# Patient Record
Sex: Female | Born: 1990 | Race: White | Hispanic: No | Marital: Married | Smoking: Current every day smoker
Health system: Southern US, Community
[De-identification: ages and names within clinical notes are randomized; demographics above are authoritative.]

## PROBLEM LIST (undated history)

## (undated) DIAGNOSIS — Z9621 Cochlear implant status: Secondary | ICD-10-CM

## (undated) DIAGNOSIS — F319 Bipolar disorder, unspecified: Secondary | ICD-10-CM

## (undated) DIAGNOSIS — R34 Anuria and oliguria: Secondary | ICD-10-CM

## (undated) DIAGNOSIS — F603 Borderline personality disorder: Secondary | ICD-10-CM

## (undated) DIAGNOSIS — R87629 Unspecified abnormal cytological findings in specimens from vagina: Secondary | ICD-10-CM

## (undated) DIAGNOSIS — F99 Mental disorder, not otherwise specified: Secondary | ICD-10-CM

## (undated) DIAGNOSIS — H9190 Unspecified hearing loss, unspecified ear: Secondary | ICD-10-CM

## (undated) DIAGNOSIS — F1910 Other psychoactive substance abuse, uncomplicated: Secondary | ICD-10-CM

## (undated) DIAGNOSIS — N3010 Interstitial cystitis (chronic) without hematuria: Secondary | ICD-10-CM

## (undated) HISTORY — DX: Cochlear implant status: Z96.21

## (undated) HISTORY — DX: Anuria and oliguria: R34

## (undated) HISTORY — DX: Unspecified abnormal cytological findings in specimens from vagina: R87.629

## (undated) HISTORY — DX: Borderline personality disorder: F60.3

## (undated) HISTORY — PX: COCHLEAR IMPLANT: SUR684

## (undated) HISTORY — DX: Mental disorder, not otherwise specified: F99

## (undated) HISTORY — DX: Unspecified hearing loss, unspecified ear: H91.90

---
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
## 2010-03-03 ENCOUNTER — Ambulatory Visit: Payer: Self-pay | Admitting: Family Medicine

## 2010-03-22 ENCOUNTER — Encounter (INDEPENDENT_AMBULATORY_CARE_PROVIDER_SITE_OTHER): Payer: Self-pay | Admitting: *Deleted

## 2010-03-22 DIAGNOSIS — Z3480 Encounter for supervision of other normal pregnancy, unspecified trimester: Secondary | ICD-10-CM

## 2010-03-22 LAB — CONVERTED CEMR LAB
Basophils Absolute: 0 10*3/uL (ref 0.0–0.1)
Basophils Relative: 0 % (ref 0–1)
Eosinophils Absolute: 0.1 10*3/uL (ref 0.0–0.7)
Eosinophils Relative: 1 % (ref 0–5)
HCT: 38 % (ref 36.0–46.0)
Hepatitis B Surface Ag: NEGATIVE
Lymphs Abs: 2.3 10*3/uL (ref 0.7–4.0)
MCHC: 33.7 g/dL (ref 30.0–36.0)
MCV: 93.4 fL (ref 78.0–100.0)
Platelets: 242 10*3/uL (ref 150–400)
RDW: 12.9 % (ref 11.5–15.5)
Rh Type: POSITIVE

## 2010-03-31 ENCOUNTER — Encounter: Payer: Medicaid Other | Admitting: Obstetrics & Gynecology

## 2010-03-31 ENCOUNTER — Other Ambulatory Visit: Payer: Self-pay | Admitting: Obstetrics and Gynecology

## 2010-03-31 DIAGNOSIS — Z3480 Encounter for supervision of other normal pregnancy, unspecified trimester: Secondary | ICD-10-CM

## 2010-03-31 DIAGNOSIS — Z3682 Encounter for antenatal screening for nuchal translucency: Secondary | ICD-10-CM

## 2010-04-27 ENCOUNTER — Ambulatory Visit (HOSPITAL_COMMUNITY)
Admission: RE | Admit: 2010-04-27 | Discharge: 2010-04-27 | Disposition: A | Discharge status: Home / Self Care | Payer: Medicaid Other | Source: Ambulatory Visit | Attending: Obstetrics and Gynecology | Admitting: Obstetrics and Gynecology

## 2010-04-27 ENCOUNTER — Other Ambulatory Visit: Payer: Self-pay | Admitting: Obstetrics and Gynecology

## 2010-04-27 DIAGNOSIS — O351XX0 Maternal care for (suspected) chromosomal abnormality in fetus, not applicable or unspecified: Secondary | ICD-10-CM | POA: Insufficient documentation

## 2010-04-27 DIAGNOSIS — Z3682 Encounter for antenatal screening for nuchal translucency: Secondary | ICD-10-CM

## 2010-04-27 DIAGNOSIS — Z3689 Encounter for other specified antenatal screening: Secondary | ICD-10-CM | POA: Insufficient documentation

## 2010-04-27 DIAGNOSIS — O3510X0 Maternal care for (suspected) chromosomal abnormality in fetus, unspecified, not applicable or unspecified: Secondary | ICD-10-CM | POA: Insufficient documentation

## 2010-04-28 ENCOUNTER — Encounter: Payer: Medicaid Other | Admitting: Obstetrics & Gynecology

## 2010-04-28 ENCOUNTER — Other Ambulatory Visit: Payer: Self-pay | Admitting: Obstetrics & Gynecology

## 2010-04-28 DIAGNOSIS — Z3400 Encounter for supervision of normal first pregnancy, unspecified trimester: Secondary | ICD-10-CM

## 2010-04-28 DIAGNOSIS — Z0489 Encounter for examination and observation for other specified reasons: Secondary | ICD-10-CM

## 2010-05-18 ENCOUNTER — Other Ambulatory Visit (HOSPITAL_COMMUNITY): Payer: Medicaid Other

## 2010-05-25 ENCOUNTER — Ambulatory Visit (HOSPITAL_COMMUNITY)
Admission: RE | Admit: 2010-05-25 | Discharge: 2010-05-25 | Disposition: A | Discharge status: Home / Self Care | Payer: Medicaid Other | Source: Ambulatory Visit | Attending: Obstetrics & Gynecology | Admitting: Obstetrics & Gynecology

## 2010-05-25 DIAGNOSIS — Z1389 Encounter for screening for other disorder: Secondary | ICD-10-CM | POA: Insufficient documentation

## 2010-05-25 DIAGNOSIS — Z0489 Encounter for examination and observation for other specified reasons: Secondary | ICD-10-CM

## 2010-05-25 DIAGNOSIS — IMO0002 Reserved for concepts with insufficient information to code with codable children: Secondary | ICD-10-CM

## 2010-05-25 DIAGNOSIS — Z363 Encounter for antenatal screening for malformations: Secondary | ICD-10-CM | POA: Insufficient documentation

## 2010-05-25 DIAGNOSIS — O358XX0 Maternal care for other (suspected) fetal abnormality and damage, not applicable or unspecified: Secondary | ICD-10-CM | POA: Insufficient documentation

## 2010-06-01 ENCOUNTER — Encounter (INDEPENDENT_AMBULATORY_CARE_PROVIDER_SITE_OTHER): Payer: Medicaid Other | Admitting: Obstetrics & Gynecology

## 2010-06-01 DIAGNOSIS — Z3480 Encounter for supervision of other normal pregnancy, unspecified trimester: Secondary | ICD-10-CM

## 2010-06-24 ENCOUNTER — Encounter: Payer: Medicaid Other | Admitting: Obstetrics & Gynecology

## 2010-06-30 ENCOUNTER — Encounter (INDEPENDENT_AMBULATORY_CARE_PROVIDER_SITE_OTHER): Payer: Medicaid Other | Admitting: Obstetrics and Gynecology

## 2010-06-30 DIAGNOSIS — Z3480 Encounter for supervision of other normal pregnancy, unspecified trimester: Secondary | ICD-10-CM

## 2010-07-29 ENCOUNTER — Ambulatory Visit (INDEPENDENT_AMBULATORY_CARE_PROVIDER_SITE_OTHER): Payer: Medicaid Other | Admitting: Obstetrics & Gynecology

## 2010-07-29 ENCOUNTER — Encounter: Payer: Self-pay | Admitting: Obstetrics & Gynecology

## 2010-07-29 VITALS — BP 118/65 | Wt 142.0 lb

## 2010-07-29 DIAGNOSIS — O4440 Low lying placenta NOS or without hemorrhage, unspecified trimester: Secondary | ICD-10-CM

## 2010-07-29 DIAGNOSIS — Z3480 Encounter for supervision of other normal pregnancy, unspecified trimester: Secondary | ICD-10-CM

## 2010-07-29 DIAGNOSIS — O4410 Placenta previa with hemorrhage, unspecified trimester: Secondary | ICD-10-CM

## 2010-07-29 NOTE — Patient Instructions (Signed)
Pregnancy - Third Trimester The third trimester of pregnancy (the last 3 months) is a period of the most rapid growth for you and your baby. The baby approaches a length of 20 inches and a weight of 6 to 10 pounds. The baby is adding on fat and getting ready for life outside your body. While inside, babies have periods of sleeping and waking, suck their thumbs, and hiccups. You can often feel small contractions of the uterus. This is false labor. It is also called Braxton-Hicks contractions. This is like a practice for labor. The usual problems in this stage of pregnancy include more difficulty breathing, swelling of the hands and feet from water retention, and having to urinate more often because of the uterus and baby pressing on your bladder.  PRENATAL EXAMS  Blood work may continue to be done during prenatal exams. These tests are done to check on your health and the probable health of your baby. Blood work is used to follow your blood levels (hemoglobin). Anemia (low hemoglobin) is common during pregnancy. Iron and vitamins are given to help prevent this. You may also continue to be checked for diabetes. Some of the past blood tests may be done again.   The size of the uterus is measured during each visit. This makes sure your baby is growing properly according to your pregnancy dates.   Your blood pressure is checked every prenatal visit. This is to make sure you are not getting toxemia.   Your urine is checked every prenatal visit for infection, diabetes and protein.   Your weight is checked at each visit. This is done to make sure gains are happening at the suggested rate and that you and your baby are growing normally.   Sometimes, an ultrasound is performed to confirm the position and the proper growth and development of the baby. This is a test done that bounces harmless sound waves off the baby so your caregiver can more accurately determine due dates.   Discuss the type of pain  medication and anesthesia you will have during your labor and delivery.   Discuss the possibility and anesthesia if a Cesarean Section might be necessary.   Inform your caregiver if there is any mental or physical violence at home.  Sometimes, a specialized non-stress test, contraction stress test and biophysical profile are done to make sure the baby is not having a problem. Checking the amniotic fluid surrounding the baby is called an amniocentesis. The amniotic fluid is removed by sticking a needle into the belly (abdomen). This is sometimes done near the end of pregnancy if an early delivery is required. In this case, it is done to help make sure the baby's lungs are mature enough for the baby to live outside of the womb. If the lungs are not mature and it is unsafe to deliver the baby, an injection of cortisone medication is given to the mother 1 to 2 days before the delivery. This helps the baby's lungs mature and makes it safer to deliver the baby. CHANGES OCCURING IN THE THIRD TRIMESTER OF PREGNANCY Your body goes through many changes during pregnancy. They vary from person to person. Talk to your caregiver about changes you notice and are concerned about.  During the last trimester, you have probably had an increase in your appetite. It is normal to have cravings for certain foods. This varies from person to person and pregnancy to pregnancy.   You may begin to get stretch marks on your hips,   abdomen, and breasts. These are normal changes in the body during pregnancy. There are no exercises or medications to take which prevent this change.   Constipation may be treated with a stool softener or adding bulk to your diet. Drinking lots of fluids, fiber in vegetables, fruits, and whole grains are helpful.   Exercising is also helpful. If you have been very active up until your pregnancy, most of these activities can be continued during your pregnancy. If you have been less active, it is helpful  to start an exercise program such as walking. Consult your caregiver before starting exercise programs.   Avoid all smoking, alcohol, un-prescribed drugs, herbs and "street drugs" during your pregnancy. These chemicals affect the formation and growth of the baby. Avoid chemicals throughout the pregnancy to ensure the delivery of a healthy infant.   Backache, varicose veins and hemorrhoids may develop or get worse.   You will tire more easily in the third trimester, which is normal.   The baby's movements may be stronger and more often.   You may become short of breath easily.   Your belly button may stick out.   A yellow discharge may leak from your breasts called colostrum.   You may have a bloody mucus discharge. This usually occurs a few days to a week before labor begins.  HOME CARE INSTRUCTIONS  Keep your caregiver's appointments. Follow your caregiver's instructions regarding medication use, exercise, and diet.   During pregnancy, you are providing food for you and your baby. Continue to eat regular, well-balanced meals. Choose foods such as meat, fish, milk and other low fat dairy products, vegetables, fruits, and whole-grain breads and cereals. Your caregiver will tell you of the ideal weight gain.   A physical sexual relationship may be continued throughout pregnancy if there are no other problems such as early (premature) leaking of amniotic fluid from the membranes, vaginal bleeding, or belly (abdominal) pain.   Exercise regularly if there are no restrictions. Check with your caregiver if you are unsure of the safety of your exercises. Greater weight gain will occur in the last 2 trimesters of pregnancy. Exercising helps:   Control your weight.   Get you in shape for labor and delivery.   You lose weight after you deliver.   Rest a lot with legs elevated, or as needed for leg cramps or low back pain.   Wear a good support or jogging bra for breast tenderness during  pregnancy. This may help if worn during sleep. Pads or tissues may be used in the bra if you are leaking colostrum.   Do not use hot tubs, steam rooms, or saunas.   Wear your seat belt when driving. This protects you and your baby if you are in an accident.   Avoid raw meat, cat litter boxes and soil used by cats. These carry germs that can cause birth defects in the baby.   It is easier to loose urine during pregnancy. Tightening up and strengthening the pelvic muscles will help with this problem. You can practice stopping your urination while you are going to the bathroom. These are the same muscles you need to strengthen. It is also the muscles you would use if you were trying to stop from passing gas. You can practice tightening these muscles up 10 times a set and repeating this about 3 times per day. Once you know what muscles to tighten up, do not perform these exercises during urination. It is more likely to  cause an infection by backing up the urine.   Ask for help if you have financial, counseling or nutritional needs during pregnancy. Your caregiver will be able to offer counseling for these needs as well as refer you for other special needs.   Make a list of emergency phone numbers and have them available.   Plan on getting help from family or friends when you go home from the hospital.   Make a trial run to the hospital.   Take prenatal classes with the father to understand, practice and ask questions about the labor and delivery.   Prepare the baby's room/nursery.   Do not travel out of the city unless it is absolutely necessary and with the advice of your caregiver.   Wear only low or no heal shoes to have better balance and prevent falling.  MEDICATIONS AND DRUG USE IN PREGNANCY  Take prenatal vitamins as directed. The vitamin should contain 1 milligram of folic acid. Keep all vitamins out of reach of children. Only a couple vitamins or tablets containing iron may be fatal  to a baby or young child when ingested.   Avoid use of all medications, including herbs, over-the-counter medications, not prescribed or suggested by your caregiver. Only take over-the-counter or prescription medicines for pain, discomfort, or fever as directed by your caregiver. Do not use aspirin, ibuprofen (Motrin, Advil, Nuprin) or naproxen (Aleve) unless OK'd by your caregiver.   Let your caregiver also know about herbs you may be using.   Alcohol is related to a number of birth defects. This includes fetal alcohol syndrome. All alcohol, in any form, should be avoided completely. Smoking will cause low birth rate and premature babies.   Street/illegal drugs are very harmful to the baby. They are absolutely forbidden. A baby born to an addicted mother will be addicted at birth. The baby will go through the same withdrawal an adult does.  SEEK MEDICAL CARE IF  You have any concerns or worries during your pregnancy. It is better to call with your questions if you feel they cannot wait, rather than worry about them.  DECISIONS ABOUT CIRCUMCISION You may or may not know the sex of your baby. If you know your baby is a boy, it may be time to think about circumcision. Circumcision is the removal of the foreskin of the penis. This is the skin that covers the sensitive end of the penis. There is no proven medical need for this. Often this decision is made on what is popular at the time or based upon religious beliefs and social issues. You can discuss these issues with your caregiver or pediatrician. SEEK IMMEDIATE MEDICAL CARE IF:  An unexplained oral temperature above 101F develops, or as your caregiver suggests.   You have leaking of fluid from the vagina (birth canal). If leaking membranes are suspected, take your temperature and tell your caregiver of this when you call.   There is vaginal spotting, bleeding or passing clots. Tell your caregiver of the amount and how many pads are used.    You develop a bad smelling vaginal discharge with a change in the color from clear to white.   You develop vomiting that lasts more than 24 hours.   You develop chills or fever.   You develop shortness of breath.   You develop burning on urination.   You loose more than 2 pounds of weight or gain more than 2 pounds of weight or as suggested by your caregiver.  You notice sudden swelling of your face, hands, and feet or legs.   You develop belly (abdominal) pain. Round ligament discomfort is a common non-cancerous (benign) cause of abdominal pain in pregnancy. Your caregiver still must evaluate you.   You develop a severe headache that does not go away.   You develop visual problems, blurred or double vision.   If you have not felt your baby move for more than 1 hour. If you think the baby is not moving as much as usual, eat something with sugar in it and lie down on your left side for an hour. The baby should move at least 4 to 5 times per hour. Call right away if your baby moves less than that.   You fall, are in a car accident or any kind of trauma.   There is mental or physical violence at home.  Document Released: 12/28/2000 Document Re-Released: 10/31/2008 [facility] Patient Information 2011 [place], [place].

## 2010-07-29 NOTE — Progress Notes (Signed)
No complaints.  FM/PTL precautions reviewed. Will obtain u/s to follow up low lying placenta.  1 hr GTT, third trimester labs next visit.

## 2010-08-04 ENCOUNTER — Ambulatory Visit (HOSPITAL_COMMUNITY)
Admission: RE | Admit: 2010-08-04 | Discharge: 2010-08-04 | Disposition: A | Discharge status: Home / Self Care | Payer: Medicaid Other | Source: Ambulatory Visit | Attending: Obstetrics & Gynecology | Admitting: Obstetrics & Gynecology

## 2010-08-04 DIAGNOSIS — Z3689 Encounter for other specified antenatal screening: Secondary | ICD-10-CM | POA: Insufficient documentation

## 2010-08-04 DIAGNOSIS — O4440 Low lying placenta NOS or without hemorrhage, unspecified trimester: Secondary | ICD-10-CM

## 2010-08-11 ENCOUNTER — Ambulatory Visit (INDEPENDENT_AMBULATORY_CARE_PROVIDER_SITE_OTHER): Payer: Medicaid Other | Admitting: Obstetrics & Gynecology

## 2010-08-11 ENCOUNTER — Other Ambulatory Visit: Payer: Self-pay | Admitting: Obstetrics & Gynecology

## 2010-08-11 DIAGNOSIS — Z3480 Encounter for supervision of other normal pregnancy, unspecified trimester: Secondary | ICD-10-CM

## 2010-08-11 DIAGNOSIS — Z3400 Encounter for supervision of normal first pregnancy, unspecified trimester: Secondary | ICD-10-CM

## 2010-08-11 LAB — CBC
Hemoglobin: 11.4 g/dL — ABNORMAL LOW (ref 12.0–15.0)
RBC: 3.61 MIL/uL — ABNORMAL LOW (ref 3.87–5.11)

## 2010-08-12 LAB — HIV ANTIBODY (ROUTINE TESTING W REFLEX): HIV: NONREACTIVE

## 2010-08-12 LAB — RPR

## 2010-09-01 ENCOUNTER — Ambulatory Visit (INDEPENDENT_AMBULATORY_CARE_PROVIDER_SITE_OTHER): Payer: Medicaid Other | Admitting: Obstetrics & Gynecology

## 2010-09-01 DIAGNOSIS — O99330 Smoking (tobacco) complicating pregnancy, unspecified trimester: Secondary | ICD-10-CM

## 2010-09-01 DIAGNOSIS — Z3480 Encounter for supervision of other normal pregnancy, unspecified trimester: Secondary | ICD-10-CM

## 2010-09-01 DIAGNOSIS — N760 Acute vaginitis: Secondary | ICD-10-CM

## 2010-09-03 LAB — WET PREP, GENITAL: Trich, Wet Prep: NONE SEEN

## 2010-09-03 MED ORDER — FLUCONAZOLE 150 MG PO TABS: 150.0000 mg | ORAL_TABLET | Freq: Once | ORAL | Status: AC

## 2010-09-03 NOTE — Progress Notes (Signed)
Addended by: Jaynie Collins A on: 09/03/2010 08:24 PM   Modules accepted: Orders

## 2010-09-13 ENCOUNTER — Other Ambulatory Visit: Payer: Self-pay | Admitting: Gynecology

## 2010-09-13 DIAGNOSIS — B373 Candidiasis of vulva and vagina: Secondary | ICD-10-CM

## 2010-09-13 MED ORDER — FLUCONAZOLE 100 MG PO TABS: 150.0000 mg | ORAL_TABLET | Freq: Every day | ORAL | Status: AC

## 2010-09-16 ENCOUNTER — Encounter (INDEPENDENT_AMBULATORY_CARE_PROVIDER_SITE_OTHER): Payer: Medicaid Other | Admitting: Obstetrics & Gynecology

## 2010-09-16 DIAGNOSIS — E669 Obesity, unspecified: Secondary | ICD-10-CM

## 2010-09-16 DIAGNOSIS — Z3480 Encounter for supervision of other normal pregnancy, unspecified trimester: Secondary | ICD-10-CM

## 2010-09-29 ENCOUNTER — Ambulatory Visit (INDEPENDENT_AMBULATORY_CARE_PROVIDER_SITE_OTHER): Payer: Medicaid Other | Admitting: Obstetrics & Gynecology

## 2010-09-29 DIAGNOSIS — Z3480 Encounter for supervision of other normal pregnancy, unspecified trimester: Secondary | ICD-10-CM

## 2010-09-29 DIAGNOSIS — Z3400 Encounter for supervision of normal first pregnancy, unspecified trimester: Secondary | ICD-10-CM

## 2010-09-29 DIAGNOSIS — O99330 Smoking (tobacco) complicating pregnancy, unspecified trimester: Secondary | ICD-10-CM

## 2010-09-30 LAB — GC/CHLAMYDIA PROBE AMP, GENITAL
Chlamydia, DNA Probe: NEGATIVE
GC Probe Amp, Genital: NEGATIVE

## 2010-10-01 LAB — STREP B DNA PROBE: GBSP: NEGATIVE

## 2010-10-06 ENCOUNTER — Encounter (INDEPENDENT_AMBULATORY_CARE_PROVIDER_SITE_OTHER): Payer: Medicaid Other | Admitting: Obstetrics and Gynecology

## 2010-10-06 DIAGNOSIS — Z3480 Encounter for supervision of other normal pregnancy, unspecified trimester: Secondary | ICD-10-CM

## 2010-10-06 DIAGNOSIS — O99330 Smoking (tobacco) complicating pregnancy, unspecified trimester: Secondary | ICD-10-CM

## 2010-10-11 ENCOUNTER — Encounter: Payer: Medicaid Other | Admitting: Obstetrics & Gynecology

## 2010-10-18 ENCOUNTER — Encounter (INDEPENDENT_AMBULATORY_CARE_PROVIDER_SITE_OTHER): Payer: Medicaid Other | Admitting: Obstetrics & Gynecology

## 2010-10-18 DIAGNOSIS — Z3400 Encounter for supervision of normal first pregnancy, unspecified trimester: Secondary | ICD-10-CM

## 2010-10-26 ENCOUNTER — Encounter (INDEPENDENT_AMBULATORY_CARE_PROVIDER_SITE_OTHER): Payer: Medicaid Other | Admitting: Obstetrics & Gynecology

## 2010-10-26 DIAGNOSIS — O99330 Smoking (tobacco) complicating pregnancy, unspecified trimester: Secondary | ICD-10-CM

## 2010-10-26 DIAGNOSIS — Z3480 Encounter for supervision of other normal pregnancy, unspecified trimester: Secondary | ICD-10-CM

## 2010-10-28 ENCOUNTER — Inpatient Hospital Stay (HOSPITAL_COMMUNITY)
Admission: AD | Admit: 2010-10-28 | Discharge: 2010-11-01 | DRG: 765 | Disposition: A | Discharge status: Home / Self Care | Payer: Medicaid Other | Source: Ambulatory Visit | Attending: Obstetrics & Gynecology | Admitting: Obstetrics & Gynecology

## 2010-10-28 ENCOUNTER — Other Ambulatory Visit (INDEPENDENT_AMBULATORY_CARE_PROVIDER_SITE_OTHER): Payer: Medicaid Other | Admitting: Family Medicine

## 2010-10-28 ENCOUNTER — Encounter (HOSPITAL_COMMUNITY): Payer: Self-pay | Admitting: Anesthesiology

## 2010-10-28 ENCOUNTER — Encounter (HOSPITAL_COMMUNITY): Payer: Self-pay | Admitting: *Deleted

## 2010-10-28 DIAGNOSIS — O411090 Infection of amniotic sac and membranes, unspecified, unspecified trimester, not applicable or unspecified: Secondary | ICD-10-CM | POA: Diagnosis present

## 2010-10-28 DIAGNOSIS — O480 Post-term pregnancy: Secondary | ICD-10-CM

## 2010-10-28 DIAGNOSIS — T7840XA Allergy, unspecified, initial encounter: Secondary | ICD-10-CM | POA: Diagnosis not present

## 2010-10-28 DIAGNOSIS — Z91040 Latex allergy status: Secondary | ICD-10-CM

## 2010-10-28 DIAGNOSIS — O99893 Other specified diseases and conditions complicating puerperium: Secondary | ICD-10-CM | POA: Diagnosis not present

## 2010-10-28 DIAGNOSIS — O324XX0 Maternal care for high head at term, not applicable or unspecified: Secondary | ICD-10-CM | POA: Diagnosis present

## 2010-10-28 DIAGNOSIS — IMO0002 Reserved for concepts with insufficient information to code with codable children: Secondary | ICD-10-CM | POA: Diagnosis not present

## 2010-10-28 DIAGNOSIS — Z3400 Encounter for supervision of normal first pregnancy, unspecified trimester: Secondary | ICD-10-CM

## 2010-10-28 HISTORY — DX: Bipolar disorder, unspecified: F31.9

## 2010-10-28 LAB — CBC
HCT: 37.4 % (ref 36.0–46.0)
Hemoglobin: 12.6 g/dL (ref 12.0–15.0)
MCH: 32.2 pg (ref 26.0–34.0)
MCHC: 33.7 g/dL (ref 30.0–36.0)
RBC: 3.91 MIL/uL (ref 3.87–5.11)

## 2010-10-28 MED ORDER — LACTATED RINGERS IV SOLN
500.0000 mL | INTRAVENOUS | Status: DC | PRN
Start: 2010-10-28 — End: 2010-10-30

## 2010-10-28 MED ORDER — HYDROXYZINE HCL 50 MG PO TABS
50.0000 mg | ORAL_TABLET | Freq: Four times a day (QID) | ORAL | Status: DC | PRN
  Filled 2010-10-28: qty 1

## 2010-10-28 MED ORDER — LACTATED RINGERS IV SOLN
INTRAVENOUS | Status: DC | PRN
  Administered 2010-10-28 – 2010-10-29 (×9): via INTRAVENOUS

## 2010-10-28 MED ORDER — EPHEDRINE 5 MG/ML INJ
10.0000 mg | INTRAVENOUS | Status: DC | PRN
  Filled 2010-10-28 (×2): qty 4

## 2010-10-28 MED ORDER — NALBUPHINE SYRINGE 5 MG/0.5 ML
5.0000 mg | INJECTION | INTRAMUSCULAR | Status: DC | PRN
  Administered 2010-10-28: 10 mg via INTRAVENOUS
  Filled 2010-10-28: qty 1

## 2010-10-28 MED ORDER — DIPHENHYDRAMINE HCL 50 MG/ML IJ SOLN: 12.5000 mg | INTRAMUSCULAR | Status: DC | PRN

## 2010-10-28 MED ORDER — LIDOCAINE HCL (PF) 1 % IJ SOLN
30.0000 mL | INTRAMUSCULAR | Status: DC | PRN
  Filled 2010-10-28 (×2): qty 30

## 2010-10-28 MED ORDER — HYDROXYZINE HCL 50 MG/ML IM SOLN
50.0000 mg | Freq: Four times a day (QID) | INTRAMUSCULAR | Status: DC | PRN
  Filled 2010-10-28: qty 1

## 2010-10-28 MED ORDER — ONDANSETRON HCL 4 MG/2ML IJ SOLN: 4.0000 mg | Freq: Four times a day (QID) | INTRAMUSCULAR | Status: DC | PRN

## 2010-10-28 MED ORDER — CITRIC ACID-SODIUM CITRATE 334-500 MG/5ML PO SOLN
30.0000 mL | ORAL | Status: DC | PRN
  Administered 2010-10-29: 30 mL via ORAL
  Filled 2010-10-28: qty 15

## 2010-10-28 MED ORDER — FENTANYL 2.5 MCG/ML BUPIVACAINE 1/10 % EPIDURAL INFUSION (WH - ANES)
14.0000 mL/h | INTRAMUSCULAR | Status: DC
  Administered 2010-10-29 (×6): 14 mL/h via EPIDURAL
  Filled 2010-10-28 (×7): qty 60

## 2010-10-28 MED ORDER — PHENYLEPHRINE 40 MCG/ML (10ML) SYRINGE FOR IV PUSH (FOR BLOOD PRESSURE SUPPORT)
80.0000 ug | PREFILLED_SYRINGE | INTRAVENOUS | Status: DC | PRN
  Filled 2010-10-28 (×2): qty 5

## 2010-10-28 MED ORDER — OXYTOCIN BOLUS FROM INFUSION
500.0000 mL | Freq: Once | INTRAVENOUS | Status: DC
  Filled 2010-10-28: qty 1000
  Filled 2010-10-28: qty 500

## 2010-10-28 MED ORDER — LACTATED RINGERS IV SOLN: 500.0000 mL | Freq: Once | INTRAVENOUS | Status: DC

## 2010-10-28 MED ORDER — OXYTOCIN 20 UNITS IN LACTATED RINGERS INFUSION - SIMPLE: 125.0000 mL/h | Freq: Once | INTRAVENOUS | Status: DC

## 2010-10-28 MED ORDER — EPHEDRINE 5 MG/ML INJ
10.0000 mg | INTRAVENOUS | Status: DC | PRN
  Filled 2010-10-28: qty 4

## 2010-10-28 MED ORDER — FLEET ENEMA 7-19 GM/118ML RE ENEM: 1.0000 | ENEMA | RECTAL | Status: DC | PRN

## 2010-10-28 MED ORDER — PHENYLEPHRINE 40 MCG/ML (10ML) SYRINGE FOR IV PUSH (FOR BLOOD PRESSURE SUPPORT)
80.0000 ug | PREFILLED_SYRINGE | INTRAVENOUS | Status: DC | PRN
  Filled 2010-10-28: qty 5

## 2010-10-28 NOTE — Progress Notes (Signed)
Pt states she was seen at [facility] this am and was 3.5 cm and had membranes stripped. Has been having contractions every 3-4 minutes with a little bloody show. Reports fetal movement but not as much as usual.

## 2010-10-28 NOTE — Progress Notes (Signed)
Pt returned to monitor after being off the monitor x 1hour.. Pt request recheck at  This time

## 2010-10-28 NOTE — Anesthesia Preprocedure Evaluation (Addendum)
Anesthesia Evaluation  Name, MR# and DOB Patient awake  General Assessment Comment  Reviewed: Allergy & Precautions, H&P , NPO status , Patient's Chart, lab work & pertinent test results  Airway Mallampati: II TM Distance: >3 FB Neck ROM: full    Dental No notable dental hx.    Pulmonary    Pulmonary exam normal       Cardiovascular     Neuro/Psych Negative Neurological ROS  Negative Psych ROS   GI/Hepatic negative GI ROS Neg liver ROS    Endo/Other  Negative Endocrine ROS  Renal/GU negative Renal ROS  Genitourinary negative   Musculoskeletal negative musculoskeletal ROS (+)   Abdominal Normal abdominal exam  (+)   Peds negative pediatric ROS (+)  Hematology negative hematology ROS (+)   Anesthesia Other Findings   Reproductive/Obstetrics (+) Pregnancy                           Anesthesia Physical Anesthesia Plan  ASA: II  Anesthesia Plan: Epidural   Post-op Pain Management:    Induction:   Airway Management Planned:   Additional Equipment:   Intra-op Plan:   Post-operative Plan:   Informed Consent: I have reviewed the patients History and Physical, chart, labs and discussed the procedure including the risks, benefits and alternatives for the proposed anesthesia with the patient or authorized representative who has indicated his/her understanding and acceptance.     Plan Discussed with:   Anesthesia Plan Comments:         Anesthesia Quick Evaluation

## 2010-10-29 ENCOUNTER — Inpatient Hospital Stay (HOSPITAL_COMMUNITY): Payer: Medicaid Other | Admitting: Anesthesiology

## 2010-10-29 ENCOUNTER — Encounter (HOSPITAL_COMMUNITY): Payer: Self-pay | Admitting: *Deleted

## 2010-10-29 ENCOUNTER — Encounter (HOSPITAL_COMMUNITY)
Admission: AD | Disposition: A | Discharge status: Home / Self Care | Payer: Self-pay | Source: Ambulatory Visit | Attending: Obstetrics & Gynecology

## 2010-10-29 ENCOUNTER — Encounter (HOSPITAL_COMMUNITY): Payer: Self-pay | Admitting: Anesthesiology

## 2010-10-29 ENCOUNTER — Other Ambulatory Visit: Payer: Self-pay | Admitting: Obstetrics & Gynecology

## 2010-10-29 DIAGNOSIS — O411090 Infection of amniotic sac and membranes, unspecified, unspecified trimester, not applicable or unspecified: Secondary | ICD-10-CM

## 2010-10-29 LAB — RPR: RPR Ser Ql: NONREACTIVE

## 2010-10-29 SURGERY — Surgical Case
Anesthesia: Epidural | Site: Abdomen | Wound class: Clean Contaminated

## 2010-10-29 MED ORDER — OXYTOCIN 20 UNITS IN LACTATED RINGERS INFUSION - SIMPLE
1.0000 m[IU]/min | INTRAVENOUS | Status: DC
  Administered 2010-10-29: 3 m[IU]/min via INTRAVENOUS
  Administered 2010-10-29: 1 m[IU]/min via INTRAVENOUS
  Administered 2010-10-29: 2 m[IU]/min via INTRAVENOUS

## 2010-10-29 MED ORDER — MORPHINE SULFATE 0.5 MG/ML IJ SOLN
INTRAMUSCULAR | Status: AC
  Filled 2010-10-29: qty 10

## 2010-10-29 MED ORDER — EPHEDRINE 5 MG/ML INJ
INTRAVENOUS | Status: AC
  Filled 2010-10-29: qty 10

## 2010-10-29 MED ORDER — MORPHINE SULFATE (PF) 0.5 MG/ML IJ SOLN
INTRAMUSCULAR | Status: DC | PRN
  Administered 2010-10-29: 2 mg via INTRAVENOUS

## 2010-10-29 MED ORDER — TERBUTALINE SULFATE 1 MG/ML IJ SOLN: 0.2500 mg | Freq: Once | INTRAMUSCULAR | Status: AC | PRN

## 2010-10-29 MED ORDER — ONDANSETRON HCL 4 MG/2ML IJ SOLN
INTRAMUSCULAR | Status: AC
  Filled 2010-10-29: qty 2

## 2010-10-29 MED ORDER — OXYTOCIN 20 UNITS IN LACTATED RINGERS INFUSION - SIMPLE
INTRAVENOUS | Status: DC | PRN
  Administered 2010-10-29 (×2): 20 [IU] via INTRAVENOUS

## 2010-10-29 MED ORDER — PHENYLEPHRINE 40 MCG/ML (10ML) SYRINGE FOR IV PUSH (FOR BLOOD PRESSURE SUPPORT)
PREFILLED_SYRINGE | INTRAVENOUS | Status: AC
  Filled 2010-10-29: qty 5

## 2010-10-29 MED ORDER — GENTAMICIN SULFATE 40 MG/ML IJ SOLN
150.0000 mg | Freq: Three times a day (TID) | INTRAVENOUS | Status: DC
  Administered 2010-10-29: 150 mg via INTRAVENOUS
  Filled 2010-10-29 (×4): qty 3.75

## 2010-10-29 MED ORDER — MEPERIDINE HCL 25 MG/ML IJ SOLN
INTRAMUSCULAR | Status: DC | PRN
  Administered 2010-10-29: 25 mg via INTRAVENOUS

## 2010-10-29 MED ORDER — OXYTOCIN 10 UNIT/ML IJ SOLN
INTRAMUSCULAR | Status: AC
  Filled 2010-10-29: qty 2

## 2010-10-29 MED ORDER — FENTANYL CITRATE 0.05 MG/ML IJ SOLN
INTRAMUSCULAR | Status: AC
Start: 2010-10-29 — End: 2010-10-29
  Filled 2010-10-29: qty 2

## 2010-10-29 MED ORDER — SODIUM BICARBONATE 8.4 % IV SOLN
INTRAVENOUS | Status: DC | PRN
  Administered 2010-10-29: 10 mL via EPIDURAL

## 2010-10-29 MED ORDER — LIDOCAINE-EPINEPHRINE (PF) 2 %-1:200000 IJ SOLN
INTRAMUSCULAR | Status: AC
  Filled 2010-10-29: qty 20

## 2010-10-29 MED ORDER — ONDANSETRON HCL 4 MG/2ML IJ SOLN
INTRAMUSCULAR | Status: DC | PRN
  Administered 2010-10-29: 4 mg via INTRAVENOUS

## 2010-10-29 MED ORDER — MORPHINE SULFATE (PF) 0.5 MG/ML IJ SOLN
INTRAMUSCULAR | Status: DC | PRN
  Administered 2010-10-29: 3 mg via EPIDURAL

## 2010-10-29 MED ORDER — SODIUM BICARBONATE 8.4 % IV SOLN
INTRAVENOUS | Status: AC
  Filled 2010-10-29: qty 50

## 2010-10-29 MED ORDER — LIDOCAINE HCL 1.5 % IJ SOLN
INTRAMUSCULAR | Status: DC | PRN
  Administered 2010-10-29 (×2): 5 mL via EPIDURAL

## 2010-10-29 MED ORDER — SODIUM CHLORIDE 0.9 % IV SOLN
2.0000 g | Freq: Four times a day (QID) | INTRAVENOUS | Status: DC
  Administered 2010-10-29 (×2): 2 g via INTRAVENOUS
  Filled 2010-10-29 (×5): qty 2000

## 2010-10-29 MED ORDER — FENTANYL 2.5 MCG/ML BUPIVACAINE 1/10 % EPIDURAL INFUSION (WH - ANES)
INTRAMUSCULAR | Status: DC | PRN
  Administered 2010-10-29: 14 mL/h via EPIDURAL

## 2010-10-29 MED ORDER — MEPERIDINE HCL 25 MG/ML IJ SOLN
INTRAMUSCULAR | Status: AC
  Filled 2010-10-29: qty 1

## 2010-10-29 MED ORDER — METHYLERGONOVINE MALEATE 0.2 MG/ML IJ SOLN
INTRAMUSCULAR | Status: DC | PRN
  Administered 2010-10-29: 0.2 mg via INTRAMUSCULAR

## 2010-10-29 MED ORDER — MEPERIDINE HCL 25 MG/ML IJ SOLN: 6.2500 mg | INTRAMUSCULAR | Status: DC | PRN

## 2010-10-29 MED ORDER — SCOPOLAMINE 1 MG/3DAYS TD PT72
1.0000 | MEDICATED_PATCH | Freq: Once | TRANSDERMAL | Status: DC
  Administered 2010-10-30: 1.5 mg via TRANSDERMAL

## 2010-10-29 MED ORDER — HYDROMORPHONE HCL 1 MG/ML IJ SOLN
0.2500 mg | INTRAMUSCULAR | Status: DC | PRN
  Administered 2010-10-30: 0.5 mg via INTRAVENOUS

## 2010-10-29 MED ORDER — FENTANYL CITRATE 0.05 MG/ML IJ SOLN
INTRAMUSCULAR | Status: DC | PRN
  Administered 2010-10-29: 100 ug via INTRAVENOUS

## 2010-10-29 MED ORDER — SODIUM CHLORIDE 0.9 % IR SOLN
Status: DC | PRN
  Administered 2010-10-29: 1000 mL

## 2010-10-29 MED ORDER — ONDANSETRON HCL 4 MG/2ML IJ SOLN: 4.0000 mg | Freq: Once | INTRAMUSCULAR | Status: AC | PRN

## 2010-10-29 SURGICAL SUPPLY — 39 items
CLOTH BEACON ORANGE TIMEOUT ST (SAFETY) ×2 IMPLANT
DERMABOND ADVANCED (GAUZE/BANDAGES/DRESSINGS) ×1
DERMABOND ADVANCED .7 DNX12 (GAUZE/BANDAGES/DRESSINGS) ×1 IMPLANT
DRSG COVADERM 4X8 (GAUZE/BANDAGES/DRESSINGS) ×2 IMPLANT
DURAPREP 26ML APPLICATOR (WOUND CARE) ×4 IMPLANT
ELECT REM PT RETURN 9FT ADLT (ELECTROSURGICAL) ×2
ELECTRODE REM PT RTRN 9FT ADLT (ELECTROSURGICAL) ×1 IMPLANT
EXTRACTOR VACUUM BELL STYLE (SUCTIONS) IMPLANT
GLOVE BIOGEL PI IND STRL 7.0 (GLOVE) ×1 IMPLANT
GLOVE BIOGEL PI IND STRL 8 (GLOVE) ×2 IMPLANT
GLOVE BIOGEL PI INDICATOR 7.0 (GLOVE) ×1
GLOVE BIOGEL PI INDICATOR 8 (GLOVE) ×2
GLOVE ECLIPSE 8.0 STRL XLNG CF (GLOVE) IMPLANT
GLOVE SKINSENSE NS SZ6.5 (GLOVE) ×2
GLOVE SKINSENSE NS SZ7.5 (GLOVE) ×1
GLOVE SKINSENSE NS SZ8.0 LF (GLOVE) ×2
GLOVE SKINSENSE STRL SZ6.5 (GLOVE) ×2 IMPLANT
GLOVE SKINSENSE STRL SZ7.5 (GLOVE) ×1 IMPLANT
GLOVE SKINSENSE STRL SZ8.0 LF (GLOVE) ×2 IMPLANT
GOWN STRL REIN XL XLG (GOWN DISPOSABLE) ×6 IMPLANT
KIT ABG SYR 3ML LUER SLIP (SYRINGE) ×2 IMPLANT
NEEDLE HYPO 25X5/8 SAFETYGLIDE (NEEDLE) ×2 IMPLANT
NS IRRIG 1000ML POUR BTL (IV SOLUTION) ×2 IMPLANT
PACK C SECTION WH (CUSTOM PROCEDURE TRAY) ×2 IMPLANT
RTRCTR C-SECT PINK 25CM LRG (MISCELLANEOUS) IMPLANT
SLEEVE SCD COMPRESS KNEE MED (MISCELLANEOUS) ×2 IMPLANT
STAPLER VISISTAT 35W (STAPLE) ×2 IMPLANT
SUT CHROMIC 0 CT 1 (SUTURE) ×2 IMPLANT
SUT MNCRL 0 VIOLET CTX 36 (SUTURE) ×2 IMPLANT
SUT MONOCRYL 0 CTX 36 (SUTURE) ×2
SUT PLAIN 2 0 (SUTURE)
SUT PLAIN 2 0 XLH (SUTURE) IMPLANT
SUT PLAIN ABS 2-0 CT1 27XMFL (SUTURE) IMPLANT
SUT VIC AB 0 CTX 36 (SUTURE) ×1
SUT VIC AB 0 CTX36XBRD ANBCTRL (SUTURE) ×1 IMPLANT
SUT VIC AB 4-0 KS 27 (SUTURE) ×2 IMPLANT
TOWEL OR 17X24 6PK STRL BLUE (TOWEL DISPOSABLE) ×4 IMPLANT
TRAY FOLEY CATH 14FR (SET/KITS/TRAYS/PACK) IMPLANT
WATER STERILE IRR 1000ML POUR (IV SOLUTION) IMPLANT

## 2010-10-29 NOTE — Consult Note (Signed)
ANTIBIOTIC CONSULT NOTE - INITIAL  Pharmacy Consult for Gentamicin Indication: Suspected Chorioamnionitis  Allergies  Allergen Reactions  . Peanuts (Nuts) Anaphylaxis  . Latex Rash  . Nsaids Rash  . Tylenol (Acetaminophen) Itching and Rash    Patient Measurements: Height: 5' 1.5" (156.2 cm) Weight: 170 lb 3.2 oz (77.202 kg) IBW/kg (Calculated) : 48.95  Adjusted Body Weight: 57.4kg  Vital Signs: Temp: 100.7 F (38.2 C) (10/12 1615) Temp src: Axillary (10/12 1615) BP: 134/78 mmHg (10/12 1615) Pulse Rate: 121  (10/12 1615)   Basename 10/28/10 1935  WBC 17.1*  HGB 12.6  PLT 170  LABCREA --  CREATININE --  Used estimated Screatinine for calculations: 0.6  CrCl > 16ml/min  Medical History: Past Medical History  Diagnosis Date  . No pertinent past medical history   . Bipolar 1 disorder     Medications:  Ampicillin 2 IV q6h Assessment: 20 yo with maternal temp during labor-- suspected chorio.  Goal of Therapy:  Gentamicin peaks 6-53mcg/ml  Trough < 70mcg/ml   Plan:  1. Gentamicin 150mg  IV q8h. 2. Will check Screatinine if Gentamicin continued postpartum. 3. Will continue to follow. Thanks!    Claybon Jabs 10/29/2010,4:23 PM

## 2010-10-29 NOTE — Progress Notes (Signed)
Tammy Snyder is a 20 y.o. G2P0100 at [redacted]w[redacted]d admitted for active labor Patient has been pushing for about 1.5 hours.  Objective: BP 116/74  Pulse 111  Temp(Src) 100.5 F (38.1 C) (Axillary)  Resp 18  Ht 5' 1.5" (1.562 m)  Wt 77.202 kg (170 lb 3.2 oz)  BMI 31.64 kg/m2  SpO2 99%  LMP 01/11/2010 I/O last 3 completed shifts: In: -  Out: 850 [Urine:850]    FHT:  FHR: 150s bpm, variability: moderate,  accelerations:  Present,  decelerations:  Present occasional variables seen and   UC:   regular, every 1-3 minutes SVE:   Dilation: 10 Effacement (%): 100 Station: +2 Exam by:: Dr. Natale Milch  Labs: Lab Results  Component Value Date   WBC 17.1* 10/28/2010   HGB 12.6 10/28/2010   HCT 37.4 10/28/2010   MCV 95.7 10/28/2010   PLT 170 10/28/2010    Assessment / Plan: Augmentation of labor, progressing well Fetal Wellbeing:  Category II Pain Control:  Epidural I/D:  on amp and gent for presumed chorio Anticipated MOD:  NSVD  Elize Pinon N 10/29/2010, 9:03 PM

## 2010-10-29 NOTE — Progress Notes (Signed)
Tammy Snyder is a 20 y.o. G1P0 at [redacted]w[redacted]d admitted for active labor. Over the last hour, patient has been noted to have a change in fetal heart rate baseline as well as temperature to 100.4. MVUs have been adequate for just over one hour.  Objective: BP 136/77  Pulse 129  Temp(Src) 100.4 F (38 C) (Axillary)  Resp 18  Ht 5' 1.5" (1.562 m)  Wt 77.202 kg (170 lb 3.2 oz)  BMI 31.64 kg/m2  SpO2 99%  LMP 01/11/2010 I/O last 3 completed shifts: In: -  Out: 400 [Urine:400] Total I/O In: -  Out: 450 [Urine:450]  FHT:  FHR: 160-170 bpm, variability: moderate,  accelerations:  Present,  decelerations:  Absent UC:   regular, every 1-3 minutes SVE:   Dilation: 9 Effacement (%):  (thick lip on R side) Station: -1 Exam by:: Dr. Natale Milch  Labs: Lab Results  Component Value Date   WBC 17.1* 10/28/2010   HGB 12.6 10/28/2010   HCT 37.4 10/28/2010   MCV 95.7 10/28/2010   PLT 170 10/28/2010    Assessment / Plan: Augmentation of labor, progressing well, pitocin at 4 mU/min; MVUs currently at 180-210. Fetal Wellbeing:  Category II due to fetal tachycardia Pain Control:  Epidural I/D:  Will start ampicillin and gentamicin for presumed chorio; patient is allergic to tylenol and NSAIDs Anticipated MOD:  NSVD  Ivee Poellnitz N 10/29/2010, 3:48 PM

## 2010-10-29 NOTE — Anesthesia Procedure Notes (Signed)
Epidural Patient location during procedure: OB Start time: 10/29/2010 12:08 AM End time: 10/29/2010 12:14 AM Reason for block: procedure for pain  Staffing Anesthesiologist: Sandrea Hughs Performed by: anesthesiologist   Preanesthetic Checklist Completed: patient identified, site marked, surgical consent, pre-op evaluation, timeout performed, IV checked, risks and benefits discussed and monitors and equipment checked  Epidural Patient position: sitting Prep: site prepped and draped and DuraPrep Patient monitoring: continuous pulse ox and blood pressure Approach: midline Injection technique: LOR air  Needle:  Needle type: Tuohy  Needle gauge: 17 G Needle length: 9 cm Needle insertion depth: 5 cm cm Catheter type: closed end flexible Catheter size: 19 Gauge Catheter at skin depth: 10 cm Test dose: negative and 1.5% lidocaine  Assessment Sensory level: T8 Events: blood not aspirated, injection not painful, no injection resistance, negative IV test and no paresthesia

## 2010-10-29 NOTE — Progress Notes (Signed)
Subjective: Admitted for SOL.  Feeling some contractions.  Fairly comfortable with epidural.  Objective: BP 117/69  Pulse 118  Temp(Src) 99.6 F (37.6 C) (Axillary)  Resp 20  Ht 5' 1.5" (1.562 m)  Wt 77.202 kg (170 lb 3.2 oz)  BMI 31.64 kg/m2  SpO2 99%  LMP 01/11/2010 I/O last 3 completed shifts: In: -  Out: 400 [Urine:400] Total I/O In: -  Out: 450 [Urine:450]  Reviewed past 2 hours of FHT FHT:  FHR: 120s bpm, variability: moderate,  accelerations:  Present,  decelerations:  Absent UC:   regular, every 2-3 minutes SVE:   Dilation: 8 Effacement (%): 100 Station: 0 Exam by:: E. Cone RNC  Labs: Lab Results  Component Value Date   WBC 17.1* 10/28/2010   HGB 12.6 10/28/2010   HCT 37.4 10/28/2010   MCV 95.7 10/28/2010   PLT 170 10/28/2010    Assessment / Plan: Prolonged active phase  Labor: Titrating pitocin for adequate contractions Preeclampsia:  NA Fetal Wellbeing:  Category I Pain Control:  Epidural I/D:  n/a Anticipated MOD:  NSVD  Landy Mace JEHIEL 10/29/2010, 2:19 PM

## 2010-10-29 NOTE — Progress Notes (Signed)
Tammy Snyder is a 20 y.o. G2P0100 at [redacted]w[redacted]d admitted for active labor Patient has been pushing for approximately 3 hours, in many positions, and also trying the "tug of war" pulling on the sheet without any progress in descent of the baby in the birth canal. Baby is palpated as OP position; patient reports feeling very exhausted and not feeling like she has any more energy to give to pushing.  Objective: BP 135/87  Pulse 129  Temp(Src) 100.5 F (38.1 C) (Axillary)  Resp 18  Ht 5' 1.5" (1.562 m)  Wt 77.202 kg (170 lb 3.2 oz)  BMI 31.64 kg/m2  SpO2 99%  LMP 01/11/2010 I/O last 3 completed shifts: In: -  Out: 850 [Urine:850]    FHT:  FHR: 150s bpm, variability: moderate,  accelerations:  Present,  decelerations:  Present occasional variables seen UC:   regular, every 1-2 minutes SVE:   Dilation: 10 Effacement (%): 100 Station: +2 Exam by:: Dr. Natale Milch  Labs: Lab Results  Component Value Date   WBC 17.1* 10/28/2010   HGB 12.6 10/28/2010   HCT 37.4 10/28/2010   MCV 95.7 10/28/2010   PLT 170 10/28/2010    Assessment / Plan: Arrest of decent Fetal Wellbeing:  Category II Pain Control:  Epidural I/D:  continues on amp and gent Anticipated MOD:  Discussed with the patient that she has been pushing for 3 hours without change in station. Discussed c-section delivery, and the risks with this delivery to include but not be limited to pain, bleeding, longer postpartum hospital stay, injury to bowel, bladder or baby; bleeding and possible need for hysterectomy. All questions were answered, and consent was signed. Dr Despina Hidden agrees with plans.  [name] 10/29/2010, 10:22 PM

## 2010-10-29 NOTE — Progress Notes (Signed)
Tammy Snyder is a 20 y.o. G2P0100 at [redacted]w[redacted]d admitted for active labor Patient was noted to be complete about 1 hour ago and has been pushing for about 12 minutes.  Objective: BP 154/98  Pulse 137  Temp(Src) 100.6 F (38.1 C) (Axillary)  Resp 20  Ht 5' 1.5" (1.562 m)  Wt 77.202 kg (170 lb 3.2 oz)  BMI 31.64 kg/m2  SpO2 99%  LMP 01/11/2010 I/O last 3 completed shifts: In: -  Out: 850 [Urine:850]    FHT:  FHR: 150s bpm, variability: moderate,  accelerations:  Present,  decelerations:  Present variables noted UC:   regular, every 3 minutes SVE:   Dilation: 10 Effacement (%): 100 Station: +2 Exam by:: Dr. Natale Milch  Labs: Lab Results  Component Value Date   WBC 17.1* 10/28/2010   HGB 12.6 10/28/2010   HCT 37.4 10/28/2010   MCV 95.7 10/28/2010   PLT 170 10/28/2010    Assessment / Plan: Augmentation of labor, progressing well, pushing Fetal Wellbeing:  Category II Pain Control:  Epidural I/D:  on amp and gent for presumed chorio Anticipated MOD:  NSVD  Deneane Stifter N 10/29/2010, 7:47 PM

## 2010-10-29 NOTE — Progress Notes (Signed)
Tammy Snyder is a 20 y.o. G1P0 at [redacted]w[redacted]d  admitted for active labor  Subjective:   Objective: BP 120/74  Pulse 128  Temp(Src) 100 F (37.8 C) (Axillary)  Resp 20  Ht 5' 1.5" (1.562 m)  Wt 77.202 kg (170 lb 3.2 oz)  BMI 31.64 kg/m2  SpO2 99%  LMP 01/11/2010 I/O last 3 completed shifts: In: -  Out: 400 [Urine:400] Total I/O In: -  Out: 450 [Urine:450]  FHT:  FHR: 150-180 bpm, variability: moderate,  accelerations:  Present,  decelerations:  Absent; some loss of variability noted but still moderate UC:   regular, every 1-3 minutes SVE:   Dilation: Lip/rim Effacement (%):  (thick lip on R side) Station: +1 Exam by:: Dr. Natale Milch  Labs: Lab Results  Component Value Date   WBC 17.1* 10/28/2010   HGB 12.6 10/28/2010   HCT 37.4 10/28/2010   MCV 95.7 10/28/2010   PLT 170 10/28/2010    Assessment / Plan: Augmentation of labor, progressing well Fetal Wellbeing:  Category II Pain Control:  Epidural I/D:  on amp and gent for presumed chorio Anticipated MOD:  NSVD  Xavier Munger N 10/29/2010, 5:34 PM

## 2010-10-29 NOTE — Progress Notes (Signed)
Stephan Draughn is a 20 y.o. G1P0 at [redacted]w[redacted]d admitted for active labor. Patient has epidural, and had her membranes ruptured earlier this AM.  Subjective:   Objective: BP 140/75  Pulse 110  Temp(Src) 99.3 F (37.4 C) (Axillary)  Resp 20  Ht 5' 1.5" (1.562 m)  Wt 170 lb 3.2 oz (77.202 kg)  BMI 31.64 kg/m2  SpO2 99%  LMP 01/11/2010 I/O last 3 completed shifts: In: -  Out: 400 [Urine:400] Total I/O In: -  Out: 450 [Urine:450]  FHT:  FHR: 120 bpm, variability: moderate,  accelerations:  Present,  decelerations:  Absent UC:   regular, every 3 minutes SVE:   Dilation: 7 Effacement (%): 90 Station: 0 Exam by:: Dr. Natale Milch  Labs: Lab Results  Component Value Date   WBC 17.1* 10/28/2010   HGB 12.6 10/28/2010   HCT 37.4 10/28/2010   MCV 95.7 10/28/2010   PLT 170 10/28/2010    Assessment / Plan: Spontaneous labor, progressing normally. IUPC placed; if MVUs <180, will start pitocin.  Labor: Progressing normally Fetal Wellbeing:  Category I Pain Control:  Epidural I/D:  n/a Anticipated MOD:  NSVD  Onika Gudiel N 10/29/2010, 10:44 AM

## 2010-10-29 NOTE — Transfer of Care (Signed)
Immediate Anesthesia Transfer of Care Note  Patient: Tammy Snyder  Procedure(s) Performed:  CESAREAN SECTION  Patient Location: PACU  Anesthesia Type: Epidural  Level of Consciousness: awake, alert  and oriented  Airway & Oxygen Therapy: Patient Spontanous Breathing  Post-op Assessment: Report given to PACU RN and Post -op Vital signs reviewed and stable  Post vital signs: Reviewed and stable  Complications: No apparent anesthesia complications

## 2010-10-29 NOTE — Op Note (Signed)
Preoperative diagnosis:  1.  Intrauterine pregnancy at 40 2/[redacted] weeks gestation                                         2.  Secondary arrest of descent in the second stage of labor                                         3.  Chorioamnionitis   Postoperative diagnosis:  Same as above   Procedure:  Primary cesarean section  Surgeon:  Lazaro Arms MD  Assistant:  Holbrooke DO  Anesthesia: Epidural  Findings:  Patient had a very slow 1st stage of labor, developed chorioamnionitis and was placed on ampicillin and gentamicin.  She pushed for greater than 2 hours made no progress and we decided to proceed with primary caesarean section.  The baby was asynclitic and  ROP.  Over a low transverse incision was delivered a viable female with Apgars of 8 and 9 weighing pending. Uterus, tubes and ovaries were all normal.  There were no other significant findings  Description of operation:  Patient was taken to the operating room and placed in the suine position where shedosing of her epidural anesthetic. She was then placed in the supine position with tilt to the left side. When adequate anesthetic level was obtained she was prepped and draped in usual sterile fashion.  A Pfannenstiel skin incision was made and carried down sharply to the rectus fascia which was scored in the midline extended laterally. The fascia was taken off the muscles both superiorly and without difficulty. The muscles were divided.  The peritoneal cavity was entered.  Bladder blade was placed, no bladder flap was created.  A low transverse hysterotomy incision was made and delivered a viable female  infant at 2254 with Apgars of 8 and 9 weighing pending in the nursery.  Cord pH was obtained and was unable to be run. The uterus was exteriorized. It was closed in 2 layers, the first being a running interlocking layer and the second being an imbricating layer using 0 monocryl on a CTX needle. There was good resulting hemostasis. The uterus tubes  and ovaries were all normal. Peritoneal cavity was irrigated vigorously. The muscles and peritoneum were reapproximated loosely. The fascia was closed using 0 Vicryl in running fashion. Subcutaneous tissue was made hemostatic and irrigated. The skin was closed using skin staples. Blood loss for the procedure was 800 cc. The patient received a gram of Ancef prophylactically. The patient was taken to the recovery room in good stable condition with all counts being correct x3.  EBL 800 cc  Breeley Bischof H 10/29/2010 11:27 PM

## 2010-10-29 NOTE — H&P (Signed)
Tammy Snyder is a 20 y.o. female at [redacted]w[redacted]d by second trimester [place] presenting for term labor eval. She reports strung UC's, pos FM, pos bloody show and denies LOF. Maternal Medical History:  Reason for admission: Reason for admission: contractions.  Contractions: Frequency: regular.   Duration is approximately 60 seconds.   Perceived severity is strong.    Fetal activity: Perceived fetal activity is normal.   Last perceived fetal movement was within the past hour.      OB History    Grav Para Term Preterm Abortions TAB SAB Ect Mult Living   1              Past Medical History  Diagnosis Date  . No pertinent past medical history   . Bipolar 1 disorder    Past Surgical History  Procedure Date  . Cochlear implant    Family History: family history includes Hypertension in her father and Stroke in her father. Social History:  reports that she quit smoking about 9 months ago. She has never used smokeless tobacco. She reports that she does not drink alcohol or use illicit drugs.  Review of Systems  All other systems reviewed and are negative.   Dilation: 4 Effacement (%): 80 Station: -2 Exam by:: Lcarpenter,RN Blood pressure 129/67, pulse 84, temperature 97.8 F (36.6 C), temperature source Oral, resp. rate 18, height 5' 1.5" (1.562 m), weight 77.202 kg (170 lb 3.2 oz), last menstrual period 01/11/2010, SpO2 99.00%. Maternal Exam:  Uterine Assessment: Contraction strength is moderate.  Contraction duration is 60 seconds. Contraction frequency is regular.   Abdomen: Patient reports no abdominal tenderness. Fundal height is S=D.   Fetal presentation: vertex  Introitus: Normal vulva. Normal vagina.  Pelvis: adequate for delivery.   Cervix: Cervix evaluated by digital exam.     Fetal Exam Fetal Monitor Review: Mode: ultrasound.   Baseline rate: 140.  Variability: moderate (6-25 bpm).   Pattern: accelerations present and no decelerations.    Fetal State Assessment: Category I  - tracings are normal.     Physical Exam  Constitutional: She is oriented to person, place, and time. She appears well-developed and well-nourished. She appears distressed.  Eyes: Pupils are equal, round, and reactive to light.  Neck: Normal range of motion.  Cardiovascular: Normal rate.   Respiratory: Effort normal.  GI: Soft.  Genitourinary: Vagina normal and uterus normal. No bleeding around the vagina.  Musculoskeletal: Normal range of motion.  Neurological: She is alert and oriented to person, place, and time. She has normal reflexes.  Skin: Skin is warm and dry.  Psychiatric: She has a normal mood and affect.    Prenatal labs: ABO, Rh: O/POS/-- (03/05 2139) Antibody: NEG (03/05 2139) Rubella: 60.5 (03/05 2139) RPR: NON REACTIVE (10/11 1935)  HBsAg: NEGATIVE (03/05 2139)  HIV: NON REACTIVE (07/25 1651)  GBS: NEGATIVE (09/12 1202)  1 hour GTT 121  Assessment/Plan: Assessment: 1. 40.1 week IUP 2. Early labor.  3. FHR category I   Plan: 1. Admit to BS  2. Analgesia PRN 3. May ambulate 4. Routine labor orders 5. Intermittent monitoring  Tekesha Almgren 10/29/2010, 1:17 AM

## 2010-10-29 NOTE — Progress Notes (Signed)
Tammy Snyder is a 20 y.o. G1P0 at [redacted]w[redacted]d admitted for active labor  Subjective: Tolerating UC's. Does not plan epidural   Objective: BP 129/67  Pulse 84  Temp(Src) 97.8 F (36.6 C) (Oral)  Resp 18  Ht 5' 1.5" (1.562 m)  Wt 77.202 kg (170 lb 3.2 oz)  BMI 31.64 kg/m2  SpO2 99%  LMP 01/11/2010      FHT:  FHR: 140 bpm, variability: moderate,  accelerations:  Present,  decelerations:  Absent UC:   regular, every 2-3 minutes SVE:   Dilation: 4 Effacement (%): 80 Station: -2 Exam by::Tammy Bethea,RN  Labs: Lab Results  Component Value Date   WBC 17.1* 10/28/2010   HGB 12.6 10/28/2010   HCT 37.4 10/28/2010   MCV 95.7 10/28/2010   PLT 170 10/28/2010    Assessment / Plan: Spontaneous labor, progressing normally  Labor: no change x 2 hours Preeclampsia:  NA Fetal Wellbeing:  Category I Pain Control:  Labor support without medications I/D:  n/a Anticipated MOD:  NSVD  Tammy Snyder 10/29/2010, 1:02 AM

## 2010-10-29 NOTE — Progress Notes (Signed)
Tammy Snyder is a 20 y.o. G1P0 at [redacted]w[redacted]d. Subjective: Comfortable w/ epidural   Objective: BP 122/70  Pulse 94  Temp(Src) 98.3 F (36.8 C) (Oral)  Resp 18  Ht 5' 1.5" (1.562 m)  Wt 77.202 kg (170 lb 3.2 oz)  BMI 31.64 kg/m2  SpO2 99%  LMP 01/11/2010      FHT:  FHR: 130 bpm, variability: moderate,  accelerations:  Present,  decelerations:  Present few mild variables UC:   regular, every 3 minutes SVE:   Dilation: 6 Effacement (%): 90 Station: -1 Exam by:: K.Booker,CNM student  Labs: Lab Results  Component Value Date   WBC 17.1* 10/28/2010   HGB 12.6 10/28/2010   HCT 37.4 10/28/2010   MCV 95.7 10/28/2010   PLT 170 10/28/2010    Assessment / Plan: Spontaneous labor, progressing slowly  Labor: Active Preeclampsia:  NA Fetal Wellbeing:  Category I Pain Control:  Epidural I/D:  n/a Anticipated MOD:  NSVD Consider IUPC if minimal at next exam. Dr. Penne Lash given update  Tammy Snyder 10/29/2010, 6:02 AM

## 2010-10-29 NOTE — Anesthesia Postprocedure Evaluation (Signed)
Anesthesia Post Note  Patient: Tammy Snyder  Procedure(s) Performed:  CESAREAN SECTION  Anesthesia type: Epidural  Patient location: PACU  Post pain: Pain level controlled  Post assessment: Post-op Vital signs reviewed  Last Vitals:  Filed Vitals:   10/29/10 2340  BP: 135/81  Pulse: 113  Temp: 98.2 F (36.8 C)  Resp: 18    Post vital signs: Reviewed  Level of consciousness: awake  Complications: No apparent anesthesia complications

## 2010-10-29 NOTE — Consult Note (Signed)
Called to attend C/S for failure of descent at  term gestation. Mother is a 20 years old [id number].  At birth infant in vertex and head manually disengaged and delivered from vertex with loose nuchal cord x1.  Spontaneous cry once placed under radiant warmer and given bulb suction and tactile stim with drying.   Shown to mother and then carried to Transitional Nursery. Care to assigned pediatrician.    Dagoberto Ligas MD [facility]

## 2010-10-29 NOTE — Progress Notes (Signed)
Tammy Snyder is a 20 y.o. G1P0 at [redacted]w[redacted]d admitted for active labor  Subjective: Comfortable s/p epidural, no complaints  Objective: BP 122/80   Pulse 94   Temp(Src) 97.9 F (36.6 C) (Oral)   Resp 18   Ht 5' 1.5" (1.562 m)   Wt 77.202 kg (170 lb 3.2 oz)   BMI 31.64 kg/m2   SpO2 99%   LMP 01/11/2010      FHT:  FHR: 120 bpm, variability: moderate,  accelerations:  Present,  decelerations:  Absent UC:   regular, every 2-4 minutes, not tracing well- pt on side SVE:   Dilation: 5 Effacement (%): 90 Station: -1 Exam by:: K.Booker,CNM/ student AROM- small amount clear fluid  Labs: Lab Results  Component Value Date   WBC 17.1* 10/28/2010   HGB 12.6 10/28/2010   HCT 37.4 10/28/2010   MCV 95.7 10/28/2010   PLT 170 10/28/2010    Assessment / Plan: Protracted active phase- now ruptured Reassuring maternal/fetal status   Labor: Protracted active phase Preeclampsia:  no signs or symptoms of toxicity Fetal Wellbeing:  Category I Pain Control:  Epidural I/D:  n/a Anticipated MOD:  NSVD  Joellyn Haff, SNM 10/29/2010, 2:36 AM  Dorathy Kinsman, CNM present in room

## 2010-10-29 NOTE — Progress Notes (Signed)
  Patient has been pushing for 2 hours and has not made any progress.  FHR tracing still reassuring.  On Gent and Ampicillin for chorioamnionitis.  Will proceed with primary caesarean section.  Tammy Snyder H 10:26 PM 10/29/2010

## 2010-10-30 LAB — CBC
Hemoglobin: 11.1 g/dL — ABNORMAL LOW (ref 12.0–15.0)
MCH: 33.1 pg (ref 26.0–34.0)
MCHC: 34.9 g/dL (ref 30.0–36.0)
Platelets: 130 10*3/uL — ABNORMAL LOW (ref 150–400)
RDW: 13.3 % (ref 11.5–15.5)

## 2010-10-30 MED ORDER — OXYCODONE HCL 5 MG PO TABS
5.0000 mg | ORAL_TABLET | ORAL | Status: DC | PRN
  Administered 2010-10-30 – 2010-10-31 (×5): 10 mg via ORAL
  Filled 2010-10-30 (×5): qty 2

## 2010-10-30 MED ORDER — DIBUCAINE 1 % RE OINT: 1.0000 "application " | TOPICAL_OINTMENT | RECTAL | Status: DC | PRN

## 2010-10-30 MED ORDER — ONDANSETRON HCL 4 MG/2ML IJ SOLN: 4.0000 mg | Freq: Three times a day (TID) | INTRAMUSCULAR | Status: DC | PRN

## 2010-10-30 MED ORDER — WITCH HAZEL-GLYCERIN EX PADS: 1.0000 "application " | MEDICATED_PAD | CUTANEOUS | Status: DC | PRN

## 2010-10-30 MED ORDER — SCOPOLAMINE 1 MG/3DAYS TD PT72
MEDICATED_PATCH | TRANSDERMAL | Status: AC
  Filled 2010-10-30: qty 1

## 2010-10-30 MED ORDER — ZOLPIDEM TARTRATE 5 MG PO TABS: 5.0000 mg | ORAL_TABLET | Freq: Every evening | ORAL | Status: DC | PRN

## 2010-10-30 MED ORDER — PRENATAL PLUS 27-1 MG PO TABS
1.0000 | ORAL_TABLET | Freq: Every day | ORAL | Status: DC
  Administered 2010-11-01: 1 via ORAL
  Filled 2010-10-30: qty 1

## 2010-10-30 MED ORDER — NALBUPHINE HCL 10 MG/ML IJ SOLN
5.0000 mg | INTRAMUSCULAR | Status: DC | PRN
  Filled 2010-10-30: qty 1

## 2010-10-30 MED ORDER — DIPHENHYDRAMINE HCL 25 MG PO CAPS
25.0000 mg | ORAL_CAPSULE | Freq: Four times a day (QID) | ORAL | Status: DC | PRN
  Filled 2010-10-30: qty 1

## 2010-10-30 MED ORDER — DIPHENHYDRAMINE HCL 25 MG PO CAPS: 25.0000 mg | ORAL_CAPSULE | ORAL | Status: DC | PRN

## 2010-10-30 MED ORDER — METOCLOPRAMIDE HCL 5 MG/ML IJ SOLN: 10.0000 mg | Freq: Three times a day (TID) | INTRAMUSCULAR | Status: DC | PRN

## 2010-10-30 MED ORDER — METHYLERGONOVINE MALEATE 0.2 MG/ML IJ SOLN: 0.2000 mg | INTRAMUSCULAR | Status: DC | PRN

## 2010-10-30 MED ORDER — TETANUS-DIPHTH-ACELL PERTUSSIS 5-2.5-18.5 LF-MCG/0.5 IM SUSP: 0.5000 mL | Freq: Once | INTRAMUSCULAR | Status: DC

## 2010-10-30 MED ORDER — LACTATED RINGERS IV SOLN
INTRAVENOUS | Status: DC
  Administered 2010-10-30 – 2010-10-31 (×2): via INTRAVENOUS

## 2010-10-30 MED ORDER — DIPHENHYDRAMINE HCL 50 MG/ML IJ SOLN
12.5000 mg | INTRAMUSCULAR | Status: DC | PRN
Start: 2010-10-30 — End: 2010-11-01

## 2010-10-30 MED ORDER — LANOLIN HYDROUS EX OINT: 1.0000 "application " | TOPICAL_OINTMENT | CUTANEOUS | Status: DC | PRN

## 2010-10-30 MED ORDER — MENTHOL 3 MG MT LOZG: 1.0000 | LOZENGE | OROMUCOSAL | Status: DC | PRN

## 2010-10-30 MED ORDER — HYDROMORPHONE HCL 1 MG/ML IJ SOLN
INTRAMUSCULAR | Status: AC
  Filled 2010-10-30: qty 1

## 2010-10-30 MED ORDER — ONDANSETRON HCL 4 MG/2ML IJ SOLN: 4.0000 mg | INTRAMUSCULAR | Status: DC | PRN

## 2010-10-30 MED ORDER — HYDROMORPHONE HCL 1 MG/ML IJ SOLN
1.0000 mg | INTRAMUSCULAR | Status: DC | PRN
  Administered 2010-10-30 – 2010-10-31 (×6): 1 mg via INTRAVENOUS
  Filled 2010-10-30 (×6): qty 1

## 2010-10-30 MED ORDER — OXYTOCIN 20 UNITS IN LACTATED RINGERS INFUSION - SIMPLE: 125.0000 mL/h | INTRAVENOUS | Status: AC

## 2010-10-30 MED ORDER — MEPERIDINE HCL 25 MG/ML IJ SOLN: 6.2500 mg | INTRAMUSCULAR | Status: DC | PRN

## 2010-10-30 MED ORDER — SENNOSIDES-DOCUSATE SODIUM 8.6-50 MG PO TABS
2.0000 | ORAL_TABLET | Freq: Every day | ORAL | Status: DC
  Administered 2010-10-30 – 2010-10-31 (×2): 2 via ORAL

## 2010-10-30 MED ORDER — ONDANSETRON HCL 4 MG PO TABS: 4.0000 mg | ORAL_TABLET | ORAL | Status: DC | PRN

## 2010-10-30 MED ORDER — SODIUM CHLORIDE 0.9 % IJ SOLN: 3.0000 mL | INTRAMUSCULAR | Status: DC | PRN

## 2010-10-30 MED ORDER — SODIUM CHLORIDE 0.9 % IV SOLN
3.0000 g | Freq: Four times a day (QID) | INTRAVENOUS | Status: AC
  Administered 2010-10-30 – 2010-10-31 (×4): 3 g via INTRAVENOUS
  Filled 2010-10-30 (×4): qty 3

## 2010-10-30 MED ORDER — SODIUM CHLORIDE 0.9 % IV SOLN
1.0000 ug/kg/h | INTRAVENOUS | Status: DC | PRN
  Filled 2010-10-30: qty 2.5

## 2010-10-30 MED ORDER — NALOXONE HCL 0.4 MG/ML IJ SOLN: 0.4000 mg | INTRAMUSCULAR | Status: DC | PRN

## 2010-10-30 MED ORDER — DIPHENHYDRAMINE HCL 50 MG/ML IJ SOLN: 25.0000 mg | INTRAMUSCULAR | Status: DC | PRN

## 2010-10-30 MED ORDER — SIMETHICONE 80 MG PO CHEW: 80.0000 mg | CHEWABLE_TABLET | ORAL | Status: DC | PRN

## 2010-10-30 MED ORDER — METHYLERGONOVINE MALEATE 0.2 MG PO TABS: 0.2000 mg | ORAL_TABLET | ORAL | Status: DC | PRN

## 2010-10-30 MED ORDER — SIMETHICONE 80 MG PO CHEW
80.0000 mg | CHEWABLE_TABLET | Freq: Three times a day (TID) | ORAL | Status: DC
  Administered 2010-10-30 – 2010-11-01 (×6): 80 mg via ORAL

## 2010-10-30 NOTE — Progress Notes (Signed)
Subjective: Postpartum Day 1: Cesarean Delivery Patient reports incisional pain and tolerating PO.  Reports being unable to stand secondary to the pain. She also complains of her arms hurting from the IV lines and blood draws. She reports that the baby does not want to latch well for breastfeeding. Current pain meds are not controlling her pain.  Objective: Vital signs in last 24 hours: Temp:  [98 F (36.7 C)-100.7 F (38.2 C)] 98.4 F (36.9 C) (10/13 0630) Pulse Rate:  [96-137] 128  (10/13 0630) Resp:  [16-28] 20  (10/13 0630) BP: (102-154)/(49-98) 116/69 mmHg (10/13 0630) SpO2:  [96 %-100 %] 96 % (10/13 0630)  Physical Exam:  General: alert, cooperative, fatigued and mild distress Heart: RRR, no murmur Lungs: CTA b/l Lochia: appropriate Uterine Fundus: firm Incision: healing well, tender DVT Evaluation: No evidence of DVT seen on physical exam. Flat nipples noted   Basename 10/30/10 0522 10/28/10 1935  HGB 11.1* 12.6  HCT 31.8* 37.4    Assessment/Plan: Status post Cesarean section. Doing well postoperatively.  Continue current care. Will have lactation see patient. Will try dialudid for pain control.  Malky Rudzinski N 10/30/2010, 7:42 AM

## 2010-10-30 NOTE — Progress Notes (Signed)
Encounter addended by: Len Blalock on: 10/30/2010  5:30 PM<BR>     Documentation filed: Notes Section

## 2010-10-30 NOTE — H&P (Signed)
Agree with above note.  Tammy Snyder H. 10/30/2010 6:52 AM

## 2010-10-30 NOTE — Anesthesia Postprocedure Evaluation (Signed)
  Anesthesia Post-op Note  Patient: Tammy Snyder  Procedure(s) Performed:  CESAREAN SECTION  Patient Location: PACU and Mother/Baby  Anesthesia Type: Epidural  Level of Consciousness: awake, alert  and oriented  Airway and Oxygen Therapy: Patient Spontanous Breathing  Post-op Assessment: Patient's Cardiovascular Status Stable and Respiratory Function Stable  Post-op Vital Signs: stable  Complications: pt states she felt pain at the end of the procedure and has had poor pain control post-op

## 2010-10-31 ENCOUNTER — Encounter (HOSPITAL_COMMUNITY): Payer: Self-pay | Admitting: Obstetrics & Gynecology

## 2010-10-31 LAB — CBC
HCT: 27.5 % — ABNORMAL LOW (ref 36.0–46.0)
Hemoglobin: 9.6 g/dL — ABNORMAL LOW (ref 12.0–15.0)
MCV: 94.8 fL (ref 78.0–100.0)
RBC: 2.9 MIL/uL — ABNORMAL LOW (ref 3.87–5.11)
WBC: 18.5 10*3/uL — ABNORMAL HIGH (ref 4.0–10.5)

## 2010-10-31 LAB — DIFFERENTIAL
Eosinophils Relative: 1 % (ref 0–5)
Lymphocytes Relative: 9 % — ABNORMAL LOW (ref 12–46)
Lymphs Abs: 1.7 10*3/uL (ref 0.7–4.0)
Monocytes Absolute: 1.5 10*3/uL — ABNORMAL HIGH (ref 0.1–1.0)
Neutro Abs: 15.1 10*3/uL — ABNORMAL HIGH (ref 1.7–7.7)

## 2010-10-31 MED ORDER — METHYLERGONOVINE MALEATE 0.2 MG/ML IJ SOLN
INTRAMUSCULAR | Status: AC
  Filled 2010-10-31: qty 1

## 2010-10-31 MED ORDER — SODIUM CHLORIDE 0.9 % IV SOLN
3.0000 g | Freq: Four times a day (QID) | INTRAVENOUS | Status: AC
  Administered 2010-10-31 – 2010-11-01 (×4): 3 g via INTRAVENOUS
  Filled 2010-10-31 (×4): qty 3

## 2010-10-31 MED ORDER — HYDROMORPHONE HCL 2 MG PO TABS
4.0000 mg | ORAL_TABLET | ORAL | Status: DC | PRN
  Administered 2010-10-31 – 2010-11-01 (×6): 4 mg via ORAL
  Filled 2010-10-31 (×6): qty 2

## 2010-10-31 NOTE — Progress Notes (Signed)
Subjective: Postpartum Day 2: Cesarean Delivery Patient reports incisional pain and tolerating PO.  Having difficulty voiding overnight - had two attempts, swollen perineum overnight.  Abdomen exquisitely tender.  Able to ambulate without difficulty.  Denies nausea.  Objective: Vital signs in last 24 hours: Temp:  [98.2 F (36.8 C)-100.8 F (38.2 C)] 99.2 F (37.3 C) (10/14 0538) Pulse Rate:  [108-134] 121  (10/14 0538) Resp:  [18-22] 18  (10/14 0538) BP: (99-134)/(58-87) 120/74 mmHg (10/14 0538) SpO2:  [95 %-96 %] 95 % (10/14 0100)  Physical Exam:  General: alert, cooperative and mild distress Lochia: appropriate Uterine Fundus: tenderness Incision: no significant drainage DVT Evaluation: No cords or calf tenderness. No significant calf/ankle edema.   Basename 10/30/10 0522 10/28/10 1935  HGB 11.1* 12.6  HCT 31.8* 37.4    Assessment/Plan: Status post Cesarean section. increased pain.  Will check WBC.  Did spike temperture at midnight - had last dose of ampicillin at that time.    Continue current care.  Khameron Gruenwald JEHIEL 10/31/2010, 6:20 AM

## 2010-10-31 NOTE — Progress Notes (Signed)
Patient chief C/O sever pain to abdomen and attempted to urinate x2 With out measurable output. Patient expressed heighten anxiety which cause her heart rate to be increase to 134

## 2010-10-31 NOTE — Progress Notes (Signed)
PSYCHOSOCIAL ASSESSMENT ~ MATERNAL/CHILD Name: Tammy Snyder        Age:  20 years    Referral Date:  10/29/2010     Reason/Source:  Hx of bipolar/borderline personality disorder/Care Provider referral FAMILY/HOME ENVIRONMENT A. Child's Legal Guardian X Parent(s)   Name:  Tammy Snyder DOB:  09-Nov-1990   Age: 43 Address:  [address], [place], [place], [address]  [phone]  Name:  Tammy Snyder  Address :  Same as MOB B. Other Household Members/Support Persons Paternal Grandparents         C.   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               X  Food Stamps     X  WIC                      X  Other:  [facility] (MOB)  STRENGTHS X Supportive family/friends  X Adequate Resources X Compliance with medical plan   X Home prepared for Child (including basic supplies)                           X Other:  Dr. Hazel Sams Primary Care Provider  RISK FACTORS AND CURRENT PROBLEMS          X Maternal Mental Illness-Hx of Bipolar/Borderline Personality Disorder  X Family/Relationship Issues- MOB has strained relationship with her father                X Maternal Trauma History              SOCIAL WORK ASSESSMENT Met with baby, MOB, and FOB at bedside to assess strengths and needs related to referral of maternal behavioral health concerns.  MOB acknowledged past medication management for behavioral health needs with Depakote, Celexa, and Melatonin for sleep.  MOB reports that about one month before she got pregnant, she weaned herself off her meds because she feared the meds might harm her baby.  MOB feels she has done well without her medications.  FOB reports MOB coping is adequate, but feels she did better while on medications.  MOB very much wants to  breastfeed and prefers not to take medications.  Validated and normalized her concerns.  Offered alternatives to thinking about wellness and healthcare management as comprehensive, holistic, and supportive of family goals and functioning.  MOB reports having a trauma history, though she preferred not to discuss given our brief interaction.  Validated her concerns.  She reports having had increased stressors when living with her father.  She reports they have a strained relationship and she does not approve of some of his life choices.  She reports things have been better since she moved out of his house and began living with FOB.  MOB reports having received behavioral health services through [facility], but they lost funding and services ceased.  She did not pursue alternative service provider.  She feels she is managing her behavioral health needs well.  She feels she has utilized counseling services when needed, and she is open to trying that again  if needed.  Discussed behavioral health providers in her city who might be able to provide array of services if needed.  MOB reports that she has a good relationship with her Primary Care Doctor-Dr. Vevelyn Royals, and is open to receiving services from her if needed.  MOB has friends who have children and a cousin who is about 2 months older than her newborn.  She is looking forward to engaging with her friends, having their children play together, and learning about the different ways she can parent her child.  MOB reports that paternal grandparents live right across the street.  Paternal grandmother was with MOB during labor and delivery.  They are a great source of support.  MOB reports having all the things they need for baby-clothes, bassinet, car seat, etc.  Parents plan to have baby sleep in his bassinet in their room until he is a little older.  MOB plan to be a homemaker as she is on disability for her hearing loss.  Paternal grandparents own a home  across the street that MOB and FOB rent from them.  There expenses are minimal and they feel they can manage.    MOB does not anticipate problems with mood dysregulation and postpartum.  Discussed stressors and sleep challenges newborns place on parents and impact on coping and functioning.  FOB feels he and MOB have good support, and he is aware of behavioral health services available should MOB need them.  FOB was able to verbalize the importance of emotional wellness to MOB and newborn's health and well-being.  He is aware of counseling and/or behavioral health agencies that may be able to help.  He acknowledges that MOB may not want to utilize medications as part of her comprehensive wellness plan, though he is open to continuing the conversation and being aware of changes that would indicate a higher level of care.  MOB breast fed baby well.  She was somewhat nervous and very much wanting to provide nourishment for her baby who was hungry and crying.  With some calming words and encouragment, MOB was able to nurse her baby comfortably and which improved her mood.  Parents are well bonded to baby.  They have good supports.   SOCIAL WORK PLAN X No Further Intervention Required/No Barriers to Discharge  X Patient/Family Education:  Postpartum mood issues-Brochure of Feelings After Birth  X  Information/Referral to [employer]:  [facility] referral -[place]    Clinical Social Worker Signature:  Staci Acosta, LCSW      Date/Time:  10/31/2010, 4:39 pm

## 2010-11-01 MED ORDER — DIPHENHYDRAMINE HCL 25 MG PO CAPS
50.0000 mg | ORAL_CAPSULE | Freq: Four times a day (QID) | ORAL | Status: DC | PRN
  Administered 2010-11-01: 50 mg via ORAL
  Filled 2010-11-01: qty 1

## 2010-11-01 MED ORDER — HYDROMORPHONE HCL 4 MG PO TABS: 4.0000 mg | ORAL_TABLET | ORAL | Status: AC | PRN

## 2010-11-01 MED ORDER — DIPHENHYDRAMINE HCL 50 MG PO CAPS: 50.0000 mg | ORAL_CAPSULE | Freq: Four times a day (QID) | ORAL | Status: AC

## 2010-11-01 NOTE — Progress Notes (Signed)
Evaluated patient after she has been unable to void s/p straight cath at 1450 today; 550 mL of urine passed. She is willing to go home with a catheter in place. She received a latex catheter at some time following the c-section on Friday evening (10/12), and continues to have urethral swelling. She has had a foley and straight cath to void since removal. She has only been able to pass 150 mL of urine on her own.  Exam: Circumferential swelling of urethral meatus. Urethral opening noted.  Plan: Continue with plans for discharge. Will place a foley catheter (latex free) and discharge patient home with teaching and instructions on how to manage. Plan for f/u at [facility] on Wednesday (10/17) for evaluation and removal as warranted. Patient voices understanding and agrees with plans.

## 2010-11-01 NOTE — Discharge Summary (Signed)
Obstetric Discharge Summary Reason for Admission: onset of labor Prenatal Procedures: NST and ultrasound Intrapartum Procedures: cesarean: low cervical, transverse, IV antibiotics for presumed chorioamnionitis Postpartum Procedures: Foley catheter placement x 2, in and out catheter, bladder scan Complications-Operative and Postpartum: Urethral swelling due to allergic reaction to latex from foley catheter placement. This led to [facility] retention. She was discharged home with a LATEX FREE foley cathter for bladder rest. Hemoglobin  Date Value Range Status  10/31/2010 9.6* 12.0-15.0 (g/dL) Final     HCT  Date Value Range Status  10/31/2010 27.5* 36.0-46.0 (%) Final    Discharge Diagnoses: Term Pregnancy-delivered, Amnionitis and Urethral swelling from allergic reaction to latex  Discharge Information: Date: 11/01/2010 Activity: pelvic rest Diet: routine Medications: PNV, Colace, Iron and Dilaudid Condition: stable Instructions: refer to practice specific booklet Discharge to: home Follow-up Information    Please follow up. (Call [facility] in the Morning (11/02/2010) to make an appointment to be seen on Wednesday 11/03/2010)          Newborn Data: Live born female  Birth Weight: 9 lb 0.8 oz (4105 g) APGAR: 8, 9  Home with mother.  [name] 11/01/2010, 6:21 PM

## 2010-11-01 NOTE — Progress Notes (Signed)
Foley removed at 0930.  Patient only able to void 150 ml at 1330.  Patient bladder scanned for 321 ml.  MD notified.  Orders received to give Benadryl and In and Out catheterize patient.

## 2010-11-01 NOTE — Progress Notes (Signed)
UR chart review completed.  

## 2010-11-01 NOTE — Progress Notes (Signed)
Patient up several times during the evening and voided 50-100 ml with each void. Fundus at earlier check 1 above umbilicus with scant lochia. Patient tearful with each void stating " I am trying to urinate but I can't seem to". Instructed patient several times to call so bladder scan could be done. Patient remained in chair throughout evening feeding infant and reluctant to call when finished stating "I am afraid it will hurt". Patient medicated at 0125 with 4 mg of Dilaudid and was instructed to get into bed when comfortable for scan to be done. Scan done at 0205 for amount of 889 ml. Zerita Boers called and orders received to insert foley catheter. Foley catheter inserted at 0230 with immediate return of 1200 of dark amber urine. Jackalyn Lombard

## 2010-11-01 NOTE — Progress Notes (Signed)
Subjective: Postpartum Day3: Cesarean Delivery Patient reports incisional pain and tolerating PO.    Objective: Vital signs in last 24 hours: Temp:  [97.8 F (36.6 C)-98.6 F (37 C)] 98.5 F (36.9 C) (10/15 0610) Pulse Rate:  [94-112] 94  (10/15 0610) Resp:  [18-20] 18  (10/15 0610) BP: (107-118)/(71-73) 110/71 mmHg (10/15 0610) SpO2:  [92 %] 92 % (10/14 0830)  Physical Exam:  General: alert, cooperative, appears stated age and no distress Lochia: appropriate Uterine Fundus: firm Incision: healing well, no significant drainage, no dehiscence DVT Evaluation: No evidence of DVT seen on physical exam. Negative Homan's sign. No cords or calf tenderness. No significant calf/ankle edema.   Basename 10/31/10 0629 10/30/10 0522  HGB 9.6* 11.1*  HCT 27.5* 31.8*    Assessment/Plan: Status post Cesarean section. Postoperative course complicated by urinary retention. foly placed 1200cc obtained.  Continue current care.  Zerita Boers 11/01/2010, 6:50 AM

## 2010-11-02 NOTE — Progress Notes (Signed)
I was present for the exam and agree with above. Addition of Leukocytosis w/out fever or other evidence of infection.  Dorathy Kinsman 11/02/2010 4:43 AM

## 2010-11-03 ENCOUNTER — Other Ambulatory Visit (INDEPENDENT_AMBULATORY_CARE_PROVIDER_SITE_OTHER): Payer: Medicaid Other | Admitting: *Deleted

## 2010-11-03 ENCOUNTER — Inpatient Hospital Stay (HOSPITAL_COMMUNITY): Admission: RE | Admit: 2010-11-03 | Payer: Medicaid Other | Source: Ambulatory Visit

## 2010-11-03 DIAGNOSIS — Z09 Encounter for follow-up examination after completed treatment for conditions other than malignant neoplasm: Secondary | ICD-10-CM

## 2010-11-03 NOTE — Progress Notes (Signed)
Patient arrives to have her catheter removed and staples removed.  I removed catheter and patient was swollen at the mons.  She stated that a Latex catheter was inserted twice during her delivery, even though she is allergic to Latex.  She was unable to void so they sent her home with a Latex free catheter.  Today she was able to void before leaving the office.  She knows to report to [facility] hospital if she feels her bladder is getting too full and she is unable to void.  Staples were removed and steri strips were placed.  The incision is clean and dry with very minimal drainage.  She will RTC in 6 weeks for post partom care, sooner if needed.  She is breast feeding and that is going very well for her.  The baby is taking to the feedings well.  She does have support at home with her boyfriend Billey Gosling who was here with her today.  They are taking turns sleeping so they can both get some rest.

## 2010-11-03 NOTE — Progress Notes (Signed)
Addended by: Barbara Cower on: 11/03/2010 05:49 PM   Modules accepted: Level of Service

## 2010-11-11 ENCOUNTER — Telehealth: Payer: Self-pay | Admitting: *Deleted

## 2010-11-11 NOTE — Telephone Encounter (Signed)
Patient was left a message to come to [facility] to be evaluated as she has taken 60 tablets in the past 10 days, and if she has needed to take this much medication she should be evaluated at the hospital as we can not see her until Monday.

## 2010-11-15 ENCOUNTER — Encounter: Payer: Self-pay | Admitting: Obstetrics & Gynecology

## 2010-11-15 ENCOUNTER — Ambulatory Visit (INDEPENDENT_AMBULATORY_CARE_PROVIDER_SITE_OTHER): Payer: Medicaid Other | Admitting: Obstetrics & Gynecology

## 2010-11-15 VITALS — BP 100/60 | HR 75 | Wt 138.0 lb

## 2010-11-15 DIAGNOSIS — G8918 Other acute postprocedural pain: Secondary | ICD-10-CM

## 2010-11-15 MED ORDER — OXYCODONE HCL 5 MG PO TABS: 5.0000 mg | ORAL_TABLET | ORAL | Status: AC | PRN

## 2010-11-15 NOTE — Progress Notes (Signed)
Patient reports incisional pain, about two weeks postpartum.  No other symptoms. On exam, AFVSS,  incision is without erythema, induration or any other concerning findings.  No fundal tenderness. Patient was reassured but still wants some analgesia.  She is allergic to Tylenol and NSAIDs.  She was given a Rx for Oxycodone 5 mg, #40 tablets.  Will continue to monitor. Return for 6 week postpartum check.

## 2010-11-15 NOTE — Progress Notes (Signed)
Addended by: Jaynie Collins A on: 11/15/2010 01:48 PM   Modules accepted: Level of Service

## 2010-12-13 ENCOUNTER — Encounter: Payer: Self-pay | Admitting: Obstetrics & Gynecology

## 2010-12-13 ENCOUNTER — Ambulatory Visit (INDEPENDENT_AMBULATORY_CARE_PROVIDER_SITE_OTHER): Payer: Medicaid Other | Admitting: Obstetrics & Gynecology

## 2010-12-13 VITALS — BP 95/65 | HR 80 | Ht 61.0 in | Wt 142.0 lb

## 2010-12-13 DIAGNOSIS — Z9889 Other specified postprocedural states: Secondary | ICD-10-CM

## 2010-12-13 NOTE — Progress Notes (Signed)
  Subjective:    Patient ID: Viann Nielson, female    DOB: December 08, 1990, 20 y.o.   MRN: [id number]  HPI  Cherissa is here for a post op/partum check. She is still having urinary issues- says she doesn't feel the urge to void. She makes herself void about twice per day. She hasn't had sex yet and wants Mirena. Wants another baby in about 3-4 years. Says she is breast and bottle feeding. She isn't currently seeing a psychiatrist for her bipolar and declines one today.  Review of Systems     Objective:   Physical Exam   Incision healed well EG appears normal     Assessment & Plan:  Pp-stable. For Mirena this week Urinary issues-urologist ([place] per husband's request)

## 2010-12-13 NOTE — Progress Notes (Signed)
Patient is here for post partum.  She is breastfeeding and suplamenting with formula.  Up until yesterday she was still having tissue like clots coming out.  The bleeding had stopped for a couple days then the clots returned.  She is having mid to low back pain that radiates into legs sometimes, it has been very bad some days and just uncomfortable other days. She still has to force herself to urinate.  She is not getting the urges to urinate, she has to sit down and push to make herself pee.

## 2010-12-21 ENCOUNTER — Encounter: Payer: Self-pay | Admitting: Physician Assistant

## 2010-12-21 ENCOUNTER — Ambulatory Visit (INDEPENDENT_AMBULATORY_CARE_PROVIDER_SITE_OTHER): Payer: Medicaid Other | Admitting: Physician Assistant

## 2010-12-21 VITALS — BP 110/71 | HR 73 | Ht 61.0 in | Wt 140.2 lb

## 2010-12-21 DIAGNOSIS — Z30430 Encounter for insertion of intrauterine contraceptive device: Secondary | ICD-10-CM

## 2010-12-21 DIAGNOSIS — Z01812 Encounter for preprocedural laboratory examination: Secondary | ICD-10-CM

## 2010-12-21 MED ORDER — LEVONORGESTREL 20 MCG/24HR IU IUD: 1.0000 | INTRAUTERINE_SYSTEM | Freq: Once | INTRAUTERINE | Status: DC

## 2010-12-21 NOTE — Patient Instructions (Signed)

## 2010-12-21 NOTE — Progress Notes (Signed)
Presents for Mirena IUD insertion. Patient identified, informed consent performed, signed copy in chart, time out was performed.  Urine pregnancy test negative.  Speculum placed in the vagina.  Cervix visualized.  Cleaned with Betadine x 2.  Grasped anteriourly with a single tooth tenaculum.  Uterus sounded to 8.5cm.  Mirena IUD placed per manufacturer's recommendations.  Strings trimmed to 1-2 cm.   Patient given post procedure instructions and Mirena care card with expiration date.  Patient is asked to check IUD strings periodically and follow up in 4-6 weeks for IUD check.

## 2011-01-13 ENCOUNTER — Ambulatory Visit: Payer: Medicaid Other | Admitting: Obstetrics & Gynecology

## 2011-01-18 DIAGNOSIS — R34 Anuria and oliguria: Secondary | ICD-10-CM

## 2011-01-18 HISTORY — DX: Anuria and oliguria: R34

## 2011-03-16 ENCOUNTER — Encounter: Payer: Self-pay | Admitting: Obstetrics and Gynecology

## 2011-03-16 ENCOUNTER — Ambulatory Visit (INDEPENDENT_AMBULATORY_CARE_PROVIDER_SITE_OTHER): Payer: Medicaid Other | Admitting: Obstetrics and Gynecology

## 2011-03-16 VITALS — BP 109/71 | HR 73 | Ht 61.0 in | Wt 138.0 lb

## 2011-03-16 DIAGNOSIS — Z87898 Personal history of other specified conditions: Secondary | ICD-10-CM

## 2011-03-16 DIAGNOSIS — Z87448 Personal history of other diseases of urinary system: Secondary | ICD-10-CM

## 2011-03-16 DIAGNOSIS — N939 Abnormal uterine and vaginal bleeding, unspecified: Secondary | ICD-10-CM

## 2011-03-16 MED ORDER — MEDROXYPROGESTERONE ACETATE 150 MG/ML IM SUSP
150.0000 mg | INTRAMUSCULAR | Status: DC
  Administered 2011-03-16: 150 mg via INTRAMUSCULAR

## 2011-03-16 NOTE — Progress Notes (Signed)
21 yo G2P1101 presenting today for evaluation of abnormal uterine bleeding. Patient reports daily spotting since the placement of her IUD on 12/4. Patient was reminded that irregular vaginal bleeding may occur for up to 6 months following the initial placement of the IUD.  Discussed expectant management and medical treatment with OCP or depo-provera. Patient opted for medical management with depo-provera. Will order pelvic ultrasound to confirm placement of IUD. IUD strings were visualized at the external os on speculum exam.  Patient to return in 4-5 months if abnormal bleeding persists Referral to urologist also provided as patient never followed up with them following urinary retention due to the use of latex catheter in the setting of a patient with a latex allergy.

## 2011-03-22 ENCOUNTER — Ambulatory Visit (HOSPITAL_COMMUNITY): Admission: RE | Admit: 2011-03-22 | Payer: Medicaid Other | Source: Ambulatory Visit

## 2011-03-30 ENCOUNTER — Encounter: Payer: Self-pay | Admitting: Obstetrics and Gynecology

## 2011-03-30 LAB — US OB TRANSVAGINAL

## 2011-03-31 ENCOUNTER — Ambulatory Visit: Payer: Self-pay | Admitting: Urology

## 2011-04-04 ENCOUNTER — Ambulatory Visit: Payer: Self-pay | Admitting: Urology

## 2011-04-04 ENCOUNTER — Emergency Department: Payer: Self-pay | Admitting: Emergency Medicine

## 2011-04-04 HISTORY — PX: OTHER SURGICAL HISTORY: SHX169

## 2011-04-04 LAB — PREGNANCY, URINE: Pregnancy Test, Urine: NEGATIVE m[IU]/mL

## 2011-10-26 ENCOUNTER — Inpatient Hospital Stay: Payer: Self-pay | Admitting: Psychiatry

## 2011-10-26 LAB — CBC
HCT: 39 % (ref 35.0–47.0)
MCH: 32.1 pg (ref 26.0–34.0)
MCV: 94 fL (ref 80–100)
RBC: 4.15 10*6/uL (ref 3.80–5.20)
WBC: 8.4 10*3/uL (ref 3.6–11.0)

## 2011-10-26 LAB — COMPREHENSIVE METABOLIC PANEL
Albumin: 3.7 g/dL (ref 3.4–5.0)
BUN: 12 mg/dL (ref 7–18)
Chloride: 108 mmol/L — ABNORMAL HIGH (ref 98–107)
EGFR (African American): >60
Glucose: 133 mg/dL — ABNORMAL HIGH (ref 65–99)
SGOT(AST): 13 U/L — ABNORMAL LOW (ref 15–37)
SGPT (ALT): 13 U/L (ref 12–78)
Total Protein: 7.2 g/dL (ref 6.4–8.2)

## 2011-10-26 LAB — TSH: Thyroid Stimulating Horm: 0.91 u[IU]/mL

## 2011-10-26 LAB — DRUG SCREEN, URINE
Amphetamines, Ur Screen: NEGATIVE (ref ?–1000)
Benzodiazepine, Ur Scrn: NEGATIVE (ref ?–200)
Cannabinoid 50 Ng, Ur ~~LOC~~: POSITIVE (ref ?–50)
MDMA (Ecstasy)Ur Screen: NEGATIVE (ref ?–500)
Methadone, Ur Screen: NEGATIVE (ref ?–300)
Tricyclic, Ur Screen: NEGATIVE (ref ?–1000)

## 2011-10-26 LAB — SALICYLATE LEVEL: Salicylates, Serum: <1.7 mg/dL

## 2011-10-26 LAB — ETHANOL: Ethanol %: <0.003 % (ref 0.000–0.080)

## 2011-10-27 LAB — LIPID PANEL: Ldl Cholesterol, Calc: 30 mg/dL (ref 0–100)

## 2011-10-27 LAB — VALPROIC ACID LEVEL: Valproic Acid: 23 ug/mL — ABNORMAL LOW

## 2011-10-28 LAB — URINALYSIS, COMPLETE
Glucose,UR: NEGATIVE mg/dL (ref 0–75)
Nitrite: NEGATIVE
Protein: 30
RBC,UR: 22 /HPF (ref 0–5)
Transitional Epi: 6

## 2011-10-31 LAB — COMPREHENSIVE METABOLIC PANEL
Albumin: 3.3 g/dL — ABNORMAL LOW (ref 3.4–5.0)
Alkaline Phosphatase: 118 U/L (ref 50–136)
Anion Gap: 8 (ref 7–16)
BUN: 6 mg/dL — ABNORMAL LOW (ref 7–18)
Bilirubin,Total: 0.3 mg/dL (ref 0.2–1.0)
Calcium, Total: 8.9 mg/dL (ref 8.5–10.1)
Creatinine: 0.63 mg/dL (ref 0.60–1.30)
Glucose: 79 mg/dL (ref 65–99)
Osmolality: 280 (ref 275–301)
Potassium: 3.9 mmol/L (ref 3.5–5.1)
Sodium: 142 mmol/L (ref 136–145)
Total Protein: 7.1 g/dL (ref 6.4–8.2)

## 2011-10-31 LAB — VALPROIC ACID LEVEL: Valproic Acid: 87 ug/mL

## 2012-05-26 ENCOUNTER — Inpatient Hospital Stay: Payer: Self-pay | Admitting: Psychiatry

## 2012-05-26 LAB — SALICYLATE LEVEL: Salicylates, Serum: 3 mg/dL — ABNORMAL HIGH

## 2012-05-26 LAB — CBC
HCT: 43.8 % (ref 35.0–47.0)
HGB: 15.3 g/dL (ref 12.0–16.0)
Platelet: 228 10*3/uL (ref 150–440)
RBC: 4.75 10*6/uL (ref 3.80–5.20)
WBC: 9.1 10*3/uL (ref 3.6–11.0)

## 2012-05-26 LAB — URINALYSIS, COMPLETE
Bilirubin,UR: NEGATIVE
Blood: NEGATIVE
Ketone: NEGATIVE
Ph: 6 (ref 4.5–8.0)
Protein: NEGATIVE
WBC UR: 59 /HPF (ref 0–5)

## 2012-05-26 LAB — COMPREHENSIVE METABOLIC PANEL
Albumin: 4.6 g/dL (ref 3.4–5.0)
Bilirubin,Total: 0.3 mg/dL (ref 0.2–1.0)
Calcium, Total: 9.5 mg/dL (ref 8.5–10.1)
EGFR (African American): >60
Glucose: 71 mg/dL (ref 65–99)
Osmolality: 280 (ref 275–301)
Potassium: 3.4 mmol/L — ABNORMAL LOW (ref 3.5–5.1)
SGOT(AST): 29 U/L (ref 15–37)
Sodium: 142 mmol/L (ref 136–145)
Total Protein: 8.7 g/dL — ABNORMAL HIGH (ref 6.4–8.2)

## 2012-05-26 LAB — DRUG SCREEN, URINE
Benzodiazepine, Ur Scrn: NEGATIVE (ref ?–200)
Cannabinoid 50 Ng, Ur ~~LOC~~: POSITIVE (ref ?–50)
Cocaine Metabolite,Ur ~~LOC~~: NEGATIVE (ref ?–300)
MDMA (Ecstasy)Ur Screen: NEGATIVE (ref ?–500)
Methadone, Ur Screen: NEGATIVE (ref ?–300)

## 2012-05-26 LAB — ETHANOL: Ethanol %: 0.162 % — ABNORMAL HIGH (ref 0.000–0.080)

## 2012-05-26 LAB — TSH: Thyroid Stimulating Horm: 0.885 u[IU]/mL

## 2012-05-26 LAB — ACETAMINOPHEN LEVEL: Acetaminophen: <2 ug/mL

## 2012-08-20 ENCOUNTER — Encounter: Payer: Self-pay | Admitting: Obstetrics & Gynecology

## 2012-12-18 ENCOUNTER — Observation Stay: Payer: Self-pay | Admitting: Obstetrics and Gynecology

## 2012-12-18 LAB — URINALYSIS, COMPLETE
Bacteria: NONE SEEN
Bilirubin,UR: NEGATIVE
Blood: NEGATIVE
Glucose,UR: NEGATIVE mg/dL (ref 0–75)
Ketone: NEGATIVE
Ph: 8 (ref 4.5–8.0)
RBC,UR: NONE SEEN /HPF (ref 0–5)
Specific Gravity: 1.012 (ref 1.003–1.030)

## 2012-12-20 LAB — URINE CULTURE

## 2013-11-18 ENCOUNTER — Encounter: Payer: Self-pay | Admitting: Obstetrics and Gynecology
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
## 2014-05-09 NOTE — Discharge Summary (Signed)
PATIENT NAME:  Tammy Snyder, Tammy Snyder MR#:  [id number] DATE OF BIRTH:  12/12/1990  DATE OF ADMISSION:  05/26/2012 DATE OF DISCHARGE:  05/29/2012  HOSPITAL COURSE: See dictated history and physical for details of admission. This 24 year old woman came into the hospital after using alcohol along with Klonopin, was reporting depressed mood. In the hospital, she has been initially tired and withdrawn, but then has gotten up, participated and gone to some groups. In the interview, she totally denies any suicidal ideation. She denies any psychotic symptoms. She understands that she needs to avoid alcohol because of her history of mood instability and particularly should not mix it with her medication. The patient appears to be motivated to continue with her outpatient treatment. She has set up with [facility] and finds the therapy and medication management to be helpful. She has no thought of wanting to harm herself and has positive things in her life to live for, especially her child. Affect has been euthymic to positive. She has not displayed any dangerous behaviors in the hospital and does not show any signs of withdrawal. The patient is to be discharged today on her current medications which are essentially the same as what she came in on, but without the clonazepam. She is to follow up with [facility] in the community.   DISCHARGE MEDICATIONS: Ciprofloxacin 500 mg twice a day for 6 more days, Celexa 40 mg per day, Depakote 750 mg at night, Elmiron 100 mg 3 times a day, quetiapine 150 mg at night, triamcinolone cream 0.5% to the hands b.i.d.   MENTAL STATUS EXAMINATION AT DISCHARGE: Casually dressed, reasonably well-groomed woman, looks her stated age. Cooperative with the interview. Reasonably good eye contact, normal psychomotor activity. Speech is normal in rate, tone and volume. Affect is euthymic, reactive, appropriate. Mood stated as good. Thoughts are lucid without loosening of associations, no sign of delusions.  Denies auditory or visual hallucinations. Denies any suicidal or homicidal ideation. Shows improved judgment and insight. Normal intelligence.   LABORATORY DATA ON ADMISSION: CBC normal. Chemistry showed a low potassium 3.4, chloride slightly elevated 110, CO2 slightly low at 20, alkaline phosphatase slightly elevated at 157, total protein slightly elevated 8.7. Alcohol level was 162 on the 10th at 3:30 in the afternoon. TSH normal at 0.8. Urine analysis highly positive for signs of a urinary tract infection, hence the ciprofloxacin. Drug screen positive for cannabis. Salicylates slightly high at 3, but not toxic. Lipase 102 which is normal.   DISPOSITION: Discharged back home to stay with family. Follow up with [facility].   DIAGNOSIS PRINCIPAL AND PRIMARY: AXIS I: Mood disorder, not otherwise specified.   SECONDARY DIAGNOSES:  AXIS I:  a. Alcohol abuse.  b. Cannabis abuse.  AXIS II: Borderline personality disorder.  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  AXIS IV: Moderate chronic stress from being a single mother.  AXIS V: Functioning at time of discharge 60.     ____________________________ Audery AmelJohn T. Clapacs, MD jtc:es D: 05/29/2012 11:47:30 ET T: 05/29/2012 12:12:09 ET JOB#: [id number]  cc: Audery AmelJohn T. Clapacs, MD, <Dictator> Audery AmelJOHN T CLAPACS MD ELECTRONICALLY SIGNED 05/29/2012 12:39
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
## 2014-05-11 NOTE — Op Note (Signed)
PATIENT NAME:  Tammy Snyder, Tammy Snyder MR#:  [id number] DATE OF BIRTH:  1990/08/16  DATE OF PROCEDURE:  04/04/2011  PREOPERATIVE DIAGNOSES:  1. Oliguria. 2. Urethral stenosis.  POSTOPERATIVE DIAGNOSES:  1. Interstitial cystitis. 2. Urethral stent stenosis.   OPERATION:  1. Cystoscopy, hydrodilation under anesthesia.  2. Calibration of urethra with dilation of the urethra under anesthesia.   SURGEON: Rica KoyanagiJohn S. Naveah Brave, MD   ANESTHESIA: General.   INDICATIONS: This 24 year old woman presented with a history of very little urinary output. She also has abdominal pain of a vague nature. These symptoms began with the birth of her baby. At that time she experienced an allergic reaction to latex. Since that time her voiding pattern has been erratic. She feels that she produces very little urine though she feels that her intake is normal. Examination in the office was not possible because of sensitivity. She is scheduled for cystoscopic examination under anesthesia with hydrodilation and any other corrective procedure which was found to be needed.   PROCEDURE: In the dorsal lithotomy position under general anesthesia, the lower abdomen and genital area was prepped and draped for endoscopic work. The urethra was calibrated and found to have hang in the distal portion with a #22 [place]. It was dilated under anesthesia to 28 [place]. Cystoscopic examination was performed revealing marked hyperemia on initial exam. Hydrodilation was done, the first dilatation accepting 700 mL, the second 800, and the third 875. Massive numbers of glomeruli were identified following hydrodilation. At the conclusion of hydrodilation, a thorough inspection was performed. No other significant abnormalities of the bladder were identified. An indwelling silicone catheter was left in place.   The patient tolerated the procedure well and returned to the recovery area in satisfactory condition.  ____________________________ Rica KoyanagiJohn S. Leasha Goldberger,  MD jsh:drc D: 04/04/2011 15:36:24 ET T: 04/05/2011 09:34:27 ET JOB#: [id number]  cc: Rica KoyanagiJohn S. Whyatt Klinger, MD, <Dictator> Rica KoyanagiJOHN S Kiana Hollar MD ELECTRONICALLY SIGNED 04/05/2011 13:06

## 2014-05-27 NOTE — H&P (Signed)
L&D Evaluation:  History:  HPI 24 yo SWF G2P1001, EGA [redacted] weeks, prior C/S, Prenatal risk factors include Interstitial Cystitis and Bipolar Disorder, presents with URI symptoms/ Pelvic Pressure/? of PML. No UTI symptoms. No fever. No Vaginal Bleeding. 2 episodes of vomiting. Prenatal care is from [facility].   Patient's Medical History Bipolar; Interstitial Cystitis   Patient's Surgical History Previous C-Section   Medications Pre Natal Vitamins  Iron  Haldol, Folic Acid   Allergies NSAIDS, Tylenol   Social History tobacco   Family History Non-Contributory   ROS:  ROS per HPI   Exam:  Vital Signs stable   Urine Protein negative dipstick   General no apparent distress, Normal pharynx/ nasal turbinates; no lymphadenopathy; Minimal Fron tal and maxillary sinus tenderness   Mental Status clear   Heart normal sinus rhythm   Abdomen gravid, non-tender, Uterus nontender   Estimated Fetal Weight Average for gestational age   Fetal Position Vtx   Back no CVAT   Edema no edema   Pelvic no external lesions, Loing/soft/closed/high   Mebranes Intact   FHT Reactive NST   Ucx absent   Skin dry, no lesions   Lymph no lymphadenopathy   Other UA negative   Impression:  Impression reactive NST, 31 wweek Intrauterine pregnancy; URI; ; no evidence of Labor, UTI/Pyelo; H/O Interstitial Cystitis   Plan:  Plan Reassurance; Decongestant/Antihistamine prn;l Increase PO fluids   Comments F/U with [facility] this week.   Electronic Signatures: Faithann Natal, Prentice DockerMartin A (MD)  (Signed 02-Dec-14 18:13)  Authored: L&D Evaluation   Last Updated: 02-Dec-14 18:13 by Comfort Iversen, Prentice DockerMartin A (MD)

## 2014-06-27 ENCOUNTER — Ambulatory Visit (INDEPENDENT_AMBULATORY_CARE_PROVIDER_SITE_OTHER): Payer: Self-pay | Admitting: Urology

## 2014-06-27 ENCOUNTER — Encounter: Payer: Self-pay | Admitting: Urology

## 2014-06-27 VITALS — BP 116/78 | HR 71 | Ht 60.5 in | Wt 137.8 lb

## 2014-06-27 DIAGNOSIS — R350 Frequency of micturition: Secondary | ICD-10-CM

## 2014-06-27 DIAGNOSIS — R102 Pelvic and perineal pain: Secondary | ICD-10-CM

## 2014-06-27 DIAGNOSIS — N3010 Interstitial cystitis (chronic) without hematuria: Secondary | ICD-10-CM

## 2014-06-27 DIAGNOSIS — R300 Dysuria: Secondary | ICD-10-CM

## 2014-06-27 LAB — URINALYSIS, COMPLETE
Bilirubin, UA: NEGATIVE
GLUCOSE, UA: NEGATIVE
Nitrite, UA: NEGATIVE
PH UA: 6 (ref 5.0–7.5)
RBC UA: NEGATIVE
Specific Gravity, UA: 1.025 (ref 1.005–1.030)
UUROB: 1 mg/dL (ref 0.2–1.0)

## 2014-06-27 LAB — MICROSCOPIC EXAMINATION

## 2014-06-27 LAB — BLADDER SCAN AMB NON-IMAGING: Scan Result: 19

## 2014-06-27 MED ORDER — URIBEL 118 MG PO CAPS: 1.0000 | ORAL_CAPSULE | Freq: Four times a day (QID) | ORAL | Status: DC | PRN

## 2014-06-27 MED ORDER — SOLIFENACIN SUCCINATE 5 MG PO TABS: 5.0000 mg | ORAL_TABLET | Freq: Every day | ORAL | Status: DC

## 2014-06-27 NOTE — Patient Instructions (Signed)

## 2014-06-27 NOTE — Progress Notes (Signed)
06/27/2014 12:01 PM   Tammy Snyder 05/30/1990 [id number]  Referring provider: No referring provider defined for this encounter.  Chief Complaint  Patient presents with  . Cystitis    interstial ,     HPI: 23 yo F previously dx with IC in 2012/2013 by Dr.Harmon.  She underwent hydrodistention/ urethral dilation and was treated with Elmiron x 1 year and had to stop taking it when she became pregnant with her son.   She reports that she has a severe latex allergy and her symptoms of IC only started after she was catheterized after birth with a latex catheter.  She gets up 6-10 times per night to urinate.  She has blood in her urine and dysuria chronically since April.  She does report urinary frequency, urgency which has been debilitating.  She also has chronic low back pain.  She reports that she had doing fairly well until 1-2 months ago when she began experiencing this most recent "flair".    She does stick to the IC diet and is very aware of these triggers.    She does report of loose stool up to 4x daily and lower abdominal cramping.  She has had no further work up for this.  No history of kideny stones.    She also gets treat for UTIs q2 months by her PCP at [facility].  She reports that they do perform cutlures prior to treating her for the infection.  Urine culture data is not available for review today.  She denies fevers, chills, or flank pain with her infections.    She does have some SUI, 1 ppd (one panty liner).   P2G2.  Csection x 1 and VBAC.    PMH: Past Medical History  Diagnosis Date  . No pertinent past medical history   . Bipolar 1 disorder   . Mental disorder     Borderline personalities disorder  . Vaginal Pap smear, abnormal   . Borderline personality disorder   . Oliguria 2013    cystoscopy, hydrodilation under anesthesia    Surgical History: Past Surgical History  Procedure Laterality Date  . Cochlear implant    . Cesarean  section  10/29/2010    Procedure: CESAREAN SECTION;  Surgeon: Lazaro Arms, MD;  Location: [facility];  Service: Gynecology;;  . Cochlear implant    . Cystoscopy, hydrodilation under anesthesia  04/04/2011  . Calibration of urethra with dilation of the urethra under anesthesia  04/04/2011    Dr. Rosezetta Schlatter    Home Medications:    Medication List       This list is accurate as of: 06/27/14 11:59 PM.  Always use your most recent med list.               HYDROmorphone 4 MG tablet  Commonly known as:  DILAUDID  Take 4 mg by mouth every 4 (four) hours as needed.     prenatal multivitamin 90-600-400 MG-MCG-MCG tablet  Take 1 tablet by mouth daily.     solifenacin 5 MG tablet  Commonly known as:  VESICARE  Take 1 tablet (5 mg total) by mouth daily.     URIBEL 118 MG Caps  Take 1 capsule (118 mg total) by mouth 4 (four) times daily as needed (dysuria/ pelvic pain).        Allergies:  Allergies  Allergen Reactions  . Peanuts [Nuts] Anaphylaxis  . Latex Rash  . Nsaids Rash  . Tylenol [Acetaminophen] Itching and Rash  Family History: Family History  Problem Relation Age of Onset  . Hypertension Father   . Stroke Father     Social History:  reports that she has been smoking Cigarettes.  She has a 8 pack-year smoking history. She has never used smokeless tobacco. She reports that she does not drink alcohol or use illicit drugs.  She has two young children and is also the primary care taker for her father.    ROS: Urological Symptom Review  Patient is experiencing the following symptoms: Frequent urination Burning/pain with urination Get up at night to urinate Leakage of urine Stream starts and stops Trouble starting stream Have to strain to urinate Blood in urine Urinary tract infection Injury to kidneys/bladder Weak stream   Review of Systems  Gastrointestinal (upper)  : Negative for upper GI symptoms  Gastrointestinal (lower)  : Diarrhea  Constitutional : Negative for symptoms  Skin: Negative for skin symptoms  Eyes: Negative for eye symptoms  Ear/Nose/Throat : Negative for Ear/Nose/Throat symptoms  Hematologic/Lymphatic: Negative for Hematologic/Lymphatic symptoms  Cardiovascular : Negative for cardiovascular symptoms  Respiratory : Negative for respiratory symptoms  Endocrine: Negative for endocrine symptoms  Musculoskeletal: Negative for musculoskeletal symptoms  Neurological: Negative for neurological symptoms  Psychologic: Anxiety   Physical Exam: BP 116/78 mmHg  Pulse 71  Ht 5' 0.5" (1.537 m)  Wt 137 lb 12.8 oz (62.506 kg)  BMI 26.46 kg/m2  LMP  (LMP Unknown)  Constitutional:  Alert and oriented, No acute distress. HEENT: [place] AT, moist mucus membranes.  Trachea midline, no masses. Cardiovascular: No clubbing, cyanosis, or edema. Respiratory: Normal respiratory effort, no increased work of breathing. GI: Abdomen is soft, nontender, nondistended, no abdominal masses. GU: No CVA tenderness. + suprapubic tenderness with deep palpation.   Skin: No rashes, bruises or suspicious lesions. Lymph: No cervical or inguinal adenopathy. Neurologic: Grossly intact, no focal deficits, moving all 4 extremities. Psychiatric: Normal mood and affect.  Laboratory Data: Lab Results  Component Value Date   WBC 9.1 05/26/2012   HGB 15.3 05/26/2012   HCT 43.8 05/26/2012   MCV 92 05/26/2012   PLT 228 05/26/2012    Lab Results  Component Value Date   CREATININE 0.74 05/26/2012    Urinalysis Results for orders placed or performed in visit on 06/27/14  Microscopic Examination  Result Value Ref Range   WBC, UA 6-10 (A) 0 -  5 /hpf   RBC, UA 0-2 0 -  2 /hpf   Epithelial Cells (non renal) 0-10 0 - 10 /hpf   Mucus, UA Present (A) Not Estab.   Bacteria, UA Few None seen/Few  Urinalysis, Complete  Result Value Ref Range   Specific Gravity, UA 1.025 1.005 - 1.030   pH, UA 6.0 5.0 - 7.5    Color, UA Yellow Yellow   Appearance Ur Hazy (A) Clear   Leukocytes, UA Trace (A) Negative   Protein, UA Trace (A) Negative/Trace   Glucose, UA Negative Negative   Ketones, UA Trace (A) Negative   RBC, UA Negative Negative   Bilirubin, UA Negative Negative   Urobilinogen, Ur 1.0 0.2 - 1.0 mg/dL   Nitrite, UA Negative Negative   Microscopic Examination See below:   Bladder Scan (Post Void Residual) in office  Result Value Ref Range   Scan Result 19     Assessment & Plan:  24 yo F with history of IC who present today with symptoms which she relates to a flair.  She is s/p hydodistention and previously on Elmiron  x 1 year.  She currently closely follows the IC diet.    1. Interstitial cystitis We discussed today that the treatment for IC starts with behavior modification followed by medications for symptomatic relief. Her primary symptoms today include urinary frequency and dysuria. As such, we will start with medications including Uribel and Vesicare 5 mg.  Common side effects of these medications were discussed in detail.  She will call us in several weeks if her pain does not improve. Plan to send urine culture today to rule out infection is contributed infection which her current symptoms. - Urinalysis, Complete - Bladder Scan (Post Void Residual) in office - Microscopic Examination  2. Dysuria - Meth-Hyo-M Bl-Na Phos-Ph Sal (URIBEL) 118 MG CAPS; Take 1 capsule (118 mg total) by mouth 4 (four) times daily as needed (dysuria/ pelvic pain).  Dispense: 120 capsule; Refill: 3 - CULTURE, URINE COMPREHENSIVE  3. Urinary frequency - solifenacin (VESICARE) 5 MG tablet; Take 1 tablet (5 mg total) by mouth daily.  Dispense: 30 tablet; Refill: 6  4. Pelvic pain in female   Return in about 3 months (around 09/27/2014).  Vanna Scotland, MD  [facility] [address] [place], [place] [address] [phone]

## 2014-06-29 LAB — CULTURE, URINE COMPREHENSIVE

## 2014-06-30 ENCOUNTER — Telehealth: Payer: Self-pay | Admitting: Urology

## 2014-06-30 MED ORDER — CIPROFLOXACIN HCL 500 MG PO TABS: 500.0000 mg | ORAL_TABLET | Freq: Two times a day (BID) | ORAL | Status: DC

## 2014-06-30 NOTE — Telephone Encounter (Signed)
LVM requesting a call-back for lab results and Rx information.

## 2014-06-30 NOTE — Telephone Encounter (Signed)
Please call patient to let her know that she had a UTI, E. Coli bacteria grew.  This is likely contributing to her symptoms.  I sent cipro 500 mg po x 3 days to her pharmacy.    Vanna Scotland, MD

## 2014-07-01 NOTE — Telephone Encounter (Signed)
Spoke with pt grandmother, Carolin Guernsey, if reference to infection. Darel Hong stated she will go pick up abt for pt. Cw,lpn

## 2015-01-19 ENCOUNTER — Emergency Department
Admission: EM | Admit: 2015-01-19 | Discharge: 2015-01-19 | Disposition: A | Discharge status: Home / Self Care | Payer: Medicaid Other | Attending: Emergency Medicine | Admitting: Emergency Medicine

## 2015-01-19 ENCOUNTER — Encounter: Payer: Self-pay | Admitting: Emergency Medicine

## 2015-01-19 DIAGNOSIS — Z91040 Latex allergy status: Secondary | ICD-10-CM | POA: Diagnosis not present

## 2015-01-19 DIAGNOSIS — F419 Anxiety disorder, unspecified: Secondary | ICD-10-CM | POA: Diagnosis not present

## 2015-01-19 DIAGNOSIS — Z79899 Other long term (current) drug therapy: Secondary | ICD-10-CM | POA: Insufficient documentation

## 2015-01-19 DIAGNOSIS — Z3202 Encounter for pregnancy test, result negative: Secondary | ICD-10-CM | POA: Diagnosis not present

## 2015-01-19 DIAGNOSIS — N12 Tubulo-interstitial nephritis, not specified as acute or chronic: Secondary | ICD-10-CM | POA: Insufficient documentation

## 2015-01-19 DIAGNOSIS — F17210 Nicotine dependence, cigarettes, uncomplicated: Secondary | ICD-10-CM | POA: Diagnosis not present

## 2015-01-19 DIAGNOSIS — R109 Unspecified abdominal pain: Secondary | ICD-10-CM | POA: Diagnosis present

## 2015-01-19 LAB — URINALYSIS COMPLETE WITH MICROSCOPIC (ARMC ONLY)
Bilirubin Urine: NEGATIVE
GLUCOSE, UA: NEGATIVE mg/dL
Ketones, ur: NEGATIVE mg/dL
Nitrite: NEGATIVE
PH: 9 — AB (ref 5.0–8.0)
Protein, ur: 100 mg/dL — AB
Specific Gravity, Urine: 1.015 (ref 1.005–1.030)

## 2015-01-19 LAB — COMPREHENSIVE METABOLIC PANEL
ALT: 30 U/L (ref 14–54)
AST: 26 U/L (ref 15–41)
Albumin: 4.2 g/dL (ref 3.5–5.0)
Alkaline Phosphatase: 129 U/L — ABNORMAL HIGH (ref 38–126)
Anion gap: 6 (ref 5–15)
BUN: 7 mg/dL (ref 6–20)
CHLORIDE: 108 mmol/L (ref 101–111)
CO2: 25 mmol/L (ref 22–32)
Calcium: 9 mg/dL (ref 8.9–10.3)
Creatinine, Ser: 0.66 mg/dL (ref 0.44–1.00)
Glucose, Bld: 112 mg/dL — ABNORMAL HIGH (ref 65–99)
POTASSIUM: 3.5 mmol/L (ref 3.5–5.1)
SODIUM: 139 mmol/L (ref 135–145)
Total Bilirubin: 0.6 mg/dL (ref 0.3–1.2)
Total Protein: 7.1 g/dL (ref 6.5–8.1)

## 2015-01-19 LAB — CBC
HCT: 39.1 % (ref 35.0–47.0)
Hemoglobin: 13.7 g/dL (ref 12.0–16.0)
MCH: 32.1 pg (ref 26.0–34.0)
MCHC: 34.9 g/dL (ref 32.0–36.0)
MCV: 91.8 fL (ref 80.0–100.0)
PLATELETS: 140 10*3/uL — AB (ref 150–440)
RBC: 4.26 MIL/uL (ref 3.80–5.20)
RDW: 13.6 % (ref 11.5–14.5)
WBC: 10.9 10*3/uL (ref 3.6–11.0)

## 2015-01-19 LAB — POCT PREGNANCY, URINE: PREG TEST UR: NEGATIVE

## 2015-01-19 LAB — LIPASE, BLOOD: LIPASE: 26 U/L (ref 11–51)

## 2015-01-19 MED ORDER — MORPHINE SULFATE (PF) 4 MG/ML IV SOLN
4.0000 mg | Freq: Once | INTRAVENOUS | Status: AC
  Administered 2015-01-19: 4 mg via INTRAVENOUS
  Filled 2015-01-19: qty 1

## 2015-01-19 MED ORDER — MORPHINE SULFATE (PF) 4 MG/ML IV SOLN
4.0000 mg | Freq: Once | INTRAVENOUS | Status: AC
  Administered 2015-01-19: 4 mg via INTRAVENOUS

## 2015-01-19 MED ORDER — ONDANSETRON HCL 4 MG PO TABS: 4.0000 mg | ORAL_TABLET | Freq: Every day | ORAL | Status: DC | PRN

## 2015-01-19 MED ORDER — ONDANSETRON HCL 4 MG/2ML IJ SOLN
4.0000 mg | Freq: Once | INTRAMUSCULAR | Status: AC
  Administered 2015-01-19: 4 mg via INTRAVENOUS
  Filled 2015-01-19: qty 2

## 2015-01-19 MED ORDER — CIPROFLOXACIN HCL 500 MG PO TABS: 500.0000 mg | ORAL_TABLET | Freq: Two times a day (BID) | ORAL | Status: AC

## 2015-01-19 MED ORDER — MORPHINE SULFATE (PF) 4 MG/ML IV SOLN
INTRAVENOUS | Status: AC
  Administered 2015-01-19: 4 mg via INTRAVENOUS
  Filled 2015-01-19: qty 1

## 2015-01-19 MED ORDER — TRAMADOL HCL 50 MG PO TABS: 50.0000 mg | ORAL_TABLET | Freq: Four times a day (QID) | ORAL | Status: DC | PRN

## 2015-01-19 MED ORDER — CEFTRIAXONE SODIUM 1 G IJ SOLR
1.0000 g | Freq: Once | INTRAMUSCULAR | Status: AC
  Administered 2015-01-19: 1 g via INTRAVENOUS
  Filled 2015-01-19: qty 10

## 2015-01-19 NOTE — ED Provider Notes (Signed)
[facility] Emergency Department Provider Note  ____________________________________________  Time seen: On arrival  I have reviewed the triage vital signs and the nursing notes.   HISTORY  Chief Complaint Flank Pain    HPI Tammy Snyder is a 25 y.o. female with a history of interstitial cystitis, UTIs who presents with bilateral flank pain. She sees Dr. Alveria [facility]pleyBrandon [place] [facility] for IC. She denies fevers or chills. She reports her back started hurting yesterday night and made it very difficult for her to sleep. She has not had  nausea or vomiting. She does report dysuria which is common for her. No abdominal pain     Past Medical History  Diagnosis Date  . No pertinent past medical history   . Bipolar 1 disorder (HCC)   . Mental disorder     Borderline personalities disorder  . Vaginal Pap smear, abnormal   . Borderline personality disorder   . Oliguria 2013    cystoscopy, hydrodilation under anesthesia    Patient Active Problem List   Diagnosis Date Noted  . Abnormal uterine bleeding 03/16/2011  . Low-lying placenta 07/29/2010    Past Surgical History  Procedure Laterality Date  . Cochlear implant    . Cesarean section  10/29/2010    Procedure: CESAREAN SECTION;  Surgeon: Lazaro ArmsLuther H Eure, MD;  Location: [facility];  Service: Gynecology;;  . Cochlear implant    . Cystoscopy, hydrodilation under anesthesia  04/04/2011  . Calibration of urethra with dilation of the urethra under anesthesia  04/04/2011    Dr. Rosezetta SchlatterJohn Harman    Current Outpatient Rx  Name  Route  Sig  Dispense  Refill  . ciprofloxacin (CIPRO) 500 MG tablet   Oral   Take 1 tablet (500 mg total) by mouth 2 (two) times daily.   6 tablet   0   . HYDROmorphone (DILAUDID) 4 MG tablet   Oral   Take 4 mg by mouth every 4 (four) hours as needed.           . Meth-Hyo-M Bl-Na Phos-Ph Sal (URIBEL) 118 MG CAPS   Oral   Take 1 capsule (118 mg total) by mouth 4 (four) times daily as  needed (dysuria/ pelvic pain).   120 capsule   3   . Prenat w/o A-FE-DSS-Methfol-FA (PRENATAL MULTIVITAMIN) 90-600-400 MG-MCG-MCG tablet   Oral   Take 1 tablet by mouth daily.           . solifenacin (VESICARE) 5 MG tablet   Oral   Take 1 tablet (5 mg total) by mouth daily.   30 tablet   6     Allergies Latex; Nsaids; and Tylenol  Family History  Problem Relation Age of Onset  . Hypertension Father   . Stroke Father     Social History Social History  Substance Use Topics  . Smoking status: Current Some Day Smoker -- 1.00 packs/day for 8 years    Types: Cigarettes  . Smokeless tobacco: Never Used  . Alcohol Use: 0.0 oz/week    0 Standard drinks or equivalent per week    Review of Systems  Constitutional: Negative for fever or chills Eyes: Negative for discharge ENT: Negative for sore throat Cardiovascular: Negative for cough Respiratory: Negative for shortness of breath. Gastrointestinal: Negative for abdominal pain, vomiting Genitourinary: Positive for dysuria. Musculoskeletal: As above Skin: Negative for rash. Neurological: Negative for headaches  Psychiatric: Positive for anxiety    ____________________________________________   PHYSICAL EXAM:  VITAL SIGNS: ED Triage Vitals  Enc  Vitals Group     BP 01/19/15 1017 122/91 mmHg     Pulse Rate 01/19/15 1017 84     Resp 01/19/15 1017 18     Temp 01/19/15 1017 98.7 F (37.1 C)     Temp Source 01/19/15 1017 Oral     SpO2 01/19/15 1017 100 %     Weight 01/19/15 1017 123 lb (55.792 kg)     Height 01/19/15 1017 5' (1.524 m)     Head Cir --      Peak Flow --      Pain Score 01/19/15 1017 8     Pain Loc --      Pain Edu? --      Excl. in GC? --      Constitutional: Alert and oriented. Well appearing and in no distress. Eyes: Conjunctivae are normal.  ENT   Head: Normocephalic and atraumatic.   Mouth/Throat: Mucous membranes are moist. Cardiovascular: Normal rate, regular rhythm. Normal  and symmetric distal pulses are present in all extremities. No murmurs, rubs, or gallops. Respiratory: Normal respiratory effort without tachypnea nor retractions. Breath sounds are clear and equal bilaterally.  Gastrointestinal: Soft and non-tender in all quadrants. No distention. Mild CVA tenderness bilaterally Genitourinary: deferred Musculoskeletal: Nontender with normal range of motion in all extremities. No lower extremity tenderness nor edema. Neurologic:  Normal speech and language. No gross focal neurologic deficits are appreciated. Skin:  Skin is warm, dry and intact. No rash noted. Psychiatric: Mood and affect are normal. Patient exhibits appropriate insight and judgment.  ____________________________________________    LABS (pertinent positives/negatives)  Labs Reviewed  URINALYSIS COMPLETEWITH MICROSCOPIC ([facility] ONLY) - Abnormal; Notable for the following:    Color, Urine YELLOW (*)    APPearance CLOUDY (*)    Hgb urine dipstick 1+ (*)    pH 9.0 (*)    Protein, ur 100 (*)    Leukocytes, UA 2+ (*)    Bacteria, UA MANY (*)    Squamous Epithelial / LPF 0-5 (*)    All other components within normal limits  CBC - Abnormal; Notable for the following:    Platelets 140 (*)    All other components within normal limits  COMPREHENSIVE METABOLIC PANEL - Abnormal; Notable for the following:    Glucose, Bld 112 (*)    Alkaline Phosphatase 129 (*)    All other components within normal limits  URINE CULTURE  LIPASE, BLOOD  POCT PREGNANCY, URINE    ____________________________________________   EKG  None  ____________________________________________    RADIOLOGY I have personally reviewed any xrays that were ordered on this patient: None  ____________________________________________   PROCEDURES  Procedure(s) performed: none  Critical Care performed: none  ____________________________________________   INITIAL IMPRESSION / ASSESSMENT AND PLAN / ED  COURSE  Pertinent labs & imaging results that were available during my care of the patient were reviewed by me and considered in my medical decision making (see chart for details).  Patient presents with long history of ICD and urinary tract infections. Urinalysis is consistent with UTI. Back pain suggest possible pyelonephritis although vitals normal and labs unremarkable. We'll treat with IV pain medication, nausea medication and antibiotics  ----------------------------------------- 2:02 PM on 01/19/2015 -----------------------------------------  After treatment patient reports she is clearly pain free. All vitals are stable. I'll discharge her with 10 days of by mouth Cipro, anti-emetics and pain medication  ____________________________________________   FINAL CLINICAL IMPRESSION(S) / ED DIAGNOSES  Final diagnoses:  Pyelonephritis     Jene Every,  MD 01/19/15 1402

## 2015-01-19 NOTE — ED Notes (Signed)
Pt to triage with bilateral flank. Pt with chronic cystitis and seen by [place] [facility].

## 2015-01-19 NOTE — Discharge Instructions (Signed)

## 2015-01-21 LAB — URINE CULTURE: Culture: >=100000

## 2015-07-24 ENCOUNTER — Emergency Department (HOSPITAL_COMMUNITY)
Admission: EM | Admit: 2015-07-24 | Discharge: 2015-07-25 | Disposition: A | Discharge status: Home / Self Care | Payer: Medicaid Other | Attending: Emergency Medicine | Admitting: Emergency Medicine

## 2015-07-24 ENCOUNTER — Encounter (HOSPITAL_COMMUNITY): Payer: Self-pay | Admitting: *Deleted

## 2015-07-24 DIAGNOSIS — R1110 Vomiting, unspecified: Secondary | ICD-10-CM | POA: Insufficient documentation

## 2015-07-24 DIAGNOSIS — F17210 Nicotine dependence, cigarettes, uncomplicated: Secondary | ICD-10-CM | POA: Insufficient documentation

## 2015-07-24 DIAGNOSIS — N939 Abnormal uterine and vaginal bleeding, unspecified: Secondary | ICD-10-CM | POA: Diagnosis not present

## 2015-07-24 DIAGNOSIS — F319 Bipolar disorder, unspecified: Secondary | ICD-10-CM | POA: Insufficient documentation

## 2015-07-24 DIAGNOSIS — Z79899 Other long term (current) drug therapy: Secondary | ICD-10-CM | POA: Diagnosis not present

## 2015-07-24 DIAGNOSIS — R1030 Lower abdominal pain, unspecified: Secondary | ICD-10-CM | POA: Diagnosis present

## 2015-07-24 DIAGNOSIS — T7421XA Adult sexual abuse, confirmed, initial encounter: Secondary | ICD-10-CM | POA: Insufficient documentation

## 2015-07-24 DIAGNOSIS — Z206 Contact with and (suspected) exposure to human immunodeficiency virus [HIV]: Secondary | ICD-10-CM | POA: Diagnosis not present
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
## 2015-07-25 LAB — PREGNANCY, URINE: PREG TEST UR: NEGATIVE

## 2015-07-25 LAB — WET PREP, GENITAL
Clue Cells Wet Prep HPF POC: NONE SEEN
SPERM: NONE SEEN
Trich, Wet Prep: NONE SEEN
Yeast Wet Prep HPF POC: NONE SEEN

## 2015-07-25 LAB — URINALYSIS, ROUTINE W REFLEX MICROSCOPIC
BILIRUBIN URINE: NEGATIVE
GLUCOSE, UA: NEGATIVE mg/dL
HGB URINE DIPSTICK: NEGATIVE
Ketones, ur: NEGATIVE mg/dL
Nitrite: NEGATIVE
PH: 6 (ref 5.0–8.0)
Protein, ur: NEGATIVE mg/dL
SPECIFIC GRAVITY, URINE: 1.025 (ref 1.005–1.030)

## 2015-07-25 LAB — URINE MICROSCOPIC-ADD ON

## 2015-07-25 MED ORDER — CEFTRIAXONE SODIUM 250 MG IJ SOLR
250.0000 mg | Freq: Once | INTRAMUSCULAR | Status: AC
  Administered 2015-07-25: 250 mg via INTRAMUSCULAR
  Filled 2015-07-25: qty 250

## 2015-07-25 MED ORDER — LORAZEPAM 1 MG PO TABS
1.0000 mg | ORAL_TABLET | Freq: Once | ORAL | Status: AC
  Administered 2015-07-25: 1 mg via ORAL
  Filled 2015-07-25: qty 1

## 2015-07-25 MED ORDER — AZITHROMYCIN 1 G PO PACK
1.0000 g | PACK | Freq: Once | ORAL | Status: AC
  Administered 2015-07-25: 1 g via ORAL
  Filled 2015-07-25: qty 1

## 2015-07-25 MED ORDER — LIDOCAINE HCL (PF) 1 % IJ SOLN
INTRAMUSCULAR | Status: AC
  Administered 2015-07-25: 2.1 mL
  Filled 2015-07-25: qty 5
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
## 2015-07-26 LAB — HIV ANTIBODY (ROUTINE TESTING W REFLEX): HIV SCREEN 4TH GENERATION: NONREACTIVE

## 2015-07-26 LAB — RPR: RPR: NONREACTIVE

## 2015-07-27 LAB — GC/CHLAMYDIA PROBE AMP (~~LOC~~) NOT AT ARMC
Chlamydia: POSITIVE — AB
Neisseria Gonorrhea: NEGATIVE

## 2015-07-28 ENCOUNTER — Telehealth: Payer: Self-pay | Admitting: Emergency Medicine

## 2015-07-28 ENCOUNTER — Telehealth (HOSPITAL_BASED_OUTPATIENT_CLINIC_OR_DEPARTMENT_OTHER): Payer: Self-pay | Admitting: Emergency Medicine

## 2015-08-27 ENCOUNTER — Encounter (HOSPITAL_COMMUNITY): Payer: Self-pay | Admitting: Emergency Medicine

## 2015-08-27 ENCOUNTER — Emergency Department (HOSPITAL_COMMUNITY): Payer: Medicaid Other

## 2015-08-27 ENCOUNTER — Emergency Department (HOSPITAL_COMMUNITY)
Admission: EM | Admit: 2015-08-27 | Discharge: 2015-08-28 | Disposition: A | Discharge status: Home / Self Care | Payer: Medicaid Other | Attending: Emergency Medicine | Admitting: Emergency Medicine

## 2015-08-27 DIAGNOSIS — R109 Unspecified abdominal pain: Secondary | ICD-10-CM | POA: Diagnosis present

## 2015-08-27 DIAGNOSIS — Z79899 Other long term (current) drug therapy: Secondary | ICD-10-CM | POA: Insufficient documentation

## 2015-08-27 DIAGNOSIS — N3090 Cystitis, unspecified without hematuria: Secondary | ICD-10-CM | POA: Diagnosis not present

## 2015-08-27 DIAGNOSIS — F17210 Nicotine dependence, cigarettes, uncomplicated: Secondary | ICD-10-CM | POA: Insufficient documentation

## 2015-08-27 HISTORY — DX: Interstitial cystitis (chronic) without hematuria: N30.10

## 2015-08-27 LAB — URINALYSIS, ROUTINE W REFLEX MICROSCOPIC
BILIRUBIN URINE: NEGATIVE
GLUCOSE, UA: NEGATIVE mg/dL
KETONES UR: NEGATIVE mg/dL
NITRITE: NEGATIVE
PH: 6 (ref 5.0–8.0)
Specific Gravity, Urine: 1.01 (ref 1.005–1.030)

## 2015-08-27 LAB — URINE MICROSCOPIC-ADD ON

## 2015-08-27 LAB — CBC
HCT: 40.9 % (ref 36.0–46.0)
Hemoglobin: 14 g/dL (ref 12.0–15.0)
MCH: 32.6 pg (ref 26.0–34.0)
MCHC: 34.2 g/dL (ref 30.0–36.0)
MCV: 95.3 fL (ref 78.0–100.0)
PLATELETS: 161 10*3/uL (ref 150–400)
RBC: 4.29 MIL/uL (ref 3.87–5.11)
RDW: 12.7 % (ref 11.5–15.5)
WBC: 12.8 10*3/uL — AB (ref 4.0–10.5)

## 2015-08-27 LAB — BASIC METABOLIC PANEL
Anion gap: 7 (ref 5–15)
BUN: 6 mg/dL (ref 6–20)
CALCIUM: 8.5 mg/dL — AB (ref 8.9–10.3)
CHLORIDE: 106 mmol/L (ref 101–111)
CO2: 25 mmol/L (ref 22–32)
CREATININE: 0.62 mg/dL (ref 0.44–1.00)
GFR calc Af Amer: >60 mL/min (ref >60)
GFR calc non Af Amer: >60 mL/min (ref >60)
Glucose, Bld: 101 mg/dL — ABNORMAL HIGH (ref 65–99)
Potassium: 3.3 mmol/L — ABNORMAL LOW (ref 3.5–5.1)
SODIUM: 138 mmol/L (ref 135–145)

## 2015-08-27 LAB — PREGNANCY, URINE: Preg Test, Ur: NEGATIVE

## 2015-08-27 MED ORDER — HYDROMORPHONE HCL 1 MG/ML IJ SOLN
1.0000 mg | Freq: Once | INTRAMUSCULAR | Status: AC
  Administered 2015-08-27: 1 mg via INTRAVENOUS
  Filled 2015-08-27: qty 1

## 2015-08-27 MED ORDER — ONDANSETRON HCL 4 MG/2ML IJ SOLN
4.0000 mg | Freq: Once | INTRAMUSCULAR | Status: AC
  Administered 2015-08-27: 4 mg via INTRAVENOUS
  Filled 2015-08-27: qty 2

## 2015-08-27 MED ORDER — CEFTRIAXONE SODIUM 1 G IJ SOLR
1.0000 g | Freq: Once | INTRAMUSCULAR | Status: AC
  Administered 2015-08-27: 1 g via INTRAVENOUS
  Filled 2015-08-27: qty 10

## 2015-08-27 MED ORDER — SODIUM CHLORIDE 0.9 % IV BOLUS (SEPSIS)
1000.0000 mL | Freq: Once | INTRAVENOUS | Status: AC
  Administered 2015-08-27: 1000 mL via INTRAVENOUS

## 2015-08-27 MED ORDER — IOPAMIDOL (ISOVUE-300) INJECTION 61%
100.0000 mL | Freq: Once | INTRAVENOUS | Status: AC | PRN
  Administered 2015-08-27: 100 mL via INTRAVENOUS

## 2015-08-27 MED ORDER — DIATRIZOATE MEGLUMINE & SODIUM 66-10 % PO SOLN
ORAL | Status: AC
  Filled 2015-08-27: qty 30

## 2015-08-27 MED ORDER — FENTANYL CITRATE (PF) 100 MCG/2ML IJ SOLN
100.0000 ug | Freq: Once | INTRAMUSCULAR | Status: AC
  Administered 2015-08-27: 100 ug via INTRAVENOUS
  Filled 2015-08-27: qty 2

## 2015-08-27 NOTE — ED Triage Notes (Signed)
Patient complaining of bilateral flank pain starting 3 days ago. States she has history of cystitis.

## 2015-08-28 MED ORDER — TRAMADOL HCL 50 MG PO TABS
50.0000 mg | ORAL_TABLET | Freq: Once | ORAL | Status: AC
Start: 2015-08-28 — End: 2015-08-28
  Administered 2015-08-28: 50 mg via ORAL
  Filled 2015-08-28: qty 1

## 2015-08-28 MED ORDER — TRAMADOL HCL 50 MG PO TABS: 50.0000 mg | ORAL_TABLET | Freq: Four times a day (QID) | ORAL | 0 refills | Status: DC | PRN

## 2015-08-28 MED ORDER — CEPHALEXIN 500 MG PO CAPS: 500.0000 mg | ORAL_CAPSULE | Freq: Four times a day (QID) | ORAL | 0 refills | Status: DC

## 2015-08-28 NOTE — ED Provider Notes (Signed)
The pt was signed out to me at the change of shift - CT followed up - no signs of pyelo - pt is crying and requesting d/c - does not appear septic, home with meds as below.  .  Meds given in ED:  Medications  sodium chloride 0.9 % bolus 1,000 mL (0 mLs Intravenous Stopped 08/27/15 2346)  HYDROmorphone (DILAUDID) injection 1 mg (1 mg Intravenous Given 08/27/15 2112)  ondansetron (ZOFRAN) injection 4 mg (4 mg Intravenous Given 08/27/15 2111)  cefTRIAXone (ROCEPHIN) 1 g in dextrose 5 % 50 mL IVPB (0 g Intravenous Stopped 08/27/15 2346)  fentaNYL (SUBLIMAZE) injection 100 mcg (100 mcg Intravenous Given 08/27/15 2216)  iopamidol (ISOVUE-300) 61 % injection 100 mL (100 mLs Intravenous Contrast Given 08/27/15 2315)  traMADol (ULTRAM) tablet 50 mg (50 mg Oral Given 08/28/15 0009)    Discharge Medication List as of 08/28/2015 12:01 AM    START taking these medications   Details  cephALEXin (KEFLEX) 500 MG capsule Take 1 capsule (500 mg total) by mouth 4 (four) times daily., Starting Thu 08/27/2015, Print    traMADol (ULTRAM) 50 MG tablet Take 1 tablet (50 mg total) by mouth every 6 (six) hours as needed., Starting Fri 08/28/2015, Print          Eber HongBrian Hadriel Northup, MD 08/28/15 1323

## 2015-08-28 NOTE — Discharge Instructions (Signed)
CT scan shows that you have bladder inflammation consistent with a bladder infection Keflex every 6 hours for 10 days Ultram for severe pain, Tylenol for mild pain Return to the ER as needed for high fevers, vomiting or worsening pain  Please obtain all of your results from medical records or have your doctors office obtain the results - share them with your doctor - you should be seen at your doctors office in the next 2 days. Call today to arrange your follow up. Take the medications as prescribed. Please review all of the medicines and only take them if you do not have an allergy to them. Please be aware that if you are taking birth control pills, taking other prescriptions, ESPECIALLY ANTIBIOTICS may make the birth control ineffective - if this is the case, either do not engage in sexual activity or use alternative methods of birth control such as condoms until you have finished the medicine and your family doctor says it is OK to restart them. If you are on a blood thinner such as COUMADIN, be aware that any other medicine that you take may cause the coumadin to either work too much, or not enough - you should have your coumadin level rechecked in next 7 days if this is the case.  ?  It is also a possibility that you have an allergic reaction to any of the medicines that you have been prescribed - Everybody reacts differently to medications and while MOST people have no trouble with most medicines, you may have a reaction such as nausea, vomiting, rash, swelling, shortness of breath. If this is the case, please stop taking the medicine immediately and contact your physician.  ?  You should return to the ER if you develop severe or worsening symptoms.

## 2015-09-01 ENCOUNTER — Encounter (HOSPITAL_COMMUNITY): Payer: Self-pay | Admitting: *Deleted

## 2015-09-01 ENCOUNTER — Emergency Department (HOSPITAL_COMMUNITY)
Admission: EM | Admit: 2015-09-01 | Discharge: 2015-09-01 | Disposition: A | Discharge status: Home / Self Care | Payer: Medicaid Other | Attending: Emergency Medicine | Admitting: Emergency Medicine

## 2015-09-01 DIAGNOSIS — Z79899 Other long term (current) drug therapy: Secondary | ICD-10-CM | POA: Insufficient documentation

## 2015-09-01 DIAGNOSIS — F17210 Nicotine dependence, cigarettes, uncomplicated: Secondary | ICD-10-CM | POA: Diagnosis not present

## 2015-09-01 DIAGNOSIS — F1410 Cocaine abuse, uncomplicated: Secondary | ICD-10-CM | POA: Insufficient documentation

## 2015-09-01 DIAGNOSIS — F1110 Opioid abuse, uncomplicated: Secondary | ICD-10-CM | POA: Insufficient documentation

## 2015-09-01 DIAGNOSIS — F1910 Other psychoactive substance abuse, uncomplicated: Secondary | ICD-10-CM

## 2015-09-01 DIAGNOSIS — R4182 Altered mental status, unspecified: Secondary | ICD-10-CM | POA: Diagnosis not present

## 2015-09-01 LAB — CBC WITH DIFFERENTIAL/PLATELET
BASOS PCT: 0 %
Basophils Absolute: 0 10*3/uL (ref 0.0–0.1)
EOS PCT: 1 %
Eosinophils Absolute: 0 10*3/uL (ref 0.0–0.7)
HEMATOCRIT: 41.8 % (ref 36.0–46.0)
Hemoglobin: 14.4 g/dL (ref 12.0–15.0)
Lymphocytes Relative: 47 %
Lymphs Abs: 2.8 10*3/uL (ref 0.7–4.0)
MCH: 32.6 pg (ref 26.0–34.0)
MCHC: 34.4 g/dL (ref 30.0–36.0)
MCV: 94.6 fL (ref 78.0–100.0)
MONO ABS: 0.4 10*3/uL (ref 0.1–1.0)
MONOS PCT: 7 %
NEUTROS ABS: 2.8 10*3/uL (ref 1.7–7.7)
Neutrophils Relative %: 45 %
PLATELETS: 176 10*3/uL (ref 150–400)
RBC: 4.42 MIL/uL (ref 3.87–5.11)
RDW: 12.3 % (ref 11.5–15.5)
WBC: 6.1 10*3/uL (ref 4.0–10.5)

## 2015-09-01 LAB — RAPID URINE DRUG SCREEN, HOSP PERFORMED
Amphetamines: NOT DETECTED
BENZODIAZEPINES: NOT DETECTED
Barbiturates: NOT DETECTED
COCAINE: NOT DETECTED
OPIATES: NOT DETECTED
Tetrahydrocannabinol: POSITIVE — AB

## 2015-09-01 LAB — COMPREHENSIVE METABOLIC PANEL
ALBUMIN: 4.3 g/dL (ref 3.5–5.0)
ALT: 14 U/L (ref 14–54)
ANION GAP: 8 (ref 5–15)
AST: 20 U/L (ref 15–41)
Alkaline Phosphatase: 73 U/L (ref 38–126)
BILIRUBIN TOTAL: 0.7 mg/dL (ref 0.3–1.2)
BUN: 9 mg/dL (ref 6–20)
CHLORIDE: 105 mmol/L (ref 101–111)
CO2: 24 mmol/L (ref 22–32)
Calcium: 9.2 mg/dL (ref 8.9–10.3)
Creatinine, Ser: 0.62 mg/dL (ref 0.44–1.00)
GFR calc Af Amer: >60 mL/min (ref >60)
Glucose, Bld: 91 mg/dL (ref 65–99)
POTASSIUM: 3.4 mmol/L — AB (ref 3.5–5.1)
Sodium: 137 mmol/L (ref 135–145)
TOTAL PROTEIN: 7.6 g/dL (ref 6.5–8.1)

## 2015-09-01 LAB — URINALYSIS, ROUTINE W REFLEX MICROSCOPIC
Bilirubin Urine: NEGATIVE
Glucose, UA: NEGATIVE mg/dL
KETONES UR: 15 mg/dL — AB
LEUKOCYTES UA: NEGATIVE
NITRITE: NEGATIVE
Specific Gravity, Urine: 1.02 (ref 1.005–1.030)
pH: 6 (ref 5.0–8.0)

## 2015-09-01 LAB — URINE MICROSCOPIC-ADD ON

## 2015-09-01 LAB — ETHANOL

## 2015-09-01 MED ORDER — ONDANSETRON 4 MG PO TBDP: ORAL_TABLET | ORAL | 0 refills | Status: DC

## 2015-09-01 MED ORDER — CLONIDINE HCL 0.1 MG PO TABS: ORAL_TABLET | ORAL | 0 refills | Status: DC

## 2015-09-01 MED ORDER — KETOROLAC TROMETHAMINE 30 MG/ML IJ SOLN
30.0000 mg | Freq: Once | INTRAMUSCULAR | Status: AC
  Administered 2015-09-01: 30 mg via INTRAVENOUS
  Filled 2015-09-01: qty 1

## 2015-09-01 MED ORDER — SODIUM CHLORIDE 0.9 % IV BOLUS (SEPSIS)
1000.0000 mL | Freq: Once | INTRAVENOUS | Status: AC
  Administered 2015-09-01: 1000 mL via INTRAVENOUS

## 2015-09-01 NOTE — ED Triage Notes (Signed)
Pt comes in with requests to come off of cocaine, "pills," and alcohol. Her last use was yesterday. Pt states that she has abdominal pain accompanied by vomiting. Pt denies any SI/HI.

## 2015-09-01 NOTE — Discharge Instructions (Signed)
Follow up with [facility] or another substance abuse recovery place

## 2015-09-01 NOTE — ED Provider Notes (Signed)
[facility]-EMERG[facility]Y DEPT Provider Note   CSN: [id number] Arrival date & time: 09/01/15  1223   By signing my name below, I, Tammy Snyder, attest that this documentation has been prepared under the direction and in the presence of Bethann BerkshireJoseph Dayanna Pryce, MD . Electronically Signed: Christel MormonMatthew Snyder, Scribe. 09/01/2015. 1:30 PM.   History   Chief Complaint Chief Complaint  Patient presents with  . Detox    The pt wants help with substance abuse   The history is provided by the patient. No language interpreter was used.  Altered Mental Status   This is a new problem. The problem has not changed since onset.Pertinent negatives include no seizures, no hallucinations and no self-injury. Risk factors include illicit drug use. Her past medical history is significant for depression (bipolar disorder).     HPI Comments:  Tammy Snyder is a 25 y.o. female who presents to the Emergency Department requesting a detox.  Pt has been abusing pills, cocaine, and heroin. Pt denies suicidal ideation.  Pt states that she has been diagnosed with kidney infection and is taking Keflex after being seen in ED on 08/27/15. Pt notes associated suprapubic abdominal pain with no alleviating factors.     Past Medical History:  Diagnosis Date  . Bipolar 1 disorder (HCC)   . Borderline personality disorder   . Interstitial cystitis   . Mental disorder    Borderline personalities disorder  . No pertinent past medical history   . Oliguria 2013   cystoscopy, hydrodilation under anesthesia  . Vaginal Pap smear, abnormal     Patient Active Problem List   Diagnosis Date Noted  . Abnormal uterine bleeding 03/16/2011  . Low-lying placenta 07/29/2010    Past Surgical History:  Procedure Laterality Date  . Calibration of urethra with dilation of the urethra under anesthesia  04/04/2011   Dr. Rosezetta SchlatterJohn Harman  . CESAREAN SECTION  10/29/2010   Procedure: CESAREAN SECTION;  Surgeon: Lazaro ArmsLuther H Eure, MD;  Location: [facility];   Service: Gynecology;;  . COCHLEAR IMPLANT    . COCHLEAR IMPLANT    . cystoscopy, hydrodilation under anesthesia  04/04/2011    OB History    Gravida Para Term Preterm AB Living   2 2 1 1  0 1   SAB TAB Ectopic Multiple Live Births   0 0 0 0 1       Home Medications    Prior to Admission medications   Medication Sig Start Date End Date Taking? Authorizing Provider  AZO-CRANBERRY PO Take 1 tablet by mouth 3 (three) times daily as needed (pain).    Historical Provider, MD  cephALEXin (KEFLEX) 500 MG capsule Take 1 capsule (500 mg total) by mouth 4 (four) times daily. 08/27/15   Eber HongBrian Miller, MD  SPRINTEC 28 0.25-35 MG-MCG tablet Take 1 tablet by mouth daily. 07/29/15   Historical Provider, MD  traMADol (ULTRAM) 50 MG tablet Take 1 tablet (50 mg total) by mouth every 6 (six) hours as needed. 08/28/15   Eber HongBrian Miller, MD    Family History Family History  Problem Relation Age of Onset  . Hypertension Father   . Stroke Father     Social History Social History  Substance Use Topics  . Smoking status: Current Some Day Smoker    Packs/day: 1.00    Years: 8.00    Types: Cigarettes  . Smokeless tobacco: Never Used  . Alcohol use 0.0 oz/week     Allergies   Latex; Nsaids; and Tylenol [acetaminophen]   Review of  Systems Review of Systems  Constitutional: Negative for appetite change and fatigue.  HENT: Negative for congestion, ear discharge and sinus pressure.   Eyes: Negative for discharge.  Respiratory: Negative for cough.   Cardiovascular: Negative for chest pain.  Musculoskeletal: Negative for back pain.  Skin: Negative for rash.  Neurological: Negative for seizures.  Psychiatric/Behavioral: Negative for hallucinations, self-injury and suicidal ideas.       Requesting detox     Physical Exam Updated Vital Signs BP 107/76 (BP Location: Left Arm)   Pulse 83   Temp 98.1 F (36.7 C) (Oral)   Resp 17   Ht 5\' 1"  (1.549 m)   Wt 116 lb (52.6 kg)   LMP 08/10/2015    SpO2 100%   BMI 21.92 kg/m   Physical Exam  Constitutional: She is oriented to person, place, and time. She appears well-developed.  HENT:  Head: Normocephalic.  Eyes: Conjunctivae and EOM are normal. No scleral icterus.  Neck: Neck supple. No thyromegaly present.  Cardiovascular: Normal rate and regular rhythm.  Exam reveals no gallop and no friction rub.   No murmur heard. Pulmonary/Chest: No stridor. She has no wheezes. She has no rales. She exhibits no tenderness.  Abdominal: She exhibits no distension. There is tenderness. There is no rebound.  Mild tenderness to suprapubic  Musculoskeletal: Normal range of motion. She exhibits no edema.  Lymphadenopathy:    She has no cervical adenopathy.  Neurological: She is oriented to person, place, and time. She exhibits normal muscle tone. Coordination normal.  Skin: No rash noted. No erythema.  Psychiatric:  Depressed No SI/HI  Nursing note and vitals reviewed.    ED Treatments / Results  DIAGNOSTIC STUDIES:  Oxygen Saturation is 100% on room air, normal by my interpretation.    COORDINATION OF CARE:  1:03 PM Will order blood work.  Discussed treatment plan with pt at bedside and pt agreed to plan.  Labs (all labs ordered are listed, but only abnormal results are displayed) Labs Reviewed  CBC WITH DIFFERENTIAL/PLATELET  COMPREHENSIVE METABOLIC PANEL  ETHANOL  URINALYSIS, ROUTINE W REFLEX MICROSCOPIC (NOT AT [facility])  URINE RAPID DRUG SCREEN, HOSP PERFORMED    EKG  EKG Interpretation None       Radiology No results found.  Procedures Procedures (including critical care time)  Medications Ordered in ED Medications  sodium chloride 0.9 % bolus 1,000 mL (not administered)     Initial Impression / Assessment and Plan / ED Course  I have reviewed the triage vital signs and the nursing notes.  Pertinent labs & imaging results that were available during my care of the patient were reviewed by me and considered in  my medical decision making (see chart for details).  Clinical Course    The  Pt is not suicidal or homicidal.  She is given referrals to out pt tx and also clonidine and zofran  For withdrawal symptoms  Final Clinical Impressions(s) / ED Diagnoses   Final diagnoses:  None    New Prescriptions New Prescriptions   No medications on file   The chart was scribed for me under my direct supervision.  I personally performed the history, physical, and medical decision making and all procedures in the evaluation of this patient.Bethann Berkshire.     Quita Mcgrory, MD 09/01/15 [phone]

## 2015-09-01 NOTE — ED Notes (Signed)
Pt denies si/hi/hallucinations or hearing voices upon d/c

## 2015-09-11 NOTE — ED Provider Notes (Signed)
[facility]-EMER[facility]Y DEPT Provider Note   CSN: [id number] Arrival date & time: 08/27/15  1905     History   Chief Complaint Chief Complaint  Patient presents with  . Flank Pain    HPI Tammy Snyder is a 25 y.o. female.  HPI Patient presents with bilateral flank pain and dysuria. Has previous history of interstitial cystitis. No fever or chills. States she's had pyelonephritis in the past and that her symptoms are similar. Past Medical History:  Diagnosis Date  . Bipolar 1 disorder (HCC)   . Borderline personality disorder   . Interstitial cystitis   . Mental disorder    Borderline personalities disorder  . No pertinent past medical history   . Oliguria 2013   cystoscopy, hydrodilation under anesthesia  . Vaginal Pap smear, abnormal     Patient Active Problem List   Diagnosis Date Noted  . Abnormal uterine bleeding 03/16/2011  . Low-lying placenta 07/29/2010    Past Surgical History:  Procedure Laterality Date  . Calibration of urethra with dilation of the urethra under anesthesia  04/04/2011   Dr. Rosezetta Schlatter  . CESAREAN SECTION  10/29/2010   Procedure: CESAREAN SECTION;  Surgeon: Lazaro Arms, MD;  Location: [facility];  Service: Gynecology;;  . COCHLEAR IMPLANT    . COCHLEAR IMPLANT    . cystoscopy, hydrodilation under anesthesia  04/04/2011    OB History    Gravida Para Term Preterm AB Living   2 2 1 1  0 1   SAB TAB Ectopic Multiple Live Births   0 0 0 0 1       Home Medications    Prior to Admission medications   Medication Sig Start Date End Date Taking? Authorizing Provider  SPRINTEC 28 0.25-35 MG-MCG tablet Take 1 tablet by mouth daily. 07/29/15  Yes Historical Provider, MD  cephALEXin (KEFLEX) 500 MG capsule Take 1 capsule (500 mg total) by mouth 4 (four) times daily. 08/27/15   Eber Hong, MD  cloNIDine (CATAPRES) 0.1 MG tablet Take one every 8 hours as needed for withdrawal symptoms 09/01/15   Bethann Berkshire, MD  ondansetron (ZOFRAN ODT) 4 MG  disintegrating tablet 4mg  ODT q4 hours prn nausea/vomit 09/01/15   Bethann Berkshire, MD  traMADol (ULTRAM) 50 MG tablet Take 1 tablet (50 mg total) by mouth every 6 (six) hours as needed. 08/28/15   Eber Hong, MD    Family History Family History  Problem Relation Age of Onset  . Hypertension Father   . Stroke Father     Social History Social History  Substance Use Topics  . Smoking status: Current Some Day Smoker    Packs/day: 1.00    Years: 8.00    Types: Cigarettes  . Smokeless tobacco: Never Used  . Alcohol use 0.0 oz/week     Allergies   Latex; Nsaids; and Tylenol [acetaminophen]   Review of Systems Review of Systems  Constitutional: Negative for chills and fever.  Gastrointestinal: Positive for abdominal pain. Negative for nausea and vomiting.  Genitourinary: Positive for dysuria, flank pain and hematuria. Negative for difficulty urinating, pelvic pain, vaginal bleeding and vaginal discharge.  Musculoskeletal: Positive for back pain and myalgias. Negative for neck stiffness.  Skin: Negative for rash and wound.  Neurological: Negative for dizziness, weakness, light-headedness and numbness.  Psychiatric/Behavioral: Positive for agitation.  All other systems reviewed and are negative.    Physical Exam Updated Vital Signs BP 110/69 (BP Location: Right Arm)   Pulse 72   Temp 98.4 F (36.9 C) (  Oral)   Resp 18   Ht 5\' 4"  (1.626 m)   Wt 120 lb (54.4 kg)   LMP 08/10/2015   SpO2 97%   BMI 20.60 kg/m   Physical Exam  Constitutional: She is oriented to person, place, and time. She appears well-developed and well-nourished.  HENT:  Head: Normocephalic and atraumatic.  Mouth/Throat: Oropharynx is clear and moist.  Eyes: EOM are normal. Pupils are equal, round, and reactive to light.  Neck: Normal range of motion. Neck supple.  Cardiovascular: Normal rate and regular rhythm.  Exam reveals no gallop and no friction rub.   No murmur heard. Pulmonary/Chest: Effort  normal and breath sounds normal.  Abdominal: Soft. Bowel sounds are normal. There is tenderness (mild diffuse abdominal tenderness to palpation.). There is no rebound and no guarding.  Musculoskeletal: Normal range of motion. She exhibits no edema or tenderness.  Bilateral flank tenderness with percussion. Patient also has bilateral lumbar paraspinal tenderness to palpation.  Neurological: She is alert and oriented to person, place, and time.  Moves all extremities without deficit. Sensation is intact.  Skin: Skin is warm and dry. No rash noted. No erythema.  Psychiatric: She has a normal mood and affect. Her behavior is normal.  Nursing note and vitals reviewed.    ED Treatments / Results  Labs (all labs ordered are listed, but only abnormal results are displayed) Labs Reviewed  URINALYSIS, ROUTINE W REFLEX MICROSCOPIC (NOT AT [facility]) - Abnormal; Notable for the following:       Result Value   Hgb urine dipstick LARGE (*)    Protein, ur TRACE (*)    Leukocytes, UA LARGE (*)    All other components within normal limits  BASIC METABOLIC PANEL - Abnormal; Notable for the following:    Potassium 3.3 (*)    Glucose, Bld 101 (*)    Calcium 8.5 (*)    All other components within normal limits  CBC - Abnormal; Notable for the following:    WBC 12.8 (*)    All other components within normal limits  URINE MICROSCOPIC-ADD ON - Abnormal; Notable for the following:    Squamous Epithelial / LPF 6-30 (*)    Bacteria, UA MANY (*)    All other components within normal limits  PREGNANCY, URINE    EKG  EKG Interpretation None       Radiology No results found.  Procedures Procedures (including critical care time)  Medications Ordered in ED Medications  sodium chloride 0.9 % bolus 1,000 mL (0 mLs Intravenous Stopped 08/27/15 2346)  HYDROmorphone (DILAUDID) injection 1 mg (1 mg Intravenous Given 08/27/15 2112)  ondansetron (ZOFRAN) injection 4 mg (4 mg Intravenous Given 08/27/15 2111)    cefTRIAXone (ROCEPHIN) 1 g in dextrose 5 % 50 mL IVPB (0 g Intravenous Stopped 08/27/15 2346)  fentaNYL (SUBLIMAZE) injection 100 mcg (100 mcg Intravenous Given 08/27/15 2216)  iopamidol (ISOVUE-300) 61 % injection 100 mL (100 mLs Intravenous Contrast Given 08/27/15 2315)  traMADol (ULTRAM) tablet 50 mg (50 mg Oral Given 08/28/15 0009)     Initial Impression / Assessment and Plan / ED Course  I have reviewed the triage vital signs and the nursing notes.  Pertinent labs & imaging results that were available during my care of the patient were reviewed by me and considered in my medical decision making (see chart for details).  Clinical Course  Comment By Time  Antibiotics given Pain meds given Pt informed of all results Pt refuses to stay for more treatement and  requests d/c home. Meds for home rx  Eber HongBrian Miller, MD 08/11 0001    Signed out to oncoming emergency physician pending CT to rule out pyelonephritis.  Final Clinical Impressions(s) / ED Diagnoses   Final diagnoses:  Cystitis    New Prescriptions Discharge Medication List as of 08/28/2015 12:01 AM    START taking these medications   Details  cephALEXin (KEFLEX) 500 MG capsule Take 1 capsule (500 mg total) by mouth 4 (four) times daily., Starting Thu 08/27/2015, Print    traMADol (ULTRAM) 50 MG tablet Take 1 tablet (50 mg total) by mouth every 6 (six) hours as needed., Starting Fri 08/28/2015, Print         Loren Raceravid Christian Treadway, MD 09/11/15 [phone]

## 2015-11-14 ENCOUNTER — Encounter (HOSPITAL_COMMUNITY): Payer: Self-pay | Admitting: Emergency Medicine

## 2015-11-14 ENCOUNTER — Emergency Department (HOSPITAL_COMMUNITY)
Admission: EM | Admit: 2015-11-14 | Discharge: 2015-11-14 | Disposition: A | Discharge status: Home / Self Care | Payer: Medicaid Other | Attending: Emergency Medicine | Admitting: Emergency Medicine

## 2015-11-14 DIAGNOSIS — F17210 Nicotine dependence, cigarettes, uncomplicated: Secondary | ICD-10-CM | POA: Insufficient documentation

## 2015-11-14 DIAGNOSIS — Z79899 Other long term (current) drug therapy: Secondary | ICD-10-CM | POA: Diagnosis not present

## 2015-11-14 DIAGNOSIS — R110 Nausea: Secondary | ICD-10-CM | POA: Diagnosis not present

## 2015-11-14 HISTORY — DX: Other psychoactive substance abuse, uncomplicated: F19.10

## 2015-11-14 LAB — URINALYSIS, ROUTINE W REFLEX MICROSCOPIC
Bilirubin Urine: NEGATIVE
Glucose, UA: NEGATIVE mg/dL
HGB URINE DIPSTICK: NEGATIVE
KETONES UR: NEGATIVE mg/dL
LEUKOCYTES UA: NEGATIVE
Nitrite: NEGATIVE
PROTEIN: NEGATIVE mg/dL
Specific Gravity, Urine: 1.015 (ref 1.005–1.030)
pH: 7 (ref 5.0–8.0)

## 2015-11-14 LAB — POC URINE PREG, ED: PREG TEST UR: NEGATIVE

## 2015-11-14 NOTE — ED Provider Notes (Signed)
[facility] Provider Note   CSN: [id number] Arrival date & time: 11/14/15  1432     History   Chief Complaint Chief Complaint  Patient presents with  . Nausea    HPI Tammy Snyder is a 25 y.o. female.  HPI  Tammy Snyder is a 25 y.o. female who presents to the Emergency Department requesting a pregnancy test.  She reports having nausea for several days and a "missed" period.  She took a home pregnancy test hat was negative.  She denies abdominal pain, vomiting, dysuria, vaginal discharge, spotting or pelvic pain.     Past Medical History:  Diagnosis Date  . Bipolar 1 disorder (HCC)   . Borderline personality disorder   . Interstitial cystitis   . Mental disorder    Borderline personalities disorder  . Oliguria 2013   cystoscopy, hydrodilation under anesthesia  . Polysubstance abuse   . Vaginal Pap smear, abnormal     Patient Active Problem List   Diagnosis Date Noted  . Abnormal uterine bleeding 03/16/2011  . Low-lying placenta 07/29/2010    Past Surgical History:  Procedure Laterality Date  . Calibration of urethra with dilation of the urethra under anesthesia  04/04/2011   Dr. Rosezetta SchlatterJohn Harman  . CESAREAN SECTION  10/29/2010   Procedure: CESAREAN SECTION;  Surgeon: Lazaro ArmsLuther H Eure, MD;  Location: [facility];  Service: Gynecology;;  . COCHLEAR IMPLANT    . COCHLEAR IMPLANT    . cystoscopy, hydrodilation under anesthesia  04/04/2011    OB History    Gravida Para Term Preterm AB Living   2 2 1 1  0 1   SAB TAB Ectopic Multiple Live Births   0 0 0 0 1       Home Medications    Prior to Admission medications   Medication Sig Start Date End Date Taking? Authorizing Provider  cephALEXin (KEFLEX) 500 MG capsule Take 1 capsule (500 mg total) by mouth 4 (four) times daily. 08/27/15   Eber HongBrian Miller, MD  cloNIDine (CATAPRES) 0.1 MG tablet Take one every 8 hours as needed for withdrawal symptoms 09/01/15   Bethann BerkshireJoseph Zammit, MD  ondansetron (ZOFRAN ODT) 4 MG  disintegrating tablet 4mg  ODT q4 hours prn nausea/vomit 09/01/15   Bethann BerkshireJoseph Zammit, MD  SPRINTEC 28 0.25-35 MG-MCG tablet Take 1 tablet by mouth daily. 07/29/15   Historical Provider, MD  traMADol (ULTRAM) 50 MG tablet Take 1 tablet (50 mg total) by mouth every 6 (six) hours as needed. 08/28/15   Eber HongBrian Miller, MD    Family History Family History  Problem Relation Age of Onset  . Hypertension Father   . Stroke Father     Social History Social History  Substance Use Topics  . Smoking status: Current Some Day Smoker    Packs/day: 0.50    Years: 8.00    Types: Cigarettes  . Smokeless tobacco: Never Used  . Alcohol use 0.0 oz/week     Comment: occ     Allergies   Latex; Nsaids; and Tylenol [acetaminophen]   Review of Systems Review of Systems  Constitutional: Negative for chills and fever.  Respiratory: Negative for shortness of breath.   Cardiovascular: Negative for chest pain.  Gastrointestinal: Positive for nausea. Negative for abdominal pain and vomiting.  Genitourinary: Negative for difficulty urinating, dysuria, flank pain, hematuria, pelvic pain, urgency, vaginal bleeding, vaginal discharge and vaginal pain.  Musculoskeletal: Negative for arthralgias and myalgias.  Skin: Negative for rash.  Neurological: Negative for dizziness, weakness and numbness.  Physical Exam Updated Vital Signs BP 100/83 (BP Location: Left Arm)   Pulse 79   Temp 98 F (36.7 C) (Oral)   Resp 18   Ht 5' 0.5" (1.537 m)   Wt 55.3 kg   LMP 10/07/2015 Comment: missed pills starting in august  SpO2 100%   BMI 23.43 kg/m   Physical Exam  Constitutional: She is oriented to person, place, and time. She appears well-developed and well-nourished. No distress.  HENT:  Head: Atraumatic.  Mouth/Throat: Oropharynx is clear and moist.  Cardiovascular: Normal rate and regular rhythm.   Pulmonary/Chest: Effort normal. No respiratory distress.  Abdominal: Soft. She exhibits no distension and no mass.  There is no tenderness. There is no guarding.  Neurological: She is alert and oriented to person, place, and time.  Skin: Skin is warm. No rash noted.  Nursing note and vitals reviewed.    ED Treatments / Results  Labs (all labs ordered are listed, but only abnormal results are displayed) Labs Reviewed  URINALYSIS, ROUTINE W REFLEX MICROSCOPIC (NOT AT [facility]) - Abnormal; Notable for the following:       Result Value   APPearance CLOUDY (*)    All other components within normal limits  POC URINE PREG, ED    EKG  EKG Interpretation None       Radiology No results found.  Procedures Procedures (including critical care time)  Medications Ordered in ED Medications - No data to display   Initial Impression / Assessment and Plan / ED Course  I have reviewed the triage vital signs and the nursing notes.  Pertinent labs & imaging results that were available during my care of the patient were reviewed by me and considered in my medical decision making (see chart for details).  Clinical Course   Pt well appearing.  abd soft , NT. Symptomatic at present.  Urine pregnancy test negative.  Advised to arrange OB/GYN f/u  The patient appears reasonably screened and/or stabilized for discharge and I doubt any other medical condition or other [facility] requiring further screening, evaluation, or treatment in the ED at this time prior to discharge.   Final Clinical Impressions(s) / ED Diagnoses   Final diagnoses:  Nausea without vomiting    New Prescriptions Discharge Medication List as of 11/14/2015  3:35 PM       Oyuki Hogan Trisha Mangleriplett, PA-C 11/14/15 2204    Samuel JesterKathleen McManus, DO 11/15/15 [phone]

## 2015-11-14 NOTE — Discharge Instructions (Signed)
Your test today indicates that you are not pregnant.  You can follow-up with the OB/GYN clinic listed

## 2015-11-14 NOTE — ED Triage Notes (Signed)
PT states she thinks she is pregnant and requesting pregnancy test and states ept test at home said negative but pt states nausea the past week. LMP: 10/07/15. PT denies any vaginal discharge or urinary symptoms.

## 2015-12-31 ENCOUNTER — Encounter (HOSPITAL_COMMUNITY): Payer: Self-pay

## 2015-12-31 ENCOUNTER — Emergency Department (HOSPITAL_COMMUNITY)
Admission: EM | Admit: 2015-12-31 | Discharge: 2015-12-31 | Disposition: A | Discharge status: Home / Self Care | Payer: Medicaid Other | Attending: Emergency Medicine | Admitting: Emergency Medicine

## 2015-12-31 ENCOUNTER — Emergency Department (HOSPITAL_COMMUNITY): Payer: Medicaid Other

## 2015-12-31 DIAGNOSIS — Z91040 Latex allergy status: Secondary | ICD-10-CM | POA: Diagnosis not present

## 2015-12-31 DIAGNOSIS — S39012A Strain of muscle, fascia and tendon of lower back, initial encounter: Secondary | ICD-10-CM | POA: Diagnosis not present

## 2015-12-31 DIAGNOSIS — W010XXA Fall on same level from slipping, tripping and stumbling without subsequent striking against object, initial encounter: Secondary | ICD-10-CM | POA: Insufficient documentation

## 2015-12-31 DIAGNOSIS — S199XXA Unspecified injury of neck, initial encounter: Secondary | ICD-10-CM | POA: Diagnosis present

## 2015-12-31 DIAGNOSIS — S161XXA Strain of muscle, fascia and tendon at neck level, initial encounter: Secondary | ICD-10-CM

## 2015-12-31 DIAGNOSIS — F17210 Nicotine dependence, cigarettes, uncomplicated: Secondary | ICD-10-CM | POA: Insufficient documentation

## 2015-12-31 DIAGNOSIS — Y939 Activity, unspecified: Secondary | ICD-10-CM | POA: Insufficient documentation

## 2015-12-31 DIAGNOSIS — Y999 Unspecified external cause status: Secondary | ICD-10-CM | POA: Diagnosis not present

## 2015-12-31 DIAGNOSIS — Y929 Unspecified place or not applicable: Secondary | ICD-10-CM | POA: Insufficient documentation

## 2015-12-31 DIAGNOSIS — W19XXXA Unspecified fall, initial encounter: Secondary | ICD-10-CM

## 2015-12-31 LAB — POC URINE PREG, ED: PREG TEST UR: NEGATIVE

## 2015-12-31 MED ORDER — METHOCARBAMOL 500 MG PO TABS: 1000.0000 mg | ORAL_TABLET | Freq: Four times a day (QID) | ORAL | 0 refills | Status: AC

## 2015-12-31 MED ORDER — METHOCARBAMOL 500 MG PO TABS
500.0000 mg | ORAL_TABLET | Freq: Once | ORAL | Status: AC
  Administered 2015-12-31: 500 mg via ORAL
  Filled 2015-12-31: qty 1

## 2015-12-31 MED ORDER — TRAMADOL HCL 50 MG PO TABS: 50.0000 mg | ORAL_TABLET | Freq: Four times a day (QID) | ORAL | 0 refills | Status: DC | PRN

## 2015-12-31 NOTE — Discharge Instructions (Signed)
Do not drive within 4 hours of taking tramadol or robaxin as these can make you drowsy.  Avoid lifting,  Bending,  Twisting or any other activity that worsens your pain over the next week.  Apply an  icepack  to your neck, shoulder and lower back for 10-15 minutes every 2 hours for the next 2 days.  You should get rechecked if your symptoms are not better over the next 10 days.

## 2015-12-31 NOTE — ED Triage Notes (Signed)
Pt reports she slipped getting out of the shower and fell this morning.  C/O pain to left shoulder, neck, and back.

## 2015-12-31 NOTE — ED Provider Notes (Signed)
[facility] DEPT Provider Note   CSN: [id number] Arrival date & time: 12/31/15  1621     History   Chief Complaint Chief Complaint  Patient presents with  . Fall    HPI Tammy DaftKayla Jenetta Snyder is a 25 y.o. female with past medical history outlined below presenting for evaluation of progressing pain and stiffness across her neck and left shoulder and her lower back since slipping getting out of the shower this am.  She describes doing a summersault over the toilet, landing upside down on her left neck and shoulder area wedged between the toilet and the wall.  She denies radiation of pain into her arms or legs and there has been no weakness or numbness or urinary or bowel retention or incontinence.  Patient does not have a history of cancer or IVDU.  The patient has taken no medicine prior to arrival as nsaids and tylenol causes severe rash.    The history is provided by the patient.    Past Medical History:  Diagnosis Date  . Bipolar 1 disorder (HCC)   . Borderline personality disorder   . Interstitial cystitis   . Mental disorder    Borderline personalities disorder  . Oliguria 2013   cystoscopy, hydrodilation under anesthesia  . Polysubstance abuse   . Vaginal Pap smear, abnormal     Patient Active Problem List   Diagnosis Date Noted  . Abnormal uterine bleeding 03/16/2011  . Low-lying placenta 07/29/2010    Past Surgical History:  Procedure Laterality Date  . Calibration of urethra with dilation of the urethra under anesthesia  04/04/2011   Dr. Rosezetta SchlatterJohn Harman  . CESAREAN SECTION  10/29/2010   Procedure: CESAREAN SECTION;  Surgeon: Lazaro ArmsLuther H Eure, MD;  Location: [facility];  Service: Gynecology;;  . COCHLEAR IMPLANT    . COCHLEAR IMPLANT    . cystoscopy, hydrodilation under anesthesia  04/04/2011    OB History    Gravida Para Term Preterm AB Living   2 2 1 1  0 1   SAB TAB Ectopic Multiple Live Births   0 0 0 0 1       Home Medications    Prior to Admission medications    Medication Sig Start Date End Date Taking? Authorizing Provider  cephALEXin (KEFLEX) 500 MG capsule Take 1 capsule (500 mg total) by mouth 4 (four) times daily. Patient not taking: Reported on 12/31/2015 08/27/15   Eber HongBrian Miller, MD  cloNIDine (CATAPRES) 0.1 MG tablet Take one every 8 hours as needed for withdrawal symptoms Patient not taking: Reported on 12/31/2015 09/01/15   Bethann BerkshireJoseph Zammit, MD  methocarbamol (ROBAXIN) 500 MG tablet Take 2 tablets (1,000 mg total) by mouth 4 (four) times daily. 12/31/15 01/10/16  Burgess AmorJulie Anthoni Geerts, PA-C  ondansetron (ZOFRAN ODT) 4 MG disintegrating tablet 4mg  ODT q4 hours prn nausea/vomit Patient not taking: Reported on 12/31/2015 09/01/15   Bethann BerkshireJoseph Zammit, MD  SPRINTEC 28 0.25-35 MG-MCG tablet Take 1 tablet by mouth daily. 07/29/15   Historical Provider, MD  traMADol (ULTRAM) 50 MG tablet Take 1 tablet (50 mg total) by mouth every 6 (six) hours as needed. 12/31/15   Burgess AmorJulie Sanjuan Sawa, PA-C    Family History Family History  Problem Relation Age of Onset  . Hypertension Father   . Stroke Father     Social History Social History  Substance Use Topics  . Smoking status: Current Some Day Smoker    Packs/day: 0.50    Years: 8.00    Types: Cigarettes  . Smokeless tobacco: Never  Used  . Alcohol use 0.0 oz/week     Comment: occ     Allergies   Latex; Nsaids; and Tylenol [acetaminophen]   Review of Systems Review of Systems  Constitutional: Negative for fever.  Respiratory: Negative for shortness of breath.   Cardiovascular: Negative for chest pain and leg swelling.  Gastrointestinal: Negative for abdominal distention, abdominal pain and constipation.  Genitourinary: Negative for difficulty urinating, dysuria, flank pain, frequency and urgency.  Musculoskeletal: Positive for arthralgias, back pain and neck pain. Negative for gait problem, joint swelling and myalgias.  Skin: Negative for rash.  Neurological: Negative for weakness and numbness.     Physical  Exam Updated Vital Signs BP 104/58 (BP Location: Left Arm)   Pulse 69   Temp 98.9 F (37.2 C) (Oral)   Resp 20   Ht 5' (1.524 m)   Wt 59 kg   LMP 12/03/2015 (Exact Date) Comment: Negative U-preg in ED 12/31/2015   SpO2 100%   BMI 25.39 kg/m   Physical Exam  Constitutional: She appears well-developed and well-nourished.  HENT:  Head: Normocephalic.  Eyes: Conjunctivae are normal.  Neck: Normal range of motion. Neck supple.  Cardiovascular: Normal rate and intact distal pulses.   Pedal pulses normal.  Pulmonary/Chest: Effort normal.  Abdominal: Soft. Bowel sounds are normal. She exhibits no distension and no mass.  Musculoskeletal: Normal range of motion. She exhibits no edema.       Lumbar back: She exhibits tenderness. She exhibits no swelling, no edema and no spasm.  Neurological: She is alert. She has normal strength. She displays no atrophy and no tremor. No sensory deficit. Gait normal.  Reflex Scores:      Patellar reflexes are 2+ on the right side and 2+ on the left side.      Achilles reflexes are 2+ on the right side and 2+ on the left side. No strength deficit noted in hip and knee flexor and extensor muscle groups.  Ankle flexion and extension intact.  Skin: Skin is warm and dry.  Psychiatric: She has a normal mood and affect.  Nursing note and vitals reviewed.    ED Treatments / Results  Labs (all labs ordered are listed, but only abnormal results are displayed) Labs Reviewed  POC URINE PREG, ED    EKG  EKG Interpretation None       Radiology Dg Cervical Spine Complete  Result Date: 12/31/2015 CLINICAL DATA:  Larey SeatFell in shower with left neck pain EXAM: CERVICAL SPINE - COMPLETE 4+ VIEW COMPARISON:  None. FINDINGS: Straightening of the cervical spine. No acute fracture or malalignment. Dens and lateral masses grossly within normal limits. Prevertebral soft tissue thickness is normal. IMPRESSION: No definite acute osseous abnormality Electronically Signed    By: Jasmine PangKim  Fujinaga M.D.   On: 12/31/2015 18:27   Dg Lumbar Spine Complete  Result Date: 12/31/2015 CLINICAL DATA:  Fall EXAM: LUMBAR SPINE - COMPLETE 4+ VIEW COMPARISON:  None. FINDINGS: There is no evidence of lumbar spine fracture. Alignment is normal. Intervertebral disc spaces are maintained. IMPRESSION: No fracture or listhesis of the lumbar spine. Electronically Signed   By: Deatra RobinsonKevin  Herman M.D.   On: 12/31/2015 18:52   Dg Shoulder Left  Result Date: 12/31/2015 CLINICAL DATA:  Left shoulder pain since a fall out of the shower this morning. Initial encounter. EXAM: LEFT SHOULDER - 2+ VIEW COMPARISON:  None. FINDINGS: There is no evidence of fracture or dislocation. There is no evidence of arthropathy or other focal bone abnormality. Soft  tissues are unremarkable. IMPRESSION: Negative exam. Electronically Signed   By: Drusilla Kanner M.D.   On: 12/31/2015 18:27    Procedures Procedures (including critical care time)  Medications Ordered in ED Medications  methocarbamol (ROBAXIN) tablet 500 mg (500 mg Oral Given 12/31/15 1722)     Initial Impression / Assessment and Plan / ED Course  I have reviewed the triage vital signs and the nursing notes.  Pertinent labs & imaging results that were available during my care of the patient were reviewed by me and considered in my medical decision making (see chart for details).  Clinical Course     Imaging reviewed and discussed with pt. Advised she may expect to be more sore over the next 2 days, then gradual improvement.  Prn f/u with pcp if not improved by 10 days.  Tramadol, robaxin, ice tx x 48 hours, adding heat on day 3.    The patient appears reasonably screened and/or stabilized for discharge and I doubt any other medical condition or other [facility] requiring further screening, evaluation, or treatment in the ED at this time prior to discharge.   Final Clinical Impressions(s) / ED Diagnoses   Final diagnoses:  Fall, initial encounter   Cervical strain, acute, initial encounter  Lumbar strain, initial encounter    New Prescriptions New Prescriptions   METHOCARBAMOL (ROBAXIN) 500 MG TABLET    Take 2 tablets (1,000 mg total) by mouth 4 (four) times daily.   TRAMADOL (ULTRAM) 50 MG TABLET    Take 1 tablet (50 mg total) by mouth every 6 (six) hours as needed.     Burgess Amor, PA-C 12/31/15 1914    Linwood Dibbles, MD 12/31/15 2051

## 2016-04-21 ENCOUNTER — Emergency Department (HOSPITAL_COMMUNITY)
Admission: EM | Admit: 2016-04-21 | Discharge: 2016-04-21 | Disposition: A | Discharge status: Home / Self Care | Payer: Medicaid Other | Attending: Emergency Medicine | Admitting: Emergency Medicine

## 2016-04-21 ENCOUNTER — Encounter (HOSPITAL_COMMUNITY): Payer: Self-pay | Admitting: Emergency Medicine

## 2016-04-21 DIAGNOSIS — N3010 Interstitial cystitis (chronic) without hematuria: Secondary | ICD-10-CM

## 2016-04-21 DIAGNOSIS — Z91040 Latex allergy status: Secondary | ICD-10-CM | POA: Diagnosis not present

## 2016-04-21 DIAGNOSIS — R102 Pelvic and perineal pain: Secondary | ICD-10-CM

## 2016-04-21 DIAGNOSIS — F17210 Nicotine dependence, cigarettes, uncomplicated: Secondary | ICD-10-CM | POA: Diagnosis not present

## 2016-04-21 DIAGNOSIS — M545 Low back pain, unspecified: Secondary | ICD-10-CM

## 2016-04-21 LAB — CBC WITH DIFFERENTIAL/PLATELET
Basophils Absolute: 0 10*3/uL (ref 0.0–0.1)
Basophils Relative: 0 %
EOS ABS: 0.1 10*3/uL (ref 0.0–0.7)
EOS PCT: 1 %
HCT: 39.3 % (ref 36.0–46.0)
Hemoglobin: 13.8 g/dL (ref 12.0–15.0)
LYMPHS ABS: 3.9 10*3/uL (ref 0.7–4.0)
Lymphocytes Relative: 50 %
MCH: 33.2 pg (ref 26.0–34.0)
MCHC: 35.1 g/dL (ref 30.0–36.0)
MCV: 94.5 fL (ref 78.0–100.0)
MONO ABS: 0.5 10*3/uL (ref 0.1–1.0)
MONOS PCT: 7 %
Neutro Abs: 3.2 10*3/uL (ref 1.7–7.7)
Neutrophils Relative %: 42 %
PLATELETS: 177 10*3/uL (ref 150–400)
RBC: 4.16 MIL/uL (ref 3.87–5.11)
RDW: 12.9 % (ref 11.5–15.5)
WBC: 7.8 10*3/uL (ref 4.0–10.5)

## 2016-04-21 LAB — COMPREHENSIVE METABOLIC PANEL
ALT: 12 U/L — AB (ref 14–54)
AST: 17 U/L (ref 15–41)
Albumin: 4.7 g/dL (ref 3.5–5.0)
Alkaline Phosphatase: 91 U/L (ref 38–126)
Anion gap: 8 (ref 5–15)
BILIRUBIN TOTAL: 0.5 mg/dL (ref 0.3–1.2)
BUN: 8 mg/dL (ref 6–20)
CHLORIDE: 109 mmol/L (ref 101–111)
CO2: 23 mmol/L (ref 22–32)
CREATININE: 0.6 mg/dL (ref 0.44–1.00)
Calcium: 9.4 mg/dL (ref 8.9–10.3)
Glucose, Bld: 85 mg/dL (ref 65–99)
Potassium: 3.1 mmol/L — ABNORMAL LOW (ref 3.5–5.1)
Sodium: 140 mmol/L (ref 135–145)
TOTAL PROTEIN: 7.7 g/dL (ref 6.5–8.1)

## 2016-04-21 LAB — URINALYSIS, ROUTINE W REFLEX MICROSCOPIC
BILIRUBIN URINE: NEGATIVE
GLUCOSE, UA: NEGATIVE mg/dL
HGB URINE DIPSTICK: NEGATIVE
Ketones, ur: NEGATIVE mg/dL
Leukocytes, UA: NEGATIVE
Nitrite: NEGATIVE
PROTEIN: NEGATIVE mg/dL
Specific Gravity, Urine: 1.023 (ref 1.005–1.030)
pH: 7 (ref 5.0–8.0)

## 2016-04-21 LAB — PREGNANCY, URINE: PREG TEST UR: NEGATIVE

## 2016-04-21 MED ORDER — OXYBUTYNIN CHLORIDE ER 10 MG PO TB24: 10.0000 mg | ORAL_TABLET | Freq: Every day | ORAL | 0 refills | Status: DC

## 2016-04-21 NOTE — Discharge Instructions (Signed)
Please read instructions below. Talk with your primary care provider about any new medications. Schedule a follow up appointment with your urologist about your new medication, Oxybutynin (Ditropan). Return to ER for inability to urinate, fever, new or worsening symptoms.

## 2016-04-21 NOTE — ED Provider Notes (Signed)
[facility] Provider Note   CSN: [id number] Arrival date & time: 04/21/16  1236     History   Chief Complaint Chief Complaint  Patient presents with  . Flank Pain    HPI Tammy Snyder is a 26 y.o. female.  P w PMHx interstitial cystitis, recurrent UTI, hydro-distension of bladder, and substance abuse, presents w b/l low back pain and nausea. Pt reports she recently finished 2 week course of Keflex 1 week ago for UTI and does not feel better. Reports back pain and constant suprapubic discomfort, that she describes as spasm and pressure, began yesterday. Reports dec appetite, nausea, inc urinary freq, dysuria, and pressure in her vagina w urination. Denies F/C, back injury. States she does not take pain medication at home d/t NSAID and tylenol allergy (causes her throat to close).      Past Medical History:  Diagnosis Date  . Bipolar 1 disorder (HCC)   . Borderline personality disorder   . Interstitial cystitis   . Mental disorder    Borderline personalities disorder  . Oliguria 2013   cystoscopy, hydrodilation under anesthesia  . Polysubstance abuse   . Vaginal Pap smear, abnormal     Patient Active Problem List   Diagnosis Date Noted  . Abnormal uterine bleeding 03/16/2011  . Low-lying placenta 07/29/2010    Past Surgical History:  Procedure Laterality Date  . Calibration of urethra with dilation of the urethra under anesthesia  04/04/2011   Dr. Rosezetta Schlatter  . CESAREAN SECTION  10/29/2010   Procedure: CESAREAN SECTION;  Surgeon: Lazaro Arms, MD;  Location: [facility];  Service: Gynecology;;  . COCHLEAR IMPLANT    . COCHLEAR IMPLANT    . cystoscopy, hydrodilation under anesthesia  04/04/2011    OB History    Gravida Para Term Preterm AB Living   0 1   SAB TAB Ectopic Multiple Live Births   0 0 0 0 1       Home Medications    Prior to Admission medications   Medication Sig Start Date End Date Taking? Authorizing Provider  oxybutynin  (DITROPAN XL) 10 MG 24 hr tablet Take 1 tablet (10 mg total) by mouth at bedtime. 04/21/16   [place] N Russo, PA-C    Family History Family History  Problem Relation Age of Onset  . Hypertension Father   . Stroke Father     Social History Social History  Substance Use Topics  . Smoking status: Current Some Day Smoker    Packs/day: 0.25    Years: 8.00    Types: Cigarettes  . Smokeless tobacco: Never Used  . Alcohol use 0.0 oz/week     Comment: occ     Allergies   Latex; Nsaids; and Tylenol [acetaminophen]   Review of Systems Review of Systems  Constitutional: Positive for appetite change. Negative for chills and fever.  Gastrointestinal: Positive for abdominal pain (suprapubic) and nausea. Negative for vomiting.  Genitourinary: Positive for dysuria and frequency.  Musculoskeletal: Positive for back pain (b/l low back).  All other systems reviewed and are negative.    Physical Exam Updated Vital Signs BP 110/66 (BP Location: Left Arm)   Pulse (!) 59   Temp 98.1 F (36.7 C) (Oral)   Resp 18   Ht 5' 0.5" (1.537 m)   Wt 54.4 kg   LMP 04/14/2016   SpO2 100%   BMI 23.05 kg/m   Physical Exam  Constitutional: She is oriented to person, place, and time.  She appears well-developed and well-nourished.  HENT:  Head: Normocephalic and atraumatic.  Eyes: Conjunctivae are normal.  Neck: Normal range of motion.  Cardiovascular: Normal rate, regular rhythm, normal heart sounds and intact distal pulses.   No murmur heard. Pulmonary/Chest: Effort normal and breath sounds normal. No respiratory distress. She has no wheezes. She has no rales.  Abdominal: Soft. Bowel sounds are normal. She exhibits no distension and no mass. There is tenderness (suprapubic). There is no rebound and no guarding.  b/l CVA tenderness to percussion  Musculoskeletal: Normal range of motion.  TTP low back - lateral to paraspinal musculature.   Neurological: She is alert and oriented to person, place,  and time.  Skin: Skin is warm and dry.  Psychiatric: She has a normal mood and affect. Her behavior is normal.  Nursing note and vitals reviewed.    ED Treatments / Results  Labs (all labs ordered are listed, but only abnormal results are displayed) Labs Reviewed  URINALYSIS, ROUTINE W REFLEX MICROSCOPIC - Abnormal; Notable for the following:       Result Value   APPearance CLOUDY (*)    All other components within normal limits  COMPREHENSIVE METABOLIC PANEL - Abnormal; Notable for the following:    Potassium 3.1 (*)    ALT 12 (*)    All other components within normal limits  URINE CULTURE  PREGNANCY, URINE  CBC WITH DIFFERENTIAL/PLATELET    EKG  EKG Interpretation None       Radiology No results found.  Procedures Procedures (including critical care time)  Medications Ordered in ED Medications - No data to display   Initial Impression / Assessment and Plan / ED Course  I have reviewed the triage vital signs and the nursing notes.  Pertinent labs & imaging results that were available during my care of the patient were reviewed by me and considered in my medical decision making (see chart for details).      Pt w suprapublic abdl pain and b/l low back pain. PMHx of interstitial cystitis. Recent course of Keflex for UTI. Exam reassuring. U/A, CBC, CMP all reassuring; no signs of persistent UTI, no indication for nephrolithiasis or pyelonephritis. Pt afebrile, nontoxic. Bedside U/S by Dr. Hyacinth Meeker reassuring; no obstructive uropathy, no hydronephrosis, no stones. Provided reassurance to pt. Suspect bladder spasm causing suprapubic discomfort, will send home w Oxybutynin. Referred pt to local urologist for follow up on new medication. Advised PCP follow up if she cannot make appt in next 1-2 weeks w urologist. Pt hemodynamically stable, not in distress, nontoxic, safe for discharge home.  Patient discussed with and seen by Dr. Hyacinth Meeker. Discussed results, findings,  treatment and follow up. Patient advised of return precautions. Patient verbalized understanding and agreed with plan.    Final Clinical Impressions(s) / ED Diagnoses   Final diagnoses:  Acute bilateral low back pain without sciatica  Suprapubic abdominal pain  Chronic interstitial cystitis    New Prescriptions Discharge Medication List as of 04/21/2016  3:24 PM    START taking these medications   Details  oxybutynin (DITROPAN XL) 10 MG 24 hr tablet Take 1 tablet (10 mg total) by mouth at bedtime., Starting Thu 04/21/2016, Print         [place] N Russo, PA-C 04/21/16 1612    Eber Hong, MD 04/22/16 [phone]

## 2016-04-21 NOTE — ED Notes (Signed)
Pt made aware to return if symptoms worsen or if any life threatening symptoms occur.   

## 2016-04-21 NOTE — ED Provider Notes (Signed)
Medical screening examination/treatment/procedure(s) were conducted as a shared visit with non-physician practitioner(s) and myself.  I personally evaluated the patient during the encounter.  Clinical Impression:   Final diagnoses:  Acute bilateral low back pain without sciatica  Suprapubic abdominal pain  Chronic interstitial cystitis      The patient is a 26 year old female, she does have a history of interstitial cystitis and has been complaining of some urinary symptoms as well as lower back pain. This has been going on for 2 weeks. She denies fevers.  Sx are persistent, the pain waxes and wanes, no associated fevers, moving around makes this worse.    On exam the patient has a soft abdomen with minimal tenderness in the suprapubic area and bilateral lower abdomen. She has clear heart and lung sounds without tachycardia.  EMERGENCY DEPARTMENT US RENAL EXAM  "Study: Limited Retroperitoneal Ultrasound of Kidneys"  INDICATIONS: Flank pain Long and short axis of both kidneys were obtained.   PERFORMED BY: Myself IMAGES ARCHIVED?: Yes LIMITATIONS: None VIEWS USED: Long axis, Short axis and Bladder  INTERPRETATION: No Hydronephrosis, No Renal cyst, No Kidney stone   Pt informed of results - has no signs of UTI / hydronephrosis.  Can be d/c with meds for spasm, can f/u outpt - pt agreeable.      Eber Hong, MD 04/22/16 [phone]

## 2016-04-21 NOTE — ED Triage Notes (Signed)
PT c/o increased urinary frequency and painful urination with left sided flank pain and back pain that started x4 days ago and states has hx of recurrent UTIs. PT also states decreased appetite with nausea.

## 2016-04-23 LAB — URINE CULTURE

## 2016-04-24 ENCOUNTER — Telehealth: Payer: Self-pay

## 2016-04-24 NOTE — Telephone Encounter (Signed)
No treatment needed for  UC 04/21/16 per Fara Chute Pharm D

## 2016-06-26 ENCOUNTER — Encounter (HOSPITAL_COMMUNITY): Payer: Self-pay | Admitting: *Deleted

## 2016-06-26 DIAGNOSIS — N10 Acute tubulo-interstitial nephritis: Secondary | ICD-10-CM | POA: Diagnosis not present

## 2016-06-26 DIAGNOSIS — Z79899 Other long term (current) drug therapy: Secondary | ICD-10-CM | POA: Insufficient documentation

## 2016-06-26 DIAGNOSIS — R1031 Right lower quadrant pain: Secondary | ICD-10-CM | POA: Diagnosis present

## 2016-06-26 DIAGNOSIS — F17210 Nicotine dependence, cigarettes, uncomplicated: Secondary | ICD-10-CM | POA: Insufficient documentation

## 2016-06-26 NOTE — ED Triage Notes (Signed)
Pt c/o right flank pain that started a few days ago

## 2016-06-27 ENCOUNTER — Emergency Department (HOSPITAL_COMMUNITY)
Admission: EM | Admit: 2016-06-27 | Discharge: 2016-06-27 | Disposition: A | Discharge status: Home / Self Care | Payer: Medicaid Other | Attending: Emergency Medicine | Admitting: Emergency Medicine

## 2016-06-27 DIAGNOSIS — N10 Acute tubulo-interstitial nephritis: Secondary | ICD-10-CM

## 2016-06-27 LAB — COMPREHENSIVE METABOLIC PANEL
ALBUMIN: 4.5 g/dL (ref 3.5–5.0)
ALK PHOS: 93 U/L (ref 38–126)
ALT: 20 U/L (ref 14–54)
ANION GAP: 12 (ref 5–15)
AST: 24 U/L (ref 15–41)
BILIRUBIN TOTAL: 0.8 mg/dL (ref 0.3–1.2)
BUN: 8 mg/dL (ref 6–20)
CALCIUM: 9.4 mg/dL (ref 8.9–10.3)
CO2: 20 mmol/L — ABNORMAL LOW (ref 22–32)
Chloride: 105 mmol/L (ref 101–111)
Creatinine, Ser: 0.61 mg/dL (ref 0.44–1.00)
GFR calc Af Amer: >60 mL/min (ref >60)
GFR calc non Af Amer: >60 mL/min (ref >60)
Glucose, Bld: 97 mg/dL (ref 65–99)
Potassium: 3.3 mmol/L — ABNORMAL LOW (ref 3.5–5.1)
Sodium: 137 mmol/L (ref 135–145)
TOTAL PROTEIN: 8 g/dL (ref 6.5–8.1)

## 2016-06-27 LAB — RAPID URINE DRUG SCREEN, HOSP PERFORMED
AMPHETAMINES: NOT DETECTED
BARBITURATES: NOT DETECTED
BENZODIAZEPINES: NOT DETECTED
COCAINE: POSITIVE — AB
Opiates: NOT DETECTED
Tetrahydrocannabinol: POSITIVE — AB

## 2016-06-27 LAB — URINALYSIS, ROUTINE W REFLEX MICROSCOPIC
Bilirubin Urine: NEGATIVE
GLUCOSE, UA: NEGATIVE mg/dL
HGB URINE DIPSTICK: NEGATIVE
Ketones, ur: 20 mg/dL — AB
NITRITE: POSITIVE — AB
Protein, ur: 30 mg/dL — AB
SPECIFIC GRAVITY, URINE: 1.015 (ref 1.005–1.030)
pH: 7 (ref 5.0–8.0)

## 2016-06-27 LAB — CBC WITH DIFFERENTIAL/PLATELET
BASOS ABS: 0 10*3/uL (ref 0.0–0.1)
BASOS PCT: 0 %
EOS ABS: 0.1 10*3/uL (ref 0.0–0.7)
EOS PCT: 0 %
HCT: 38.2 % (ref 36.0–46.0)
Hemoglobin: 13.3 g/dL (ref 12.0–15.0)
Lymphocytes Relative: 22 %
Lymphs Abs: 3.3 10*3/uL (ref 0.7–4.0)
MCH: 32.6 pg (ref 26.0–34.0)
MCHC: 34.8 g/dL (ref 30.0–36.0)
MCV: 93.6 fL (ref 78.0–100.0)
MONO ABS: 1.3 10*3/uL — AB (ref 0.1–1.0)
Monocytes Relative: 9 %
NEUTROS ABS: 10 10*3/uL — AB (ref 1.7–7.7)
Neutrophils Relative %: 69 %
PLATELETS: 164 10*3/uL (ref 150–400)
RBC: 4.08 MIL/uL (ref 3.87–5.11)
RDW: 12.9 % (ref 11.5–15.5)
WBC: 14.6 10*3/uL — ABNORMAL HIGH (ref 4.0–10.5)

## 2016-06-27 LAB — LIPASE, BLOOD: Lipase: 23 U/L (ref 11–51)

## 2016-06-27 LAB — POC URINE PREG, ED: Preg Test, Ur: NEGATIVE

## 2016-06-27 MED ORDER — OXYCODONE HCL 5 MG PO TABS: 5.0000 mg | ORAL_TABLET | ORAL | 0 refills | Status: DC | PRN

## 2016-06-27 MED ORDER — ONDANSETRON HCL 4 MG/2ML IJ SOLN
4.0000 mg | Freq: Once | INTRAMUSCULAR | Status: AC
  Administered 2016-06-27: 4 mg via INTRAVENOUS
  Filled 2016-06-27: qty 2

## 2016-06-27 MED ORDER — DEXTROSE 5 % IV SOLN
1.0000 g | Freq: Once | INTRAVENOUS | Status: AC
  Administered 2016-06-27: 1 g via INTRAVENOUS
  Filled 2016-06-27: qty 10

## 2016-06-27 MED ORDER — MORPHINE SULFATE (PF) 4 MG/ML IV SOLN
4.0000 mg | Freq: Once | INTRAVENOUS | Status: AC
  Administered 2016-06-27: 4 mg via INTRAVENOUS
  Filled 2016-06-27: qty 1

## 2016-06-27 MED ORDER — SODIUM CHLORIDE 0.9 % IV BOLUS (SEPSIS)
1000.0000 mL | Freq: Once | INTRAVENOUS | Status: AC
  Administered 2016-06-27: 1000 mL via INTRAVENOUS

## 2016-06-27 MED ORDER — NITROFURANTOIN MONOHYD MACRO 100 MG PO CAPS: 100.0000 mg | ORAL_CAPSULE | Freq: Two times a day (BID) | ORAL | 0 refills | Status: DC

## 2016-06-27 NOTE — ED Provider Notes (Signed)
[facility] Provider Note   CSN: [id number] Arrival date & time: 06/26/16  2243     History   Chief Complaint Chief Complaint  Patient presents with  . Flank Pain    PT DEAF    HPI Tammy Snyder is a 26 y.o. female.  The history is provided by the patient. A language interpreter was used.  She comes in complaining of pain in her right flank for the last 3 days. She has a long history of interstitial cystitis and is worried that infection has spread to her kidney. Pain is rated at 10/10. She denies fever but has had chills. There has been no nausea or vomiting. She has chronic dysuria which is unchanged from baseline. Nothing makes pain better nothing makes it worse.  Past Medical History:  Diagnosis Date  . Bipolar 1 disorder (HCC)   . Borderline personality disorder   . Interstitial cystitis   . Mental disorder    Borderline personalities disorder  . Oliguria 2013   cystoscopy, hydrodilation under anesthesia  . Polysubstance abuse   . Vaginal Pap smear, abnormal     Patient Active Problem List   Diagnosis Date Noted  . Abnormal uterine bleeding 03/16/2011  . Low-lying placenta 07/29/2010    Past Surgical History:  Procedure Laterality Date  . Calibration of urethra with dilation of the urethra under anesthesia  04/04/2011   Dr. Rosezetta SchlatterJohn Harman  . CESAREAN SECTION  10/29/2010   Procedure: CESAREAN SECTION;  Surgeon: Lazaro ArmsLuther H Eure, MD;  Location: [facility];  Service: Gynecology;;  . COCHLEAR IMPLANT    . COCHLEAR IMPLANT    . cystoscopy, hydrodilation under anesthesia  04/04/2011    OB History    Gravida Para Term Preterm AB Living   2 2 1 1  0 1   SAB TAB Ectopic Multiple Live Births   0 0 0 0 1       Home Medications    Prior to Admission medications   Medication Sig Start Date End Date Taking? Authorizing Provider  oxybutynin (DITROPAN XL) 10 MG 24 hr tablet Take 1 tablet (10 mg total) by mouth at bedtime. 04/21/16   Russo, [place] N, PA-C    Family  History Family History  Problem Relation Age of Onset  . Hypertension Father   . Stroke Father     Social History Social History  Substance Use Topics  . Smoking status: Current Some Day Smoker    Packs/day: 0.25    Years: 8.00    Types: Cigarettes  . Smokeless tobacco: Never Used  . Alcohol use 0.0 oz/week     Comment: occ     Allergies   Latex; Nsaids; and Tylenol [acetaminophen]   Review of Systems Review of Systems  All other systems reviewed and are negative.    Physical Exam Updated Vital Signs BP (!) 116/58   Pulse 99   Temp 99 F (37.2 C) (Oral)   Resp 20   Ht 5' 0.5" (1.537 m)   Wt 54.4 kg (120 lb)   LMP 06/02/2016   SpO2 99%   BMI 23.05 kg/m   Physical Exam  Nursing note and vitals reviewed.  26 year old female, crying and uncomfortable appearing, but in no acute distress. Vital signs are normal. Oxygen saturation is 99%, which is normal. Head is normocephalic and atraumatic. PERRLA, EOMI. Oropharynx is clear. Neck is nontender and supple without adenopathy or JVD. Back is nontender in the midline. There is moderate right CVA tenderness. Lungs  are clear without rales, wheezes, or rhonchi. Chest is nontender. Heart has regular rate and rhythm without murmur. Abdomen is soft, flat, nontender without masses or hepatosplenomegaly and peristalsis is normoactive. Extremities have no cyanosis or edema, full range of motion is present. Skin is warm and dry without rash. Neurologic: Mental status is normal, cranial nerves are intact, there are no motor or sensory deficits.  ED Treatments / Results  Labs (all labs ordered are listed, but only abnormal results are displayed) Labs Reviewed  COMPREHENSIVE METABOLIC PANEL - Abnormal; Notable for the following:       Result Value   Potassium 3.3 (*)    CO2 20 (*)    All other components within normal limits  CBC WITH DIFFERENTIAL/PLATELET - Abnormal; Notable for the following:    WBC 14.6 (*)    Neutro  Abs 10.0 (*)    Monocytes Absolute 1.3 (*)    All other components within normal limits  URINALYSIS, ROUTINE W REFLEX MICROSCOPIC - Abnormal; Notable for the following:    APPearance HAZY (*)    Ketones, ur 20 (*)    Protein, ur 30 (*)    Nitrite POSITIVE (*)    Leukocytes, UA MODERATE (*)    Bacteria, UA RARE (*)    Squamous Epithelial / LPF 6-30 (*)    Non Squamous Epithelial 0-5 (*)    All other components within normal limits  RAPID URINE DRUG SCREEN, HOSP PERFORMED - Abnormal; Notable for the following:    Cocaine POSITIVE (*)    Tetrahydrocannabinol POSITIVE (*)    All other components within normal limits  URINE CULTURE  LIPASE, BLOOD  POC URINE PREG, ED     Procedures Procedures (including critical care time)  Medications Ordered in ED Medications  sodium chloride 0.9 % bolus 1,000 mL (0 mLs Intravenous Stopped 06/27/16 0233)  ondansetron (ZOFRAN) injection 4 mg (4 mg Intravenous Given 06/27/16 0140)  morphine 4 MG/ML injection 4 mg (4 mg Intravenous Given 06/27/16 0141)  cefTRIAXone (ROCEPHIN) 1 g in dextrose 5 % 50 mL IVPB (0 g Intravenous Stopped 06/27/16 0257)  morphine 4 MG/ML injection 4 mg (4 mg Intravenous Given 06/27/16 6045)     Initial Impression / Assessment and Plan / ED Course  I have reviewed the triage vital signs and the nursing notes.  Pertinent labs & imaging results that were available during my care of the patient were reviewed by me and considered in my medical decision making (see chart for details).  Right flank pain in patient with known interstitial cystitis. Old records are reviewed showing prior ED visit for pyelonephritis and another visit for interstitial cystitis. 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 We'll check drug screen today. Unfortunately, she is allergic to NSAIDs, so we'll need to use low doses of narcotics for pain control.  Drug screen is positive for cocaine. Patient states she just used  cocaine 5 days ago. Urinalysis shows clear evidence of infection. Patient clinically has pyelonephritis. No vomiting, so she should be able to be treated as an outpatient. She's given a dose of ceftriaxone in the ED and discharged with prescription for nitrofurantoin. She was given morphine for pain which did need to be repeated. She is given a prescription for a small number of oxycodone tablets. Follow-up with PCP.  Final Clinical Impressions(s) / ED Diagnoses   Final diagnoses:  Pyelonephritis, acute    New Prescriptions New Prescriptions   NITROFURANTOIN, MACROCRYSTAL-MONOHYDRATE, (MACROBID) 100 MG  CAPSULE    Take 1 capsule (100 mg total) by mouth 2 (two) times daily.   OXYCODONE (ROXICODONE) 5 MG IMMEDIATE RELEASE TABLET    Take 1 tablet (5 mg total) by mouth every 4 (four) hours as needed for severe pain.     Dione Booze, MD 06/27/16 [phone]

## 2016-06-29 LAB — URINE CULTURE

## 2016-06-30 ENCOUNTER — Telehealth: Payer: Self-pay | Admitting: Emergency Medicine

## 2016-06-30 NOTE — Telephone Encounter (Signed)
Post ED Visit - Positive Culture Follow-up  Culture report reviewed by antimicrobial stewardship pharmacist:  []  Enzo BiNathan Batchelder, Pharm.D. []  Celedonio MiyamotoJeremy Frens, Pharm.D., BCPS AQ-ID []  Garvin FilaMike Maccia, Pharm.D., BCPS [x]  Georgina PillionElizabeth Martin, Pharm.D., BCPS []  WashingtonMinh Pham, 17[address] Rainbow BoulevardPharm.D., BCPS, AAHIVP []  Estella HuskMichelle Turner, Pharm.D., BCPS, AAHIVP []  Lysle Pearlachel Rumbarger, PharmD, BCPS []  Casilda Carlsaylor Stone, PharmD, BCPS []  Pollyann SamplesAndy Johnston, PharmD, BCPS  Positive urine culture Treated with nitrofurantoin, organism sensitive to the same and no further patient follow-up is required at this time.  Berle MullMiller, Oria Klimas 06/30/2016, 11:08 AM

## 2017-01-03 IMAGING — DX DG LUMBAR SPINE COMPLETE 4+V
5 series · 5 of 5 positions shown · non-contrast
Comparison: None.

CLINICAL DATA: Fall

EXAM:
LUMBAR SPINE - COMPLETE 4+ VIEW

[l-spine ap]
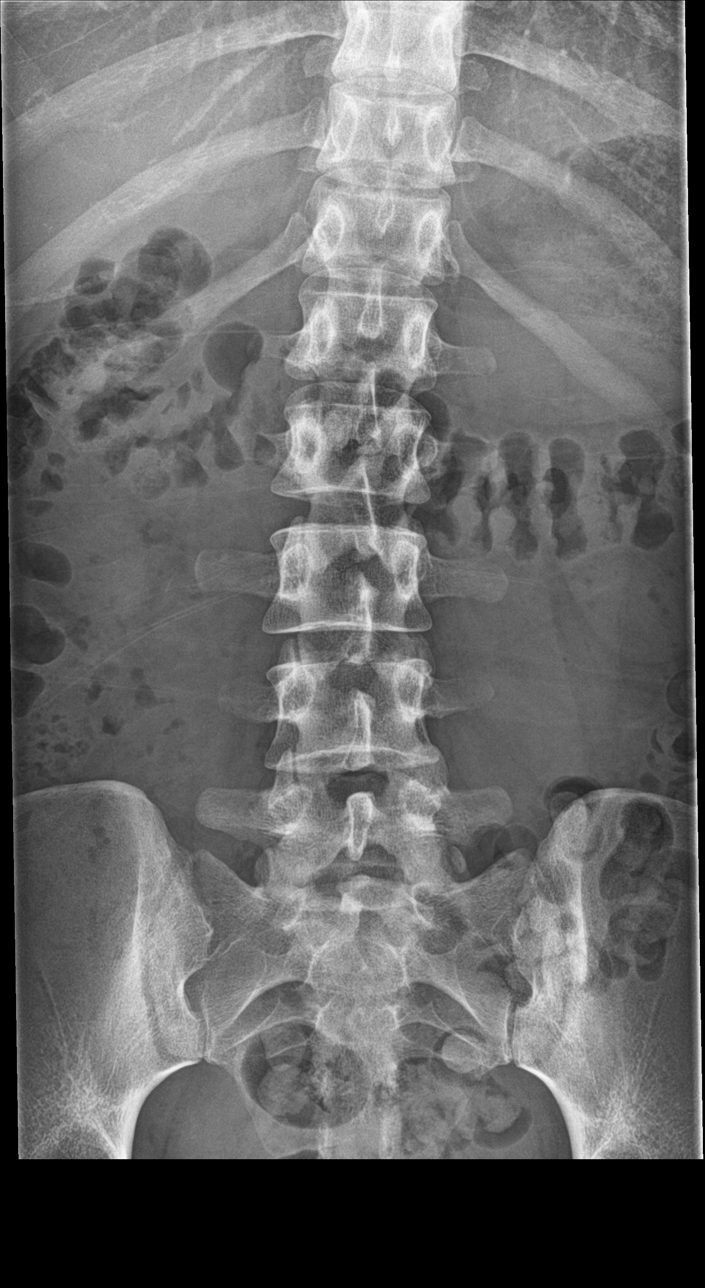

[l-spine obl (1 of 2)]
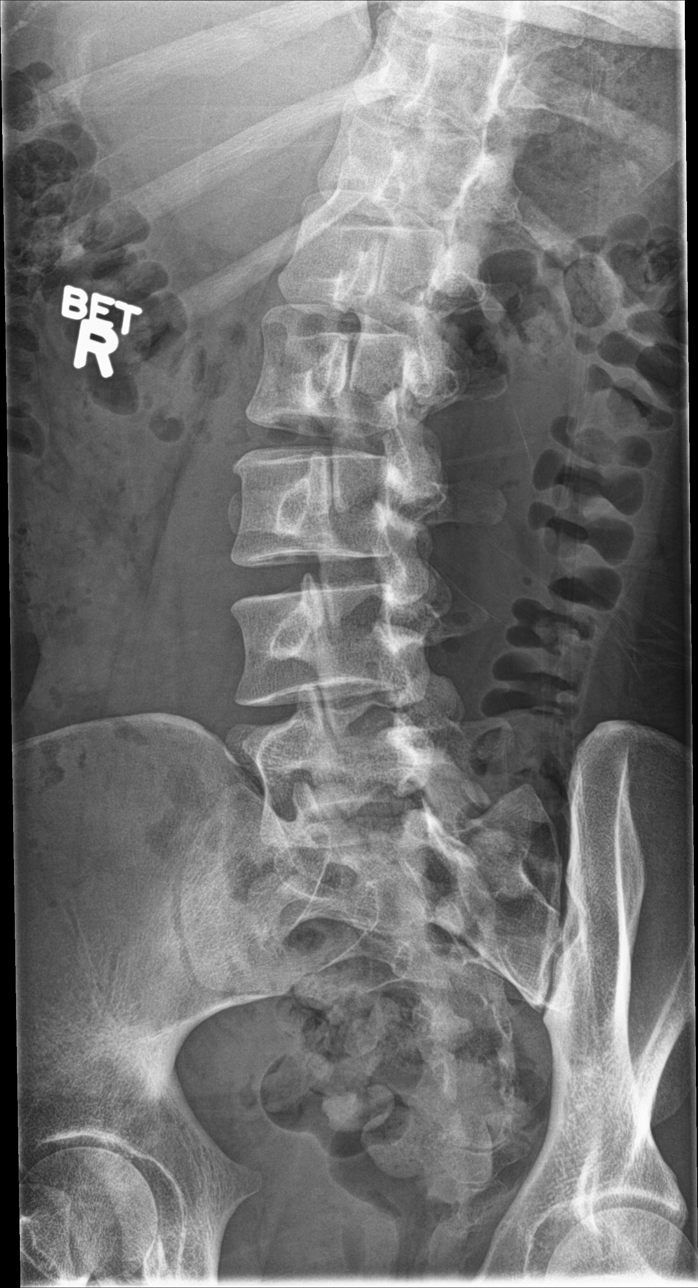

[l-spine obl (2 of 2)]
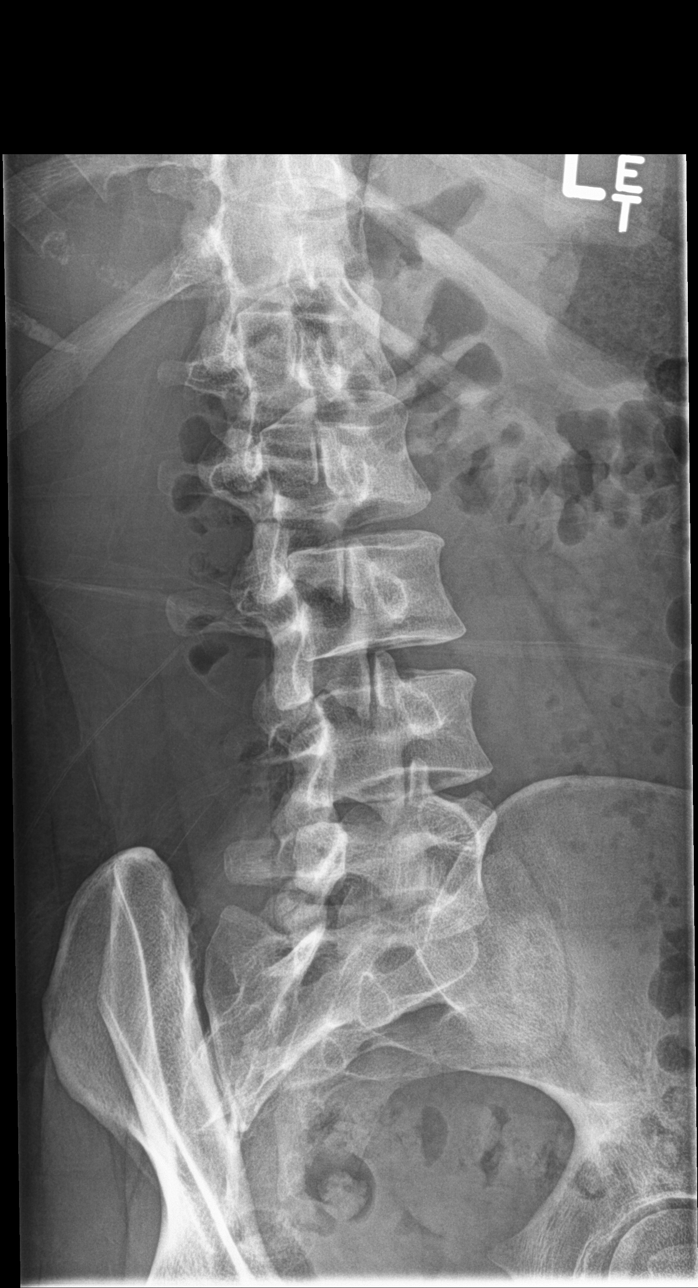

[l-spine lat]
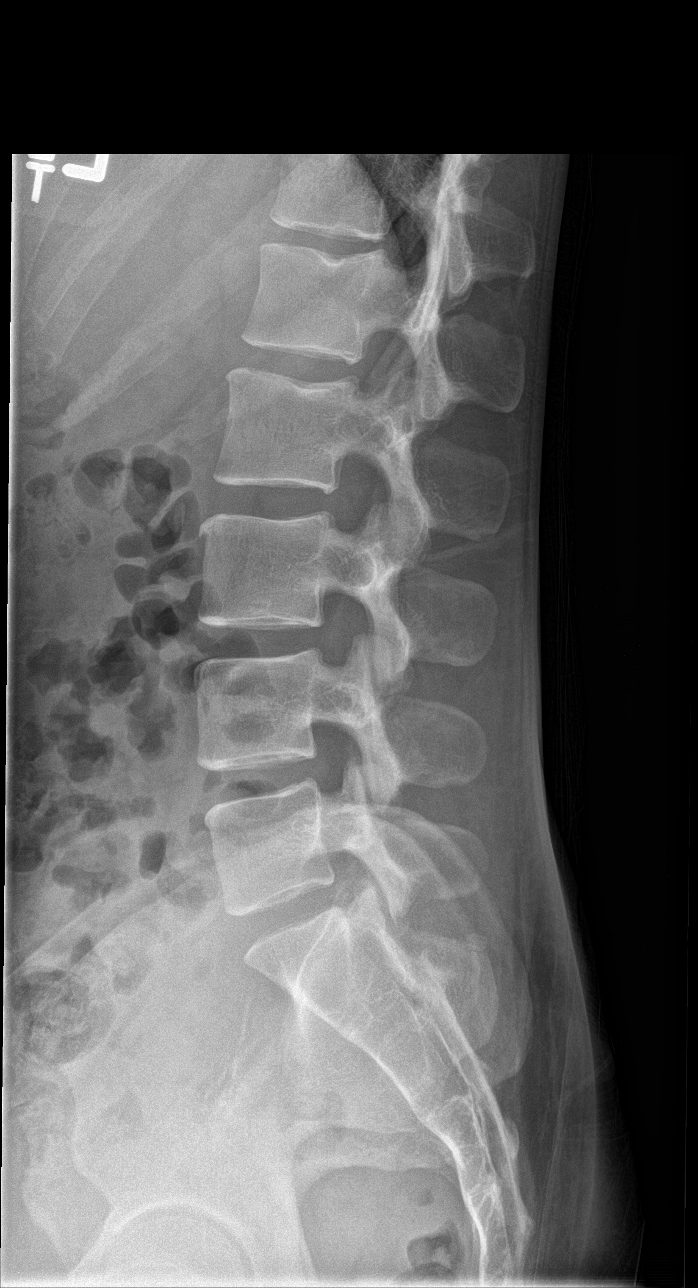

[l-spine spot]
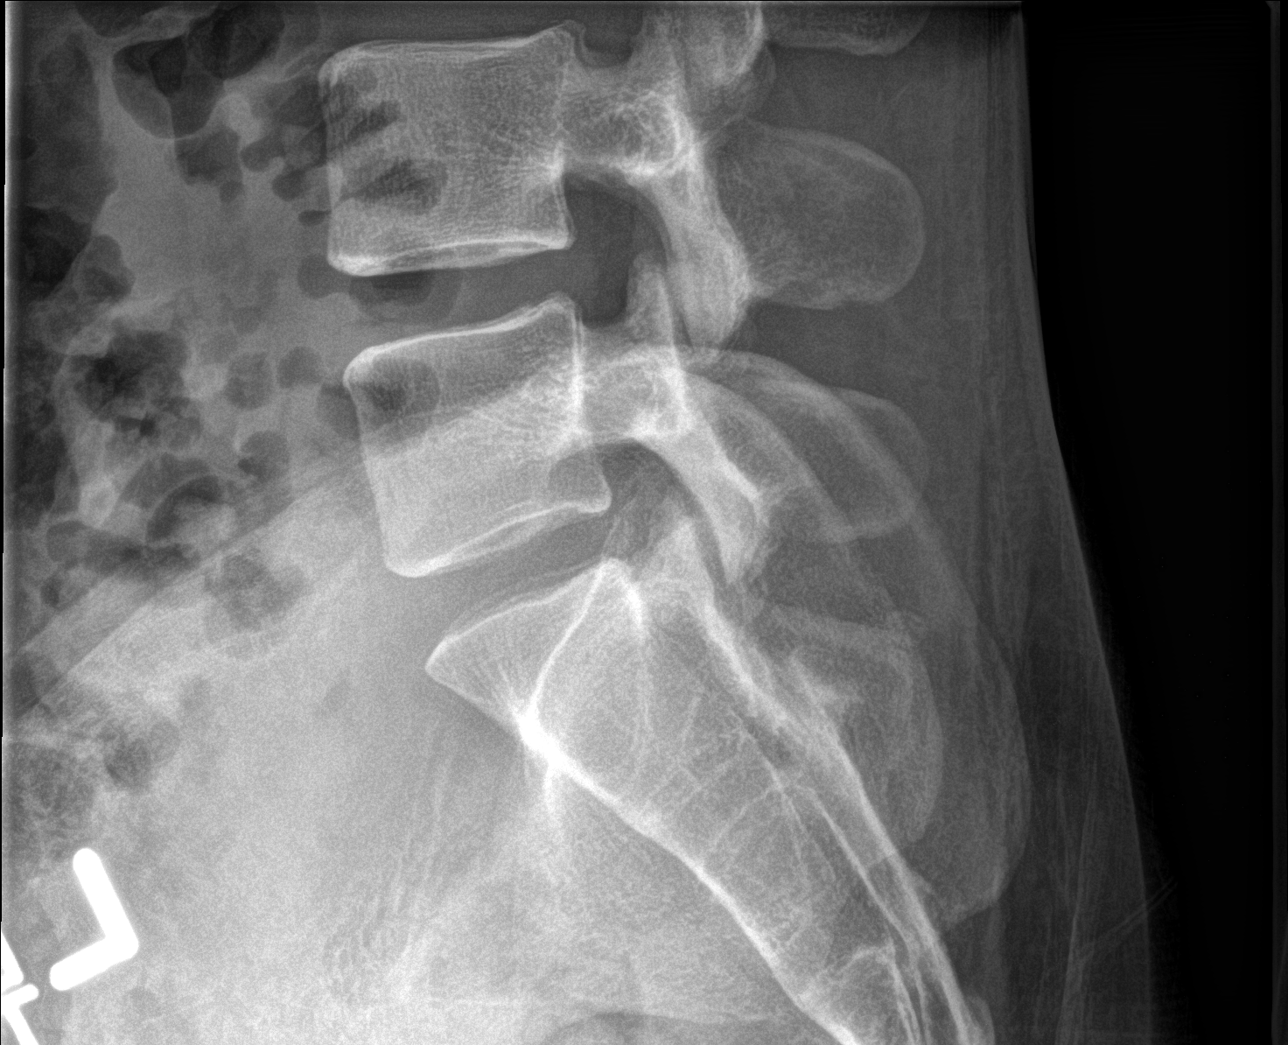

[5 of 5 positions shown; findings below may reference images not displayed]

FINDINGS: There is no evidence of lumbar spine fracture. Alignment is normal.
Intervertebral disc spaces are maintained.
IMPRESSION: No fracture or listhesis of the lumbar spine.

## 2017-01-03 IMAGING — DX DG CERVICAL SPINE COMPLETE 4+V
5 series · 5 of 5 positions shown · non-contrast
Comparison: None.

CLINICAL DATA: Fell in shower with left neck pain

EXAM:
CERVICAL SPINE - COMPLETE 4+ VIEW

[c-spine lat]
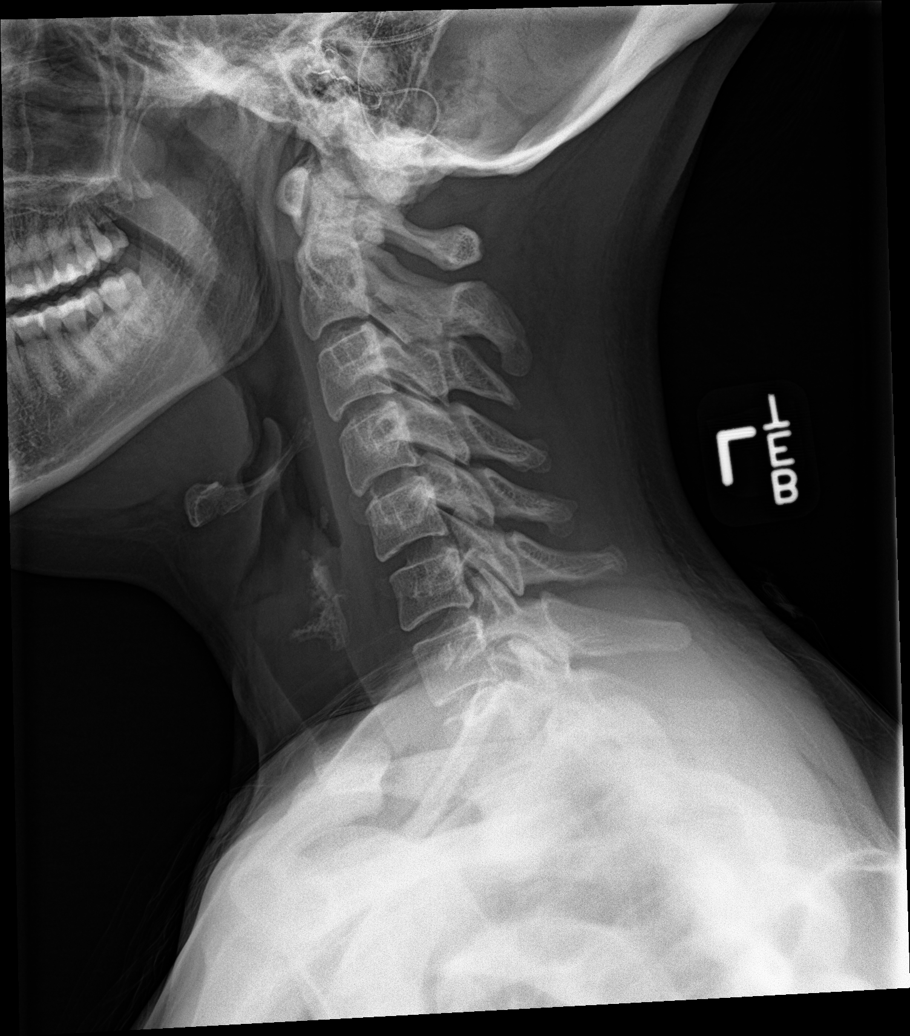

[c-spine obl (1 of 2)]
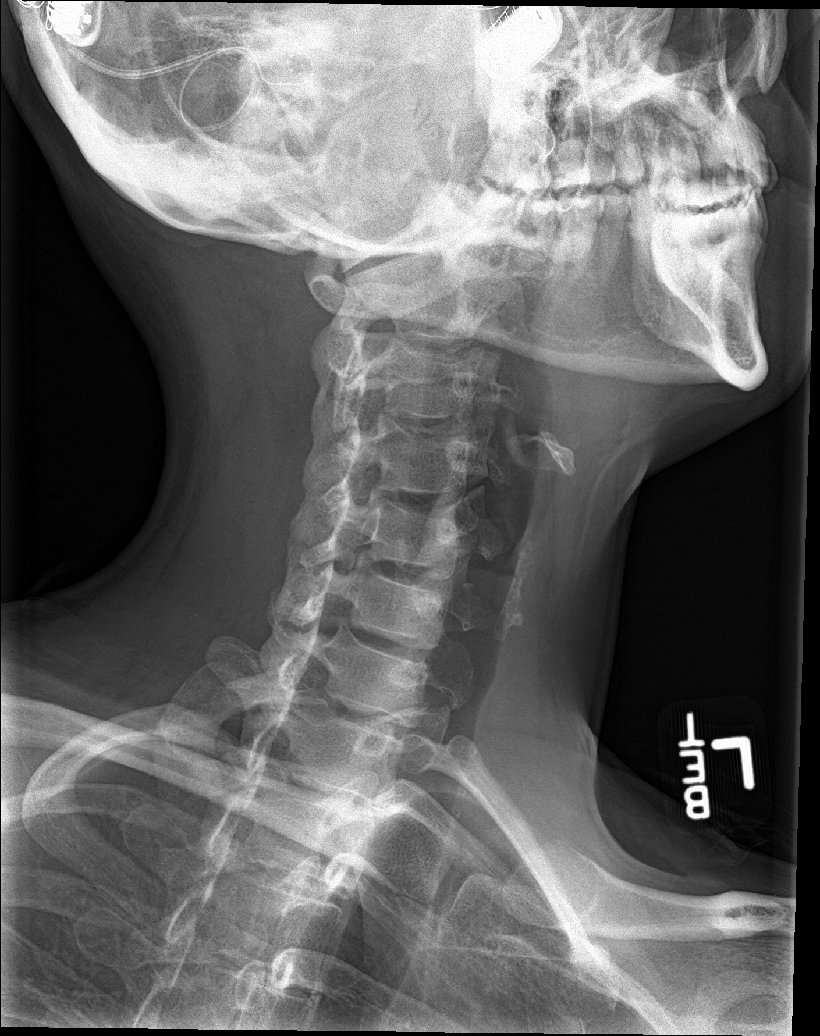

[c-spine obl (2 of 2)]
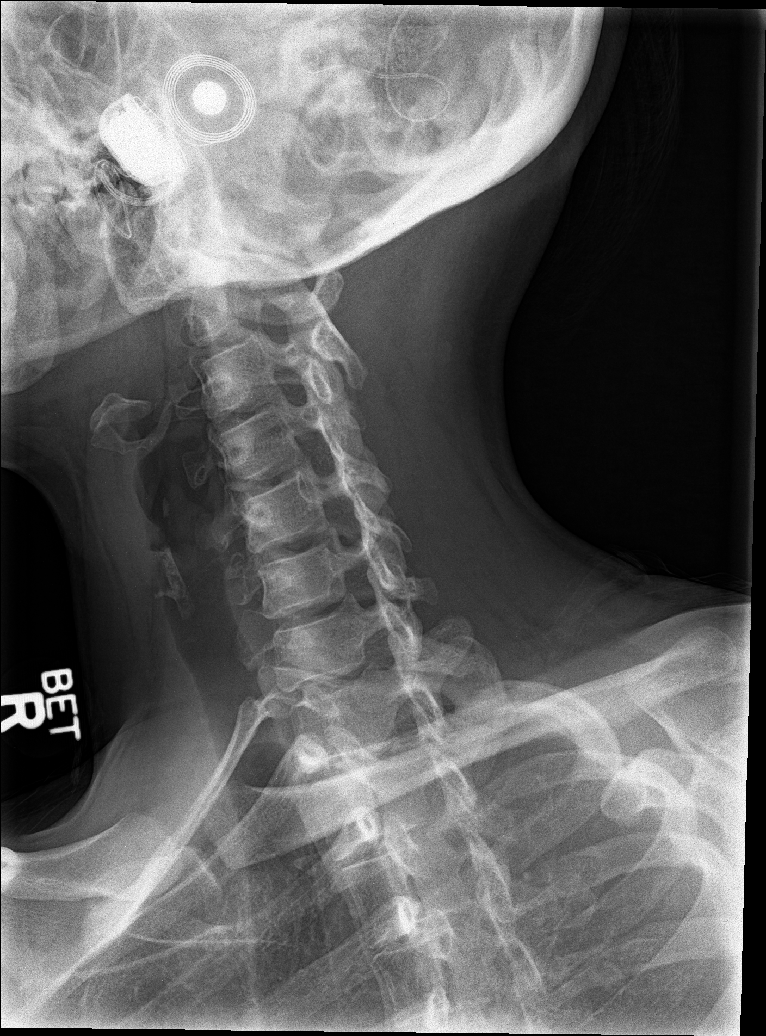

[c-spine ap]
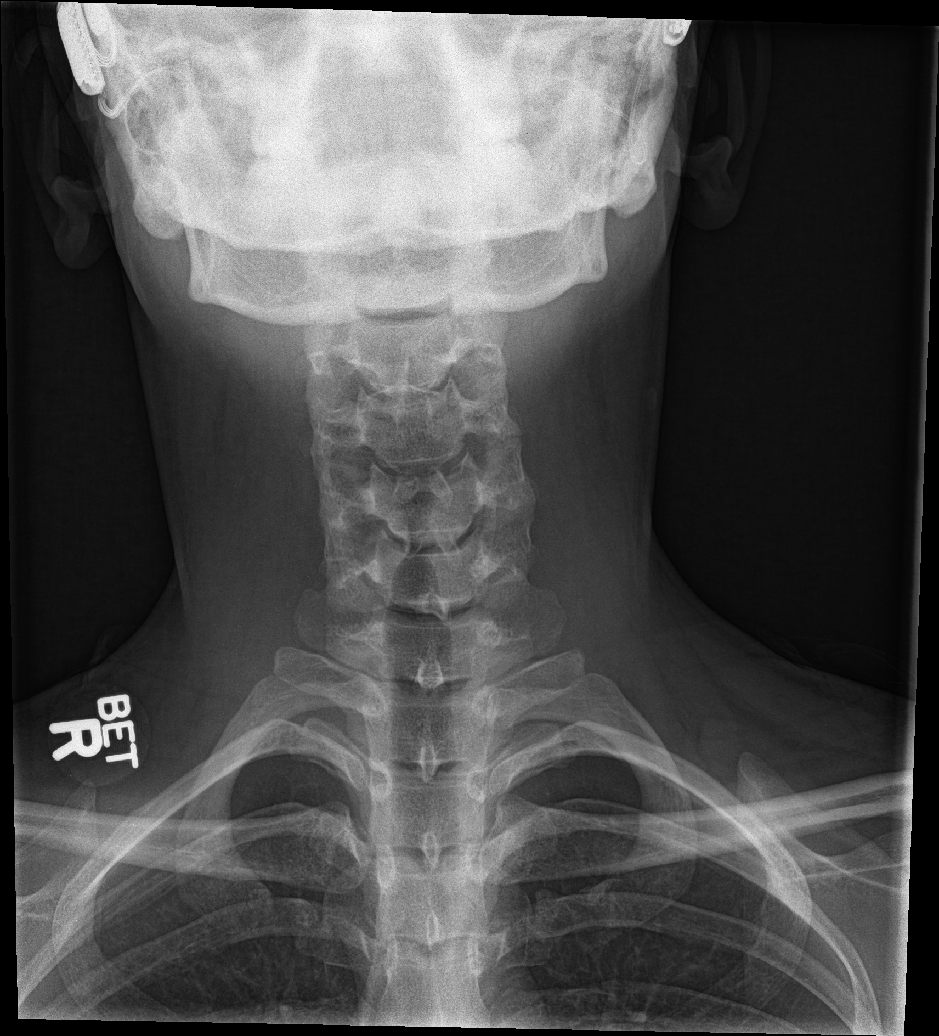

[c-spine open mouth]
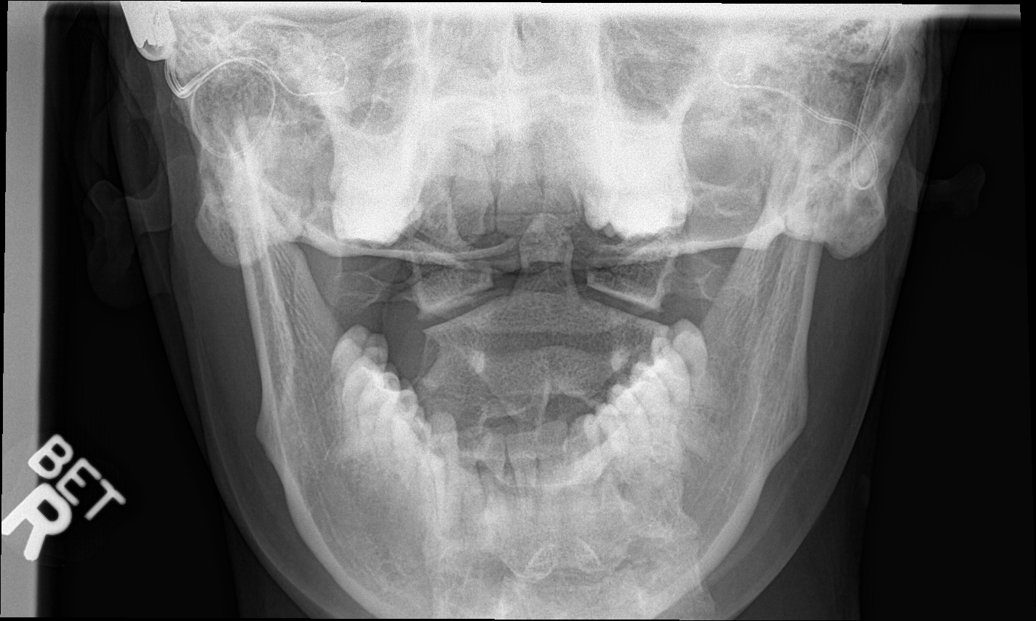

[5 of 5 positions shown; findings below may reference images not displayed]

FINDINGS: Straightening of the cervical spine. No acute fracture or
malalignment. Dens and lateral masses grossly within normal limits.
Prevertebral soft tissue thickness is normal.
IMPRESSION: No definite acute osseous abnormality

## 2017-01-21 ENCOUNTER — Encounter (HOSPITAL_COMMUNITY): Payer: Self-pay | Admitting: *Deleted

## 2017-01-21 DIAGNOSIS — F603 Borderline personality disorder: Secondary | ICD-10-CM | POA: Diagnosis not present

## 2017-01-21 DIAGNOSIS — F17210 Nicotine dependence, cigarettes, uncomplicated: Secondary | ICD-10-CM | POA: Diagnosis not present

## 2017-01-21 DIAGNOSIS — Z91040 Latex allergy status: Secondary | ICD-10-CM | POA: Insufficient documentation

## 2017-01-21 DIAGNOSIS — R109 Unspecified abdominal pain: Secondary | ICD-10-CM | POA: Diagnosis not present

## 2017-01-21 NOTE — ED Triage Notes (Signed)
Pt states that she is having some right sided flank pain. Pt says this feels just like symptoms from her interstitial cystitis.

## 2017-01-22 ENCOUNTER — Emergency Department (HOSPITAL_COMMUNITY)
Admission: EM | Admit: 2017-01-22 | Discharge: 2017-01-22 | Disposition: A | Discharge status: Home / Self Care | Payer: Medicaid Other | Attending: Emergency Medicine | Admitting: Emergency Medicine

## 2017-01-22 ENCOUNTER — Emergency Department (HOSPITAL_COMMUNITY): Payer: Medicaid Other

## 2017-01-22 DIAGNOSIS — R109 Unspecified abdominal pain: Secondary | ICD-10-CM

## 2017-01-22 LAB — PREGNANCY, URINE: PREG TEST UR: NEGATIVE

## 2017-01-22 LAB — COMPREHENSIVE METABOLIC PANEL
ALT: 15 U/L (ref 14–54)
AST: 19 U/L (ref 15–41)
Albumin: 4.3 g/dL (ref 3.5–5.0)
Alkaline Phosphatase: 78 U/L (ref 38–126)
Anion gap: 10 (ref 5–15)
BUN: 9 mg/dL (ref 6–20)
CALCIUM: 9.1 mg/dL (ref 8.9–10.3)
CO2: 20 mmol/L — ABNORMAL LOW (ref 22–32)
CREATININE: 0.55 mg/dL (ref 0.44–1.00)
Chloride: 109 mmol/L (ref 101–111)
Glucose, Bld: 94 mg/dL (ref 65–99)
Potassium: 3 mmol/L — ABNORMAL LOW (ref 3.5–5.1)
Sodium: 139 mmol/L (ref 135–145)
Total Bilirubin: 0.5 mg/dL (ref 0.3–1.2)
Total Protein: 7.2 g/dL (ref 6.5–8.1)

## 2017-01-22 LAB — URINALYSIS, ROUTINE W REFLEX MICROSCOPIC
BILIRUBIN URINE: NEGATIVE
Glucose, UA: NEGATIVE mg/dL
HGB URINE DIPSTICK: NEGATIVE
Ketones, ur: NEGATIVE mg/dL
Leukocytes, UA: NEGATIVE
Nitrite: NEGATIVE
Protein, ur: NEGATIVE mg/dL
Specific Gravity, Urine: 1.02 (ref 1.005–1.030)
pH: 6 (ref 5.0–8.0)

## 2017-01-22 LAB — CBC WITH DIFFERENTIAL/PLATELET
BASOS ABS: 0 10*3/uL (ref 0.0–0.1)
BASOS PCT: 0 %
EOS ABS: 0.1 10*3/uL (ref 0.0–0.7)
Eosinophils Relative: 1 %
HCT: 39.2 % (ref 36.0–46.0)
HEMOGLOBIN: 13.4 g/dL (ref 12.0–15.0)
Lymphocytes Relative: 42 %
Lymphs Abs: 3.8 10*3/uL (ref 0.7–4.0)
MCH: 32.2 pg (ref 26.0–34.0)
MCHC: 34.2 g/dL (ref 30.0–36.0)
MCV: 94.2 fL (ref 78.0–100.0)
Monocytes Absolute: 0.7 10*3/uL (ref 0.1–1.0)
Monocytes Relative: 7 %
NEUTROS PCT: 50 %
Neutro Abs: 4.6 10*3/uL (ref 1.7–7.7)
Platelets: 183 10*3/uL (ref 150–400)
RBC: 4.16 MIL/uL (ref 3.87–5.11)
RDW: 12.4 % (ref 11.5–15.5)
WBC: 9.2 10*3/uL (ref 4.0–10.5)

## 2017-01-22 MED ORDER — ONDANSETRON HCL 4 MG/2ML IJ SOLN
4.0000 mg | Freq: Once | INTRAMUSCULAR | Status: AC
  Administered 2017-01-22: 4 mg via INTRAVENOUS
  Filled 2017-01-22: qty 2

## 2017-01-22 MED ORDER — CYCLOBENZAPRINE HCL 5 MG PO TABS: 5.0000 mg | ORAL_TABLET | Freq: Three times a day (TID) | ORAL | 0 refills | Status: DC | PRN

## 2017-01-22 MED ORDER — SODIUM CHLORIDE 0.9 % IV BOLUS (SEPSIS)
1000.0000 mL | Freq: Once | INTRAVENOUS | Status: AC
  Administered 2017-01-22: 1000 mL via INTRAVENOUS

## 2017-01-22 MED ORDER — FENTANYL CITRATE (PF) 100 MCG/2ML IJ SOLN
50.0000 ug | Freq: Once | INTRAMUSCULAR | Status: AC
  Administered 2017-01-22: 50 ug via INTRAVENOUS
  Filled 2017-01-22: qty 2

## 2017-01-22 NOTE — ED Notes (Signed)
ED Provider at bedside. 

## 2017-01-22 NOTE — ED Notes (Signed)
Pt requesting pain medicine at the moment she states that her pain has went from one side of her back to now all over , states her pain is now a 8. States she is allergic to Tylenol it makes her throat close up.

## 2017-01-22 NOTE — ED Notes (Signed)
Patient transported to CT 

## 2017-01-22 NOTE — Discharge Instructions (Signed)
Use ice and heat for comfort. Take the muscle relaxer for your muscle pain in your back. Consider seeing a urologist to evaluate your interstitial cystitis.

## 2017-01-22 NOTE — ED Provider Notes (Signed)
[facility] EMERGENCY DEPARTMENT Provider Note   CSN: [id number] Arrival date & time: 01/21/17  2228  Time seen 03:42 AM   History   Chief Complaint Chief Complaint  Patient presents with  . Flank Pain    HPI Tammy Snyder is a 27 y.o. female.  HPI patient is hard of hearing but can read lips.  She states this morning meaning January 5 she started having right flank pain and states now she has pain in both flanks.  The pain is constant and stabbing.  Laughing makes the pain worse, nothing makes it feel better.  She has had nausea without vomiting.  She denies fever.  She states she has chronic suprapubic pain from interstitial cystitis although she states she is never seen a urologist.  She states she has chronic UTI symptoms and when I asked her about that she states "don't you know what interstitial cystitis is?" She states she has had chronic suprapubic pain since the birth of her 42-year-old child when she had a latex Foley catheter inserted and she had a latex allergy.  PCP Center, [facility] none  Past Medical History:  Diagnosis Date  . Bipolar 1 disorder (HCC)   . Borderline personality disorder (HCC)   . Interstitial cystitis   . Mental disorder    Borderline personalities disorder  . Oliguria 2013   cystoscopy, hydrodilation under anesthesia  . Polysubstance abuse (HCC)   . Vaginal Pap smear, abnormal     Patient Active Problem List   Diagnosis Date Noted  . Abnormal uterine bleeding 03/16/2011  . Low-lying placenta 07/29/2010    Past Surgical History:  Procedure Laterality Date  . Calibration of urethra with dilation of the urethra under anesthesia  04/04/2011   Dr. Rosezetta Schlatter  . CESAREAN SECTION  10/29/2010   Procedure: CESAREAN SECTION;  Surgeon: Lazaro Arms, MD;  Location: [facility];  Service: Gynecology;;  . COCHLEAR IMPLANT    . COCHLEAR IMPLANT    . cystoscopy, hydrodilation under anesthesia  04/04/2011    OB History    Gravida  Para Term Preterm AB Living   2 2 1 1  0 1   SAB TAB Ectopic Multiple Live Births   0 0 0 0 1       Home Medications    Prior to Admission medications   Medication Sig Start Date End Date Taking? Authorizing Provider  cyclobenzaprine (FLEXERIL) 5 MG tablet Take 1 tablet (5 mg total) by mouth 3 (three) times daily as needed. 01/22/17   Devoria Albe, MD  nitrofurantoin, macrocrystal-monohydrate, (MACROBID) 100 MG capsule Take 1 capsule (100 mg total) by mouth 2 (two) times daily. 06/27/16   Dione Booze, MD  oxyCODONE (ROXICODONE) 5 MG immediate release tablet Take 1 tablet (5 mg total) by mouth every 4 (four) hours as needed for severe pain. 06/27/16   Dione Booze, MD    Family History Family History  Problem Relation Age of Onset  . Hypertension Father   . Stroke Father     Social History Social History   Tobacco Use  . Smoking status: Current Some Day Smoker    Packs/day: 0.25    Years: 8.00    Pack years: 2.00    Types: Cigarettes  . Smokeless tobacco: Never Used  Substance Use Topics  . Alcohol use: Yes    Alcohol/week: 0.0 oz    Comment: occ  . Drug use: No     Allergies   Latex; Nsaids; and Tylenol [  acetaminophen]   Review of Systems Review of Systems  All other systems reviewed and are negative.    Physical Exam Updated Vital Signs BP 121/68   Pulse 70   Temp 98.2 F (36.8 C) (Oral)   Resp 18   LMP 12/24/2016   SpO2 100%   Physical Exam  Constitutional: She is oriented to person, place, and time. She appears well-developed and well-nourished.  Non-toxic appearance. She does not appear ill. She appears distressed.  Tearful holding a emesis bag  HENT:  Head: Normocephalic and atraumatic.  Right Ear: External ear normal.  Left Ear: External ear normal.  Nose: Nose normal. No mucosal edema or rhinorrhea.  Mouth/Throat: Oropharynx is clear and moist and mucous membranes are normal. No dental abscesses or uvula swelling.  Eyes: Conjunctivae and EOM are  normal. Pupils are equal, round, and reactive to light.  Neck: Normal range of motion and full passive range of motion without pain. Neck supple.  Cardiovascular: Normal rate, regular rhythm and normal heart sounds. Exam reveals no gallop and no friction rub.  No murmur heard. Pulmonary/Chest: Effort normal and breath sounds normal. No respiratory distress. She has no wheezes. She has no rhonchi. She has no rales. She exhibits no tenderness and no crepitus.  Abdominal: Soft. Normal appearance and bowel sounds are normal. She exhibits no distension. There is tenderness in the suprapubic area. There is no rebound and no guarding.  Genitourinary:  Genitourinary Comments: Bilateral CVA tenderness right worse than the left  Musculoskeletal: Normal range of motion. She exhibits no edema or tenderness.  Moves all extremities well.   Neurological: She is alert and oriented to person, place, and time. She has normal strength. No cranial nerve deficit.  Skin: Skin is warm, dry and intact. No rash noted. No erythema. No pallor.  Psychiatric: She has a normal mood and affect. Her speech is normal and behavior is normal. Her mood appears not anxious.  Nursing note and vitals reviewed.    ED Treatments / Results  Labs (all labs ordered are listed, but only abnormal results are displayed) Results for orders placed or performed during the hospital encounter of 01/22/17  Urinalysis, Routine w reflex microscopic- may I&O cath if menses  Result Value Ref Range   Color, Urine YELLOW YELLOW   APPearance HAZY (A) CLEAR   Specific Gravity, Urine 1.020 1.005 - 1.030   pH 6.0 5.0 - 8.0   Glucose, UA NEGATIVE NEGATIVE mg/dL   Hgb urine dipstick NEGATIVE NEGATIVE   Bilirubin Urine NEGATIVE NEGATIVE   Ketones, ur NEGATIVE NEGATIVE mg/dL   Protein, ur NEGATIVE NEGATIVE mg/dL   Nitrite NEGATIVE NEGATIVE   Leukocytes, UA NEGATIVE NEGATIVE  Pregnancy, urine  Result Value Ref Range   Preg Test, Ur NEGATIVE  NEGATIVE  Comprehensive metabolic panel  Result Value Ref Range   Sodium 139 135 - 145 mmol/L   Potassium 3.0 (L) 3.5 - 5.1 mmol/L   Chloride 109 101 - 111 mmol/L   CO2 20 (L) 22 - 32 mmol/L   Glucose, Bld 94 65 - 99 mg/dL   BUN 9 6 - 20 mg/dL   Creatinine, Ser 4.78 0.44 - 1.00 mg/dL   Calcium 9.1 8.9 - 29.5 mg/dL   Total Protein 7.2 6.5 - 8.1 g/dL   Albumin 4.3 3.5 - 5.0 g/dL   AST 19 15 - 41 U/L   ALT 15 14 - 54 U/L   Alkaline Phosphatase 78 38 - 126 U/L   Total Bilirubin 0.5 0.3 -  1.2 mg/dL   GFR calc non Af Amer >60 >60 mL/min   GFR calc Af Amer >60 >60 mL/min   Anion gap 10 5 - 15  CBC with Differential  Result Value Ref Range   WBC 9.2 4.0 - 10.5 K/uL   RBC 4.16 3.87 - 5.11 MIL/uL   Hemoglobin 13.4 12.0 - 15.0 g/dL   HCT 40.9 81.1 - 91.4 %   MCV 94.2 78.0 - 100.0 fL   MCH 32.2 26.0 - 34.0 pg   MCHC 34.2 30.0 - 36.0 g/dL   RDW 78.2 95.6 - 21.3 %   Platelets 183 150 - 400 K/uL   Neutrophils Relative % 50 %   Neutro Abs 4.6 1.7 - 7.7 K/uL   Lymphocytes Relative 42 %   Lymphs Abs 3.8 0.7 - 4.0 K/uL   Monocytes Relative 7 %   Monocytes Absolute 0.7 0.1 - 1.0 K/uL   Eosinophils Relative 1 %   Eosinophils Absolute 0.1 0.0 - 0.7 K/uL   Basophils Relative 0 %   Basophils Absolute 0.0 0.0 - 0.1 K/uL   Laboratory interpretation all normal except hypokalemia    EKG  EKG Interpretation None       Radiology Ct Renal Stone Study  Result Date: 01/22/2017 CLINICAL DATA:  Flank pain. EXAM: CT ABDOMEN AND PELVIS WITHOUT CONTRAST TECHNIQUE: Multidetector CT imaging of the abdomen and pelvis was performed following the standard protocol without IV contrast. COMPARISON:  Contrast enhanced CT 08/27/2015 FINDINGS: Lower chest: Lung bases are clear.  Mild breathing motion artifact. Hepatobiliary: Previous 15 mm enhancing lesion in the right lobe of the liver is not well seen on the current exam due to lack contrast. Gallbladder physiologically distended, no calcified stone. No  biliary dilatation. Pancreas: No ductal dilatation or inflammation. Spleen: Normal in size without focal abnormality. Adrenals/Urinary Tract: No adrenal nodule. No hydronephrosis. No urolithiasis. No definite perinephric edema, mild motion artifact through both kidneys. No focal renal lesion on noncontrast exam. Urinary bladder is nondistended and not well evaluated. Stomach/Bowel: Lack of enteric contrast and paucity of intra-abdominal fat limits bowel assessment. Stomach is within normal limits. Appendix appears normal. No evidence of bowel wall thickening, distention, or inflammatory changes. Vascular/Lymphatic: Normal caliber abdominal aorta. No enlarged abdominal or pelvic lymph nodes. Reproductive: Uterus and bilateral adnexa are unremarkable. Other: No free air, free fluid, or intra-abdominal fluid collection. Musculoskeletal: There are no acute or suspicious osseous abnormalities. IMPRESSION: 1. No renal stones or obstructive uropathy. 2. No acute abnormality or explanation for flank pain. Electronically Signed   By: Rubye Oaks M.D.   On: 01/22/2017 05:12    Procedures Procedures (including critical care time)  Medications Ordered in ED Medications  sodium chloride 0.9 % bolus 1,000 mL (1,000 mLs Intravenous New Bag/Given 01/22/17 0406)  fentaNYL (SUBLIMAZE) injection 50 mcg (50 mcg Intravenous Given 01/22/17 0405)  ondansetron (ZOFRAN) injection 4 mg (4 mg Intravenous Given 01/22/17 0406)     Initial Impression / Assessment and Plan / ED Course  I have reviewed the triage vital signs and the nursing notes.  Pertinent labs & imaging results that were available during my care of the patient were reviewed by me and considered in my medical decision making (see chart for details).     We discussed her urinalysis which was the only test I had back.  Patient got very upset that her urinalysis was normal.  Patient was given IV fluids IV pain medication.  CT scan was done to look for renal  stone.  Recheck at 5:30 AM when I enter the room patient is playing on her cell phone.  However when I enter the room she puts it down and starts looking painful.  We discussed her test results which are normal.  I feel she is having muscle pain from tensing her muscles from her chronic suprapubic pain.  She was discharged home on a muscle relaxer, she can use ice and heat for comfort.  She was given the number for [facility] for follow-up for her "interstitial cystitis".  Final Clinical Impressions(s) / ED Diagnoses   Final diagnoses:  Bilateral flank pain    ED Discharge Orders        Ordered    cyclobenzaprine (FLEXERIL) 5 MG tablet  3 times daily PRN     01/22/17 0614      Plan discharge  Devoria AlbeIva Wynston Romey, MD, Concha PyoFACEP    Elliette Seabolt, MD 01/22/17 [phone]

## 2017-03-09 ENCOUNTER — Emergency Department (HOSPITAL_COMMUNITY): Payer: Medicaid Other

## 2017-03-09 ENCOUNTER — Emergency Department (HOSPITAL_COMMUNITY)
Admission: EM | Admit: 2017-03-09 | Discharge: 2017-03-09 | Disposition: A | Discharge status: Home / Self Care | Payer: Medicaid Other | Attending: Emergency Medicine | Admitting: Emergency Medicine

## 2017-03-09 ENCOUNTER — Encounter (HOSPITAL_COMMUNITY): Payer: Self-pay | Admitting: Emergency Medicine

## 2017-03-09 DIAGNOSIS — Z79899 Other long term (current) drug therapy: Secondary | ICD-10-CM | POA: Diagnosis not present

## 2017-03-09 DIAGNOSIS — Y9289 Other specified places as the place of occurrence of the external cause: Secondary | ICD-10-CM | POA: Insufficient documentation

## 2017-03-09 DIAGNOSIS — S62522A Displaced fracture of distal phalanx of left thumb, initial encounter for closed fracture: Secondary | ICD-10-CM | POA: Diagnosis not present

## 2017-03-09 DIAGNOSIS — W230XXA Caught, crushed, jammed, or pinched between moving objects, initial encounter: Secondary | ICD-10-CM | POA: Insufficient documentation

## 2017-03-09 DIAGNOSIS — F17210 Nicotine dependence, cigarettes, uncomplicated: Secondary | ICD-10-CM | POA: Insufficient documentation

## 2017-03-09 DIAGNOSIS — S62502A Fracture of unspecified phalanx of left thumb, initial encounter for closed fracture: Secondary | ICD-10-CM

## 2017-03-09 DIAGNOSIS — S6992XA Unspecified injury of left wrist, hand and finger(s), initial encounter: Secondary | ICD-10-CM | POA: Diagnosis present

## 2017-03-09 DIAGNOSIS — Z23 Encounter for immunization: Secondary | ICD-10-CM | POA: Diagnosis not present

## 2017-03-09 DIAGNOSIS — Y999 Unspecified external cause status: Secondary | ICD-10-CM | POA: Diagnosis not present

## 2017-03-09 DIAGNOSIS — Z91040 Latex allergy status: Secondary | ICD-10-CM | POA: Diagnosis not present

## 2017-03-09 DIAGNOSIS — Y9389 Activity, other specified: Secondary | ICD-10-CM | POA: Insufficient documentation

## 2017-03-09 MED ORDER — CEPHALEXIN 500 MG PO CAPS: 500.0000 mg | ORAL_CAPSULE | Freq: Four times a day (QID) | ORAL | 0 refills | Status: DC

## 2017-03-09 MED ORDER — TETANUS-DIPHTH-ACELL PERTUSSIS 5-2.5-18.5 LF-MCG/0.5 IM SUSP
0.5000 mL | Freq: Once | INTRAMUSCULAR | Status: AC
  Administered 2017-03-09: 0.5 mL via INTRAMUSCULAR
  Filled 2017-03-09: qty 0.5

## 2017-03-09 MED ORDER — OXYCODONE HCL 5 MG PO TABS
5.0000 mg | ORAL_TABLET | ORAL | 0 refills | Status: DC | PRN
Start: 2017-03-09 — End: 2017-03-21

## 2017-03-09 MED ORDER — POVIDONE-IODINE 10 % EX SOLN
CUTANEOUS | Status: AC
  Filled 2017-03-09: qty 15

## 2017-03-09 MED ORDER — OXYCODONE HCL 5 MG PO TABS
5.0000 mg | ORAL_TABLET | Freq: Once | ORAL | Status: AC
  Administered 2017-03-09: 5 mg via ORAL
  Filled 2017-03-09: qty 1

## 2017-03-09 NOTE — ED Provider Notes (Signed)
[facility] EMERGENCY DEPARTMENT Provider Note   CSN: [id number] Arrival date & time: 03/09/17  1717     History   Chief Complaint Chief Complaint  Patient presents with  . Finger Injury    HPI Tammy Snyder is a 27 y.o. female.  HPI   Tammy Snyder is a 26 y.o. female who presents to the Emergency Department complaining of left thumb pain and swelling that began shortly before ER arrival.  Patient states that she accidentally shut the car door on her finger.  She states that she had to open the door to remove her finger.  She reports immediate pain and swelling to the distal joint of her thumb.  States she is unable to move her thumb due to pain.  She also describes a small amount of bleeding to the side of her thumb.  No injury to the nail.  She denies numbness  wrist pain, pain or discoloration of the thumb nail.  Last tetanus is unknown.  Past Medical History:  Diagnosis Date  . Bipolar 1 disorder (HCC)   . Borderline personality disorder (HCC)   . Interstitial cystitis   . Mental disorder    Borderline personalities disorder  . Oliguria 2013   cystoscopy, hydrodilation under anesthesia  . Polysubstance abuse (HCC)   . Vaginal Pap smear, abnormal     Patient Active Problem List   Diagnosis Date Noted  . Abnormal uterine bleeding 03/16/2011  . Low-lying placenta 07/29/2010    Past Surgical History:  Procedure Laterality Date  . Calibration of urethra with dilation of the urethra under anesthesia  04/04/2011   Dr. Rosezetta Schlatter  . CESAREAN SECTION  10/29/2010   Procedure: CESAREAN SECTION;  Surgeon: Lazaro Arms, MD;  Location: [facility];  Service: Gynecology;;  . COCHLEAR IMPLANT    . COCHLEAR IMPLANT    . cystoscopy, hydrodilation under anesthesia  04/04/2011    OB History    Gravida Para Term Preterm AB Living   2 2 1 1  0 1   SAB TAB Ectopic Multiple Live Births   0 0 0 0 1       Home Medications    Prior to Admission medications   Medication Sig Start  Date End Date Taking? Authorizing Provider  cyclobenzaprine (FLEXERIL) 5 MG tablet Take 1 tablet (5 mg total) by mouth 3 (three) times daily as needed. 01/22/17   Devoria Albe, MD  nitrofurantoin, macrocrystal-monohydrate, (MACROBID) 100 MG capsule Take 1 capsule (100 mg total) by mouth 2 (two) times daily. 06/27/16   Dione Booze, MD  oxyCODONE (ROXICODONE) 5 MG immediate release tablet Take 1 tablet (5 mg total) by mouth every 4 (four) hours as needed for severe pain. 06/27/16   Dione Booze, MD    Family History Family History  Problem Relation Age of Onset  . Hypertension Father   . Stroke Father     Social History Social History   Tobacco Use  . Smoking status: Current Some Day Smoker    Packs/day: 0.25    Years: 8.00    Pack years: 2.00    Types: Cigarettes  . Smokeless tobacco: Never Used  Substance Use Topics  . Alcohol use: Yes    Alcohol/week: 0.0 oz    Comment: occ  . Drug use: No     Allergies   Latex; Nsaids; and Tylenol [acetaminophen]   Review of Systems Review of Systems  Constitutional: Negative for chills and fever.  Musculoskeletal: Positive for arthralgias and joint swelling.  Skin: Negative for color change and wound.  Neurological: Negative for weakness and numbness.  All other systems reviewed and are negative.    Physical Exam Updated Vital Signs BP 128/77 (BP Location: Right Arm)   Pulse (!) 109   Temp 99.8 F (37.7 C) (Oral)   Resp 20   Ht 5' (1.524 m)   Wt 54.4 kg (120 lb)   LMP 03/04/2017 (Within Days)   SpO2 100%   BMI 23.44 kg/m   Physical Exam  Constitutional: She is oriented to person, place, and time. She appears well-developed and well-nourished.  Patient is tearful  HENT:  Head: Normocephalic and atraumatic.  Cardiovascular: Normal rate, regular rhythm and intact distal pulses.  Pulmonary/Chest: Effort normal and breath sounds normal.  Musculoskeletal: She exhibits edema and tenderness. She exhibits no deformity.  Focal  tenderness to palpation of the distal joint of the left thumb.  Mild edema.  No subungual hematoma, nail is intact and nontender.  No proximal tenderness or edema  Neurological: She is alert and oriented to person, place, and time. No sensory deficit. She exhibits normal muscle tone. Coordination normal.  Skin: Skin is warm and dry. Capillary refill takes less than 2 seconds.  Small abrasion to the soft tissue of the medial left thumb.  No active bleeding.    Nursing note and vitals reviewed.    ED Treatments / Results  Labs (all labs ordered are listed, but only abnormal results are displayed) Labs Reviewed - No data to display  EKG  EKG Interpretation None       Radiology Dg Finger Thumb Left  Result Date: 03/09/2017 CLINICAL DATA:  Crush injury. EXAM: LEFT THUMB 2+V COMPARISON:  No comparison studies available. FINDINGS: No gross fracture. No subluxation or dislocation. Lateral film shows possible tiny avulsion from the posterior head of the proximal phalanx. IMPRESSION: Possible tiny cortical avulsion injury from the posterior head of the proximal phalanx. No gross fracture or dislocation. The the Electronically Signed   By: Kennith CenterEric  Mansell M.D.   On: 03/09/2017 18:00    Procedures Procedures (including critical care time)  Medications Ordered in ED Medications  Tdap (BOOSTRIX) injection 0.5 mL (not administered)  povidone-iodine (BETADINE) 10 % external solution (not administered)  oxyCODONE (Oxy IR/ROXICODONE) immediate release tablet 5 mg (5 mg Oral Given 03/09/17 1735)     Initial Impression / Assessment and Plan / ED Course  I have reviewed the triage vital signs and the nursing notes.  Pertinent labs & imaging results that were available during my care of the patient were reviewed by me and considered in my medical decision making (see chart for details).     Td updated.  Thumb neurovascularly intact.  No injury of the nail or nailbed.  Abrasion to thumb without  open fracture.  Thumb spica splint applied.  Patient agrees to elevate, ice, and close orthopedic follow-up.  Final Clinical Impressions(s) / ED Diagnoses   Final diagnoses:  Closed avulsion fracture of phalanx of left thumb, initial encounter    ED Discharge Orders    None       Rosey Bathriplett, Joanna Hall, PA-C 03/10/17 2229    Rolland PorterJames, Mark, MD 03/13/17 2109

## 2017-03-09 NOTE — ED Triage Notes (Signed)
Patient states she slammed her left thumb in the car door.

## 2017-03-09 NOTE — Discharge Instructions (Signed)
Keep your thumb splinted.  Elevate your hand is much as possible.  Call the orthopedic doctor listed to arrange a follow-up appointment.

## 2017-03-09 NOTE — ED Notes (Signed)
Patient to xray.

## 2017-03-12 ENCOUNTER — Encounter (HOSPITAL_COMMUNITY): Payer: Self-pay | Admitting: Emergency Medicine

## 2017-03-12 ENCOUNTER — Other Ambulatory Visit: Payer: Self-pay

## 2017-03-12 ENCOUNTER — Emergency Department (HOSPITAL_COMMUNITY): Payer: Medicaid Other

## 2017-03-12 ENCOUNTER — Emergency Department (HOSPITAL_COMMUNITY)
Admission: EM | Admit: 2017-03-12 | Discharge: 2017-03-12 | Disposition: A | Discharge status: Home / Self Care | Payer: Medicaid Other | Attending: Emergency Medicine | Admitting: Emergency Medicine

## 2017-03-12 DIAGNOSIS — F17210 Nicotine dependence, cigarettes, uncomplicated: Secondary | ICD-10-CM | POA: Insufficient documentation

## 2017-03-12 DIAGNOSIS — M79645 Pain in left finger(s): Secondary | ICD-10-CM | POA: Insufficient documentation

## 2017-03-12 MED ORDER — MELOXICAM 15 MG PO TABS: 15.0000 mg | ORAL_TABLET | Freq: Every day | ORAL | 0 refills | Status: DC

## 2017-03-12 MED ORDER — KETOROLAC TROMETHAMINE 60 MG/2ML IM SOLN
60.0000 mg | Freq: Once | INTRAMUSCULAR | Status: AC
  Administered 2017-03-12: 60 mg via INTRAMUSCULAR
  Filled 2017-03-12: qty 2

## 2017-03-12 NOTE — ED Triage Notes (Signed)
Pt reports she shut her L thumb in a car door on Thursday, has known fx and is to follow up with ortho. States now her L wrist and hand are hurting and she has run out of pain medication. 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

## 2017-03-12 NOTE — Discharge Instructions (Addendum)
Continue to wear your brace all the time at home until you are seen by orthopedics.  Please call their office first thing tomorrow morning to schedule a follow-up appointment for this week.   Apply ice for 15-20 minutes as frequently as needed for pain control.  This will also help with swelling.  You can also apply topical analgesia to help with pain, such as lidocaine and capsaicin cream, which are available over-the-counter.  Use as directed on the label.  Take 1 tablet of meloxicam daily with food to help with pain.  Make sure to eat while taking meloxicam so that it does not upset your stomach.  Since your allergy to Tylenol and naproxen as a rash, you can take these medications with Benadryl to avoid developing a rash.  Please address continued issues with pain control with orthopedics.  If you develop new or worsening symptoms, such as if the finger becomes red and warm to the touch, if you develop a fever, or if you have another injury, you may return to the emergency department for reevaluation.
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
## 2017-03-14 ENCOUNTER — Telehealth: Payer: Self-pay | Admitting: Radiology

## 2017-03-14 NOTE — Telephone Encounter (Signed)
?   fx thumb You were on call when in ER on 03/09/17 Where do you want us to schedule ?

## 2017-03-14 NOTE — Telephone Encounter (Signed)
410 tues

## 2017-03-21 ENCOUNTER — Ambulatory Visit: Payer: Medicaid Other | Admitting: Orthopedic Surgery

## 2017-03-21 ENCOUNTER — Encounter: Payer: Self-pay | Admitting: Orthopedic Surgery

## 2017-03-21 VITALS — BP 118/70 | HR 76 | Ht 60.0 in | Wt 133.0 lb

## 2017-03-21 DIAGNOSIS — S60112A Contusion of left thumb with damage to nail, initial encounter: Secondary | ICD-10-CM

## 2017-03-21 MED ORDER — TRAMADOL HCL 50 MG PO TABS: 50.0000 mg | ORAL_TABLET | Freq: Four times a day (QID) | ORAL | 1 refills | Status: DC | PRN
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
## 2017-03-23 ENCOUNTER — Telehealth: Payer: Self-pay | Admitting: Orthopedic Surgery

## 2017-03-23 NOTE — Telephone Encounter (Signed)
Patient called, requests that a faxed report of her recent office visit be [facility], [place], [phone]#[phone] / fax# [phone].  Patient will need to sign release, and we can then fax the information.

## 2017-04-11 ENCOUNTER — Ambulatory Visit: Payer: Medicaid Other | Admitting: Orthopedic Surgery

## 2017-04-11 ENCOUNTER — Encounter: Payer: Self-pay | Admitting: Orthopedic Surgery

## 2017-04-24 ENCOUNTER — Ambulatory Visit: Payer: Self-pay | Admitting: Orthopedic Surgery

## 2017-04-28 ENCOUNTER — Encounter: Payer: Self-pay | Admitting: Orthopedic Surgery

## 2017-04-28 ENCOUNTER — Ambulatory Visit: Payer: Medicaid Other | Admitting: Orthopedic Surgery

## 2017-04-28 NOTE — Progress Notes (Deleted)
Progress Note   Patient ID: Tammy Snyder, female   DOB: 1990-02-06, 27 y.o.   MRN: [id number]  No chief complaint on file.   HPI   ROS No outpatient medications have been marked as taking for the 04/28/17 encounter (Appointment) with Vickki HearingHarrison, Marek Nghiem E, MD.    Allergies  Allergen Reactions  . Latex Rash  . Nsaids Rash  . Tylenol [Acetaminophen] Itching and Rash     There were no vitals taken for this visit.  Physical Exam   Medical decision-making No diagnosis found.    No orders of the defined types were placed in this encounter.    Fuller CanadaStanley Meaghann Choo, MD 04/28/2017 8:39 AM

## 2017-10-13 ENCOUNTER — Encounter: Payer: Self-pay | Admitting: Emergency Medicine

## 2017-10-13 ENCOUNTER — Emergency Department
Admission: EM | Admit: 2017-10-13 | Discharge: 2017-10-13 | Disposition: A | Discharge status: Home / Self Care | Payer: Medicaid Other | Attending: Emergency Medicine | Admitting: Emergency Medicine

## 2017-10-13 ENCOUNTER — Other Ambulatory Visit: Payer: Self-pay

## 2017-10-13 DIAGNOSIS — Z5321 Procedure and treatment not carried out due to patient leaving prior to being seen by health care provider: Secondary | ICD-10-CM | POA: Insufficient documentation

## 2017-10-13 DIAGNOSIS — R600 Localized edema: Secondary | ICD-10-CM | POA: Insufficient documentation

## 2017-10-13 MED ORDER — DIPHENHYDRAMINE HCL 25 MG PO CAPS
50.0000 mg | ORAL_CAPSULE | Freq: Once | ORAL | Status: AC
  Administered 2017-10-13: 50 mg via ORAL
  Filled 2017-10-13: qty 2

## 2017-10-13 MED ORDER — FAMOTIDINE 20 MG PO TABS
20.0000 mg | ORAL_TABLET | Freq: Once | ORAL | Status: AC
  Administered 2017-10-13: 20 mg via ORAL
  Filled 2017-10-13: qty 1

## 2017-10-13 MED ORDER — PREDNISONE 20 MG PO TABS
60.0000 mg | ORAL_TABLET | Freq: Once | ORAL | Status: AC
  Administered 2017-10-13: 60 mg via ORAL
  Filled 2017-10-13: qty 3

## 2017-10-13 NOTE — ED Triage Notes (Signed)
Pt presents to ED with slight swelling, itching, and redness to her face after using a charcoal facemask approx 4 hours ago. Did not take any benadryl at home. Pt denies feeling sob. States she "feels funny".

## 2017-10-13 NOTE — ED Notes (Signed)
Attempted to locate pt to recheck VS. Unable to locate pt at this time.

## 2017-10-30 ENCOUNTER — Emergency Department (HOSPITAL_COMMUNITY): Payer: No Typology Code available for payment source

## 2017-10-30 ENCOUNTER — Emergency Department (HOSPITAL_COMMUNITY)
Admission: EM | Admit: 2017-10-30 | Discharge: 2017-10-30 | Disposition: A | Discharge status: Home / Self Care | Payer: No Typology Code available for payment source | Attending: Emergency Medicine | Admitting: Emergency Medicine

## 2017-10-30 ENCOUNTER — Other Ambulatory Visit: Payer: Self-pay

## 2017-10-30 ENCOUNTER — Encounter (HOSPITAL_COMMUNITY): Payer: Self-pay | Admitting: Emergency Medicine

## 2017-10-30 DIAGNOSIS — Y939 Activity, unspecified: Secondary | ICD-10-CM | POA: Insufficient documentation

## 2017-10-30 DIAGNOSIS — Y92410 Unspecified street and highway as the place of occurrence of the external cause: Secondary | ICD-10-CM | POA: Diagnosis not present

## 2017-10-30 DIAGNOSIS — F17210 Nicotine dependence, cigarettes, uncomplicated: Secondary | ICD-10-CM | POA: Diagnosis not present

## 2017-10-30 DIAGNOSIS — S0990XA Unspecified injury of head, initial encounter: Secondary | ICD-10-CM | POA: Diagnosis present

## 2017-10-30 DIAGNOSIS — S060X9A Concussion with loss of consciousness of unspecified duration, initial encounter: Secondary | ICD-10-CM

## 2017-10-30 DIAGNOSIS — Y998 Other external cause status: Secondary | ICD-10-CM | POA: Diagnosis not present

## 2017-10-30 DIAGNOSIS — Z91040 Latex allergy status: Secondary | ICD-10-CM | POA: Diagnosis not present

## 2017-10-30 LAB — POC URINE PREG, ED: Preg Test, Ur: NEGATIVE

## 2017-10-30 MED ORDER — CYCLOBENZAPRINE HCL 10 MG PO TABS
5.0000 mg | ORAL_TABLET | Freq: Once | ORAL | Status: AC
  Administered 2017-10-30: 5 mg via ORAL
  Filled 2017-10-30: qty 1

## 2017-10-30 MED ORDER — ACETAMINOPHEN 500 MG PO TABS
1000.0000 mg | ORAL_TABLET | Freq: Once | ORAL | Status: DC
  Filled 2017-10-30: qty 2

## 2017-10-30 MED ORDER — FENTANYL CITRATE (PF) 100 MCG/2ML IJ SOLN
50.0000 ug | Freq: Once | INTRAMUSCULAR | Status: AC
  Administered 2017-10-30: 50 ug via INTRAMUSCULAR
  Filled 2017-10-30: qty 2

## 2017-10-30 MED ORDER — KETOROLAC TROMETHAMINE 60 MG/2ML IM SOLN
60.0000 mg | Freq: Once | INTRAMUSCULAR | Status: AC
  Administered 2017-10-30: 60 mg via INTRAMUSCULAR
  Filled 2017-10-30: qty 2
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
## 2017-10-30 NOTE — ED Notes (Addendum)
Went in to round on patient. Pt noted to have high anxiety and tearful with intermittent chest discomfort. Pt noted to be hyperventilating. Pt consoled. nad noted. Delay explained, plan of care discussed, vital signs obtained and WDL.

## 2017-10-30 NOTE — Discharge Instructions (Addendum)
Concussion is diagnosed anytime you have severe headache or neurologic changes after trauma to her head.  The best treatment for this is to take 1 to 2 days and stay in a cold quiet room and stay well-hydrated with water.  Take Tylenol for the headache.  Return to normal activity when you can do them without headaches.  If you experience symptoms longer than 3 to 5 days follow-up with your primary doctor for further evaluation. I did not find any injuries based on my physical exam or x-rays.  I do expect you to have more muscular soreness over the next 12 to 24 hours.  If you have anything is more than what to expect after a difficult workout this could be a sign of a missed injury please return to the emergency department.
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
## 2018-01-17 NOTE — L&D Delivery Note (Signed)
Delivery Note successful VBAC At  2330 a viable and healthy female "Sonya Lillianna"was delivered via  (Presentation:OA ;  ).  APGAR: 9, ; weight pending skin-to-skin  .   Placenta status: delivered intact with 3 vessel  Cord:  with the following complications: none  Anesthesia:  epidural Episiotomy:  none Lacerations:  none Suture Repair: NA Est. Blood Loss (mL):  100  Mom to postpartum.  Baby to Couplet care / Skin to Skin.  [name] 10/05/2018, 11:40 PM

## 2018-01-29 ENCOUNTER — Emergency Department (HOSPITAL_COMMUNITY): Payer: Medicaid Other

## 2018-01-29 ENCOUNTER — Encounter (HOSPITAL_COMMUNITY): Payer: Self-pay | Admitting: Emergency Medicine

## 2018-01-29 ENCOUNTER — Emergency Department (HOSPITAL_COMMUNITY)
Admission: EM | Admit: 2018-01-29 | Discharge: 2018-01-29 | Disposition: A | Discharge status: Home / Self Care | Payer: Medicaid Other | Attending: Emergency Medicine | Admitting: Emergency Medicine

## 2018-01-29 DIAGNOSIS — S0083XA Contusion of other part of head, initial encounter: Secondary | ICD-10-CM | POA: Diagnosis not present

## 2018-01-29 DIAGNOSIS — F1910 Other psychoactive substance abuse, uncomplicated: Secondary | ICD-10-CM | POA: Insufficient documentation

## 2018-01-29 DIAGNOSIS — Y998 Other external cause status: Secondary | ICD-10-CM | POA: Diagnosis not present

## 2018-01-29 DIAGNOSIS — Y92009 Unspecified place in unspecified non-institutional (private) residence as the place of occurrence of the external cause: Secondary | ICD-10-CM | POA: Diagnosis not present

## 2018-01-29 DIAGNOSIS — F319 Bipolar disorder, unspecified: Secondary | ICD-10-CM | POA: Insufficient documentation

## 2018-01-29 DIAGNOSIS — Y9389 Activity, other specified: Secondary | ICD-10-CM | POA: Insufficient documentation

## 2018-01-29 DIAGNOSIS — H1089 Other conjunctivitis: Secondary | ICD-10-CM | POA: Diagnosis not present

## 2018-01-29 DIAGNOSIS — F17210 Nicotine dependence, cigarettes, uncomplicated: Secondary | ICD-10-CM | POA: Insufficient documentation

## 2018-01-29 DIAGNOSIS — S0993XA Unspecified injury of face, initial encounter: Secondary | ICD-10-CM | POA: Diagnosis present

## 2018-01-29 LAB — POC URINE PREG, ED: PREG TEST UR: NEGATIVE

## 2018-01-29 MED ORDER — FLUORESCEIN SODIUM 1 MG OP STRP
1.0000 | ORAL_STRIP | Freq: Once | OPHTHALMIC | Status: AC
  Administered 2018-01-29: 1 via OPHTHALMIC
  Filled 2018-01-29: qty 1

## 2018-01-29 MED ORDER — TRAMADOL HCL 50 MG PO TABS: 50.0000 mg | ORAL_TABLET | Freq: Four times a day (QID) | ORAL | 0 refills | Status: DC | PRN

## 2018-01-29 MED ORDER — TETRACAINE HCL 0.5 % OP SOLN
1.0000 [drp] | Freq: Once | OPHTHALMIC | Status: AC
  Administered 2018-01-29: 1 [drp] via OPHTHALMIC
  Filled 2018-01-29: qty 4
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
## 2018-01-29 NOTE — Discharge Instructions (Addendum)
As discussed your CT imaging tonight is negative for any bony fractures of your face or nose.  Your eye exam is also reassuring that there is no retained glass foreign body in your eye.  You may find ice packs helpful for pain and swelling for the next 24 hours as much as is comfortable to use.  After that you can also use warm compresses as your injuries heal.  You may take the medication prescribed for pain relief, this will make you drowsy so do not drive within 4 hours of taking this medication.
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
## 2018-03-06 ENCOUNTER — Other Ambulatory Visit (INDEPENDENT_AMBULATORY_CARE_PROVIDER_SITE_OTHER): Payer: Medicaid Other

## 2018-03-06 ENCOUNTER — Ambulatory Visit (INDEPENDENT_AMBULATORY_CARE_PROVIDER_SITE_OTHER): Payer: Medicaid Other | Admitting: Certified Nurse Midwife

## 2018-03-06 ENCOUNTER — Encounter: Payer: Self-pay | Admitting: Certified Nurse Midwife

## 2018-03-06 VITALS — BP 97/65 | HR 80 | Ht 60.0 in | Wt 124.4 lb

## 2018-03-06 DIAGNOSIS — N912 Amenorrhea, unspecified: Secondary | ICD-10-CM

## 2018-03-06 DIAGNOSIS — Z3201 Encounter for pregnancy test, result positive: Secondary | ICD-10-CM | POA: Diagnosis not present

## 2018-03-06 DIAGNOSIS — H9190 Unspecified hearing loss, unspecified ear: Secondary | ICD-10-CM | POA: Insufficient documentation

## 2018-03-06 DIAGNOSIS — Z3687 Encounter for antenatal screening for uncertain dates: Secondary | ICD-10-CM

## 2018-03-06 DIAGNOSIS — Z98891 History of uterine scar from previous surgery: Secondary | ICD-10-CM | POA: Insufficient documentation

## 2018-03-06 LAB — POCT URINE PREGNANCY: Preg Test, Ur: POSITIVE — AB

## 2018-03-06 NOTE — Progress Notes (Signed)
Subjective:    Tammy Snyder is a 28 y.o. female who presents for evaluation of amenorrhea. She believes she could be pregnant. Pregnancy is desired. Sexual Activity: single partner, contraception: none. Current symptoms also include: none. Last period was unknown.   No LMP recorded. The following portions of the patient's history were reviewed and updated as appropriate: allergies, current medications, past family history, past medical history, past social history, past surgical history and problem list.  Review of Systems Pertinent items are noted in HPI.     Objective:    There were no vitals taken for this visit. General: alert, cooperative, appears stated age and no acute distress    Lab Review Urine HCG: positive    Patient Name: Tammy Snyder DOB: 10/11/1990 MRN: [id number]  ULTRASOUND REPORT  Location: [facility] Date of Service: 03/06/2018   Indications:dating Findings:  Mason Jim intrauterine pregnancy is visualized with a CRL consistent with [redacted]w[redacted]d gestation, giving an (U/S) EDD of 10/21/18.   FHR: 136 BPM CRL measurement: 11.2 mm Yolk sac is visualized and appears normal and early anatomy is normal. Amnion: visualized and appears normal   Right Ovary is normal in appearance. Left Ovary is normal appearance. Corpus luteal cyst:  is not visualized Survey of the adnexa demonstrates no adnexal masses. There is no free peritoneal fluid in the cul de sac.  Impression: 1. [redacted]w[redacted]d Viable Singleton Intrauterine pregnancy by U/S. 2. (U/S) EDD is 10/21/18.  Recommendations: 1.Clinical correlation with the patient's History and Physical Exam. 2  Abeer Alsammarraie, RDMS  Assessment:    Absence of menstruation.     Plan:   Positive: EDC: 10/21/18. Briefly discussed pre-natal care options.Discussed midwifery or MD care. Pt request to be midwife pt . She has significantly history of hearing loss, previous c/s for 9lb baby, and VBAC x 1. She would like to VBAC.   Encouraged well-balanced diet, plenty of rest when needed, pre-natal vitamins daily and walking for exercise. Discussed self-help for nausea, avoiding OTC medications until consulting provider or pharmacist, other than Tylenol as needed, minimal caffeine (1-2 cups daily). I  and avoiding alcohol. nformation given on genetic screeing options.She will schedule her initial nurse visit @ 10 wks and her NOB visit I@ 12 wks.   Doreene Burke, CNM

## 2018-03-06 NOTE — Patient Instructions (Signed)
Common Medications Safe in Pregnancy  Acne:      Constipation:  Benzoyl Peroxide     Colace  Clindamycin      Dulcolax Suppository  Topica Erythromycin     Fibercon  Salicylic Acid      Metamucil         Miralax AVOID:        Senakot   Accutane    Cough:  Retin-A       Cough Drops  Tetracycline      Phenergan w/ Codeine if Rx  Minocycline      Robitussin (Plain & DM)  Antibiotics:     Crabs/Lice:  Ceclor       RID  Cephalosporins    AVOID:  E-Mycins      Kwell  Keflex  Macrobid/Macrodantin   Diarrhea:  Penicillin      Kao-Pectate  Zithromax      Imodium AD         PUSH FLUIDS AVOID:       Cipro     Fever:  Tetracycline      Tylenol (Regular or Extra  Minocycline       Strength)  Levaquin      Extra Strength-Do not          Exceed 8 tabs/24 hrs Caffeine:        <200mg/day (equiv. To 1 cup of coffee or  approx. 3 12 oz sodas)         Gas: Cold/Hayfever:       Gas-X  Benadryl      Mylicon  Claritin       Phazyme  **Claritin-D        Chlor-Trimeton    Headaches:  Dimetapp      ASA-Free Excedrin  Drixoral-Non-Drowsy     Cold Compress  Mucinex (Guaifenasin)     Tylenol (Regular or Extra  Sudafed/Sudafed-12 Hour     Strength)  **Sudafed PE Pseudoephedrine   Tylenol Cold & Sinus     Vicks Vapor Rub  Zyrtec  **AVOID if Problems With Blood Pressure         Heartburn: Avoid lying down for at least 1 hour after meals  Aciphex      Maalox     Rash:  Milk of Magnesia     Benadryl    Mylanta       1% Hydrocortisone Cream  Pepcid  Pepcid Complete   Sleep Aids:  Prevacid      Ambien   Prilosec       Benadryl  Rolaids       Chamomile Tea  Tums (Limit 4/day)     Unisom  Zantac       Tylenol PM         Warm milk-add vanilla or  Hemorrhoids:       Sugar for taste  Anusol/Anusol H.C.  (RX: Analapram 2.5%)  Sugar Substitutes:  Hydrocortisone OTC     Ok in moderation  Preparation H      Tucks        Vaseline lotion applied to tissue with  wiping    Herpes:     Throat:  Acyclovir      Oragel  Famvir  Valtrex     Vaccines:         Flu Shot Leg Cramps:       *Gardasil  Benadryl      Hepatitis A         Hepatitis B Nasal Spray:         Pneumovax  Saline Nasal Spray     Polio Booster         Tetanus Nausea:       Tuberculosis test or PPD  Vitamin B6 25 mg TID   AVOID:    Dramamine      *Gardasil  Emetrol       Live Poliovirus  Ginger Root 250 mg QID    MMR (measles, mumps &  High Complex Carbs @ Bedtime    rebella)  Sea Bands-Accupressure    Varicella (Chickenpox)  Unisom 1/2 tab TID     *No known complications           If received before Pain:         Known pregnancy;   Darvocet       Resume series after  Lortab        Delivery  Percocet    Yeast:   Tramadol      Femstat  Tylenol 3      Gyne-lotrimin  Ultram       Monistat  Vicodin           MISC:         All Sunscreens           Hair Coloring/highlights          Insect Repellant's          (Including DEET)         Mystic Tans Prenatal Care Prenatal care is health care during pregnancy. It helps you and your unborn baby (fetus) stay as healthy as possible. Prenatal care may be provided by a midwife, a family practice health care provider, or a childbirth and pregnancy specialist (obstetrician). How does this affect me? During pregnancy, you will be closely monitored for any new conditions that might develop. To lower your risk of pregnancy complications, you and your health care provider will talk about any underlying conditions you have. How does this affect my baby? Early and consistent prenatal care increases the chance that your baby will be healthy during pregnancy. Prenatal care lowers the risk that your baby will be:  Born early (prematurely).  Smaller than expected at birth (small for gestational age). What can I expect at the first prenatal care visit? Your first prenatal care visit will likely be the longest. You should schedule your first prenatal care visit as soon as  you know that you are pregnant. Your first visit is a good time to talk about any questions or concerns you have about pregnancy. At your visit, you and your health care provider will talk about:  Your medical history, including: ? Any past pregnancies. ? Your family's medical history. ? The baby's father's medical history. ? Any long-term (chronic) health conditions you have and how you manage them. ? Any surgeries or procedures you have had. ? Any current over-the-counter or prescription medicines, herbs, or supplements you are taking.  Other factors that could pose a risk to your baby, including:  Your home setting and your stress levels, including: ? Exposure to abuse or violence. ? Household financial strain. ? Mental health conditions you have.  Your daily health habits, including diet and exercise. Your health care provider will also:  Measure your weight, height, and blood pressure.  Do a physical exam, including a pelvic and breast exam.  Perform blood tests and urine tests to check for: ? Urinary tract infection. ? Sexually transmitted infections (STIs). ? Low iron levels in your blood (anemia). ? Blood   type and certain proteins on red blood cells (Rh antibodies). ? Infections and immunity to viruses, such as hepatitis B and rubella. ? HIV (human immunodeficiency virus).  Do an ultrasound to confirm your baby's growth and development and to help predict your estimated due date (EDD). This ultrasound is done with a probe that is inserted into the vagina (transvaginal ultrasound).  Discuss your options for genetic screening.  Give you information about how to keep yourself and your baby healthy, including: ? Nutrition and taking vitamins. ? Physical activity. ? How to manage pregnancy symptoms such as nausea and vomiting (morning sickness). ? Infections and substances that may be harmful to your baby and how to avoid them. ? Food safety. ? Dental care. ? Working. ?  Travel. ? Warning signs to watch for and when to call your health care provider. How often will I have prenatal care visits? After your first prenatal care visit, you will have regular visits throughout your pregnancy. The visit schedule is often as follows:  Up to week 28 of pregnancy: once every 4 weeks.  28-36 weeks: once every 2 weeks.  After 36 weeks: every week until delivery. Some women may have visits more or less often depending on any underlying health conditions and the health of the baby. Keep all follow-up and prenatal care visits as told by your health care provider. This is important. What happens during routine prenatal care visits? Your health care provider will:  Measure your weight and blood pressure.  Check for fetal heart sounds.  Measure the height of your uterus in your abdomen (fundal height). This may be measured starting around week 20 of pregnancy.  Check the position of your baby inside your uterus.  Ask questions about your diet, sleeping patterns, and whether you can feel the baby move.  Review warning signs to watch for and signs of labor.  Ask about any pregnancy symptoms you are having and how you are dealing with them. Symptoms may include: ? Headaches. ? Nausea and vomiting. ? Vaginal discharge. ? Swelling. ? Fatigue. ? Constipation. ? Any discomfort, including back or pelvic pain. Make a list of questions to ask your health care provider at your routine visits. What tests might I have during prenatal care visits? You may have blood, urine, and imaging tests throughout your pregnancy, such as:  Urine tests to check for glucose, protein, or signs of infection.  Glucose tests to check for a form of diabetes that can develop during pregnancy (gestational diabetes mellitus). This is usually done around week 24 of pregnancy.  An ultrasound to check your baby's growth and development and to check for birth defects. This is usually done around  week 20 of pregnancy.  A test to check for group B strep (GBS) infection. This is usually done around week 36 of pregnancy.  Genetic testing. This may include blood or imaging tests, such as an ultrasound. Some genetic tests are done during the first trimester and some are done during the second trimester. What else can I expect during prenatal care visits? Your health care provider may recommend getting certain vaccines during pregnancy. These may include:  A yearly flu shot (annual influenza vaccine). This is especially important if you will be pregnant during flu season.  Tdap (tetanus, diphtheria, pertussis) vaccine. Getting this vaccine during pregnancy can protect your baby from whooping cough (pertussis) after birth. This vaccine may be recommended between weeks 27 and 36 of pregnancy. Later in your pregnancy, your health care   provider may give you information about:  Childbirth and breastfeeding classes.  Choosing a health care provider for your baby.  Umbilical cord banking.  Breastfeeding.  Birth control after your baby is born.  The hospital labor and delivery unit and how to tour it.  Registering at the hospital before you go into labor. Where to find more information  Office on Women's Health: womenshealth.gov  American Pregnancy Association: americanpregnancy.org  March of Dimes: marchofdimes.org Summary  Prenatal care helps you and your baby stay as healthy as possible during pregnancy.  Your first prenatal care visit will most likely be the longest.  You will have visits and tests throughout your pregnancy to monitor your health and your baby's health.  Bring a list of questions to your visits to ask your health care provider.  Make sure to keep all follow-up and prenatal care visits with your health care provider. This information is not intended to replace advice given to you by your health care provider. Make sure you discuss any questions you have with  your health care provider. Document Released: 01/06/2003 Document Revised: 01/02/2017 Document Reviewed: 01/02/2017 Elsevier Interactive Patient Education  2019 Elsevier Inc.  

## 2018-03-13 ENCOUNTER — Other Ambulatory Visit: Payer: Medicaid Other

## 2018-03-19 ENCOUNTER — Ambulatory Visit (INDEPENDENT_AMBULATORY_CARE_PROVIDER_SITE_OTHER): Payer: Medicaid Other | Admitting: Certified Nurse Midwife

## 2018-03-19 VITALS — BP 108/45 | HR 75 | Ht 60.0 in | Wt 132.2 lb

## 2018-03-19 DIAGNOSIS — Z3401 Encounter for supervision of normal first pregnancy, first trimester: Secondary | ICD-10-CM

## 2018-03-19 NOTE — Progress Notes (Signed)
Tammy Snyder presents for NOB nurse interview visit. Pregnancy confirmation done ____2/18/2020______. G3. P2002. Pregnancy education material explained and given. ___1_ cats in home. NOB labs ordered.  HIV labs and drug screen were explained and ordered. PNV encouraged. Genetic screening options discussed. Genetic testing:Unsure. Patient may discuss with the provider. Patient to follow up with provider 04/06/2018 for NOB physical. All questions answered.

## 2018-03-20 LAB — URINALYSIS, ROUTINE W REFLEX MICROSCOPIC
Bilirubin, UA: NEGATIVE
Glucose, UA: NEGATIVE
KETONES UA: NEGATIVE
Leukocytes, UA: NEGATIVE
Nitrite, UA: NEGATIVE
Protein, UA: NEGATIVE
RBC, UA: NEGATIVE
Specific Gravity, UA: 1.011 (ref 1.005–1.030)
Urobilinogen, Ur: 0.2 mg/dL (ref 0.2–1.0)
pH, UA: 7 (ref 5.0–7.5)

## 2018-03-20 LAB — VARICELLA ZOSTER ANTIBODY, IGG: Varicella zoster IgG: 1198 index (ref >165)

## 2018-03-20 LAB — PARVOVIRUS B19 ANTIBODY, IGG AND IGM
Parvovirus B19 IgG: 7.6 index — ABNORMAL HIGH (ref 0.0–0.8)
Parvovirus B19 IgM: 0.1 index (ref 0.0–0.8)

## 2018-03-20 LAB — RPR: RPR: NONREACTIVE

## 2018-03-20 LAB — HIV ANTIBODY (ROUTINE TESTING W REFLEX): HIV Screen 4th Generation wRfx: NONREACTIVE

## 2018-03-20 LAB — HEPATITIS B SURFACE ANTIGEN: Hepatitis B Surface Ag: NEGATIVE

## 2018-03-20 LAB — RUBELLA SCREEN: RUBELLA: 1.56 {index} (ref >0.99)

## 2018-03-20 LAB — TOXOPLASMA ANTIBODIES- IGG AND  IGM: Toxoplasma Antibody- IgM: 5.5 AU/mL (ref 0.0–7.9)

## 2018-03-20 LAB — ABO AND RH: RH TYPE: POSITIVE

## 2018-03-20 LAB — ANTIBODY SCREEN: Antibody Screen: NEGATIVE

## 2018-03-21 ENCOUNTER — Telehealth: Payer: Self-pay

## 2018-03-21 ENCOUNTER — Other Ambulatory Visit: Payer: Self-pay | Admitting: Certified Nurse Midwife

## 2018-03-21 LAB — URINE CULTURE, OB REFLEX: Organism ID, Bacteria: NO GROWTH

## 2018-03-21 LAB — GC/CHLAMYDIA PROBE AMP
Chlamydia trachomatis, NAA: POSITIVE — AB
Neisseria gonorrhoeae by PCR: NEGATIVE

## 2018-03-21 LAB — CULTURE, OB URINE

## 2018-03-21 MED ORDER — AZITHROMYCIN 500 MG PO TABS: 1000.0000 mg | ORAL_TABLET | Freq: Once | ORAL | 1 refills | Status: AC

## 2018-03-21 NOTE — Progress Notes (Signed)
Orders placed for chlamydia treatment. Pt notified via nurse phone call.    Doreene Burke, CNM

## 2018-03-21 NOTE — Telephone Encounter (Signed)
Voicemail message sent for pt to please return my call re: test results

## 2018-03-22 ENCOUNTER — Telehealth: Payer: Self-pay
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
## 2018-03-23 ENCOUNTER — Other Ambulatory Visit: Payer: Self-pay | Admitting: Certified Nurse Midwife

## 2018-03-23 LAB — MONITOR DRUG PROFILE 14(MW)
Amphetamine Scrn, Ur: NEGATIVE ng/mL
BARBITURATE SCREEN URINE: NEGATIVE ng/mL
BENZODIAZEPINE SCREEN, URINE: NEGATIVE ng/mL
Buprenorphine, Urine: NEGATIVE ng/mL
Cocaine (Metab) Scrn, Ur: NEGATIVE ng/mL
Creatinine(Crt), U: 45.9 mg/dL (ref 20.0–300.0)
Fentanyl, Urine: NEGATIVE pg/mL
Meperidine Screen, Urine: NEGATIVE ng/mL
Methadone Screen, Urine: NEGATIVE ng/mL
OXYCODONE+OXYMORPHONE UR QL SCN: NEGATIVE ng/mL
Opiate Scrn, Ur: NEGATIVE ng/mL
Ph of Urine: 6.7 (ref 4.5–8.9)
Phencyclidine Qn, Ur: NEGATIVE ng/mL
Propoxyphene Scrn, Ur: NEGATIVE ng/mL
SPECIFIC GRAVITY: 1.012
Tramadol Screen, Urine: NEGATIVE ng/mL

## 2018-03-23 LAB — CANNABINOID (GC/MS), URINE
CANNABINOID UR: POSITIVE — AB
CARBOXY THC UR: 394 ng/mL

## 2018-03-23 LAB — NICOTINE SCREEN, URINE: Cotinine Ql Scrn, Ur: NEGATIVE ng/mL

## 2018-03-23 MED ORDER — AZITHROMYCIN 500 MG PO TABS: 1000.0000 mg | ORAL_TABLET | Freq: Once | ORAL | 1 refills | Status: AC

## 2018-03-23 NOTE — Progress Notes (Signed)
Unable to reach letter sent

## 2018-03-23 NOTE — Progress Notes (Signed)
Chlamydia positive. Orders placed for treatment.   Doreene Burke, CNM

## 2018-03-26 ENCOUNTER — Telehealth: Payer: Self-pay

## 2018-03-26 NOTE — Telephone Encounter (Signed)
Unable to reach letter sent 03/23/18

## 2018-04-06 ENCOUNTER — Encounter: Payer: Self-pay | Admitting: Certified Nurse Midwife

## 2018-04-06 ENCOUNTER — Other Ambulatory Visit: Payer: Self-pay

## 2018-04-06 ENCOUNTER — Ambulatory Visit (INDEPENDENT_AMBULATORY_CARE_PROVIDER_SITE_OTHER): Payer: Medicaid Other | Admitting: Certified Nurse Midwife

## 2018-04-06 ENCOUNTER — Other Ambulatory Visit (HOSPITAL_COMMUNITY)
Admission: RE | Admit: 2018-04-06 | Discharge: 2018-04-06 | Disposition: A | Discharge status: Home / Self Care | Payer: Medicaid Other | Source: Ambulatory Visit | Attending: Certified Nurse Midwife | Admitting: Certified Nurse Midwife

## 2018-04-06 VITALS — BP 97/57 | HR 78 | Wt 128.2 lb

## 2018-04-06 DIAGNOSIS — Z3401 Encounter for supervision of normal first pregnancy, first trimester: Secondary | ICD-10-CM | POA: Insufficient documentation

## 2018-04-06 LAB — POCT URINALYSIS DIPSTICK OB
BILIRUBIN UA: NEGATIVE
Blood, UA: NEGATIVE
GLUCOSE, UA: NEGATIVE
Ketones, UA: NEGATIVE
Leukocytes, UA: NEGATIVE
Nitrite, UA: NEGATIVE
POC,PROTEIN,UA: NEGATIVE
Spec Grav, UA: 1.02 (ref 1.010–1.025)
Urobilinogen, UA: 0.2 E.U./dL
pH, UA: 5 (ref 5.0–8.0)

## 2018-04-06 NOTE — Progress Notes (Signed)
OB HISTORY AND PHYSICAL  SUBJECTIVE:       Tammy Snyder is a 28 y.o. G28P2002 female, Patient's last menstrual period was 01/11/2018 (approximate)., Estimated Date of Delivery: 10/21/18, [redacted]w[redacted]d, presents today, present for Prenatal Care. Complaints today include: none      Gynecologic History Patient's last menstrual period was 01/11/2018 (approximate). Normal Contraception: none Last Pap: 2013. Results were: hx of abnormal not sure of result   Obstetric History OB History  Gravida Para Term Preterm AB Living  3 2 2  0 0 2  SAB TAB Ectopic Multiple Live Births  0 0 0 0 2    # Outcome Date GA Lbr Len/2nd Weight Sex Delivery Anes PTL Lv  3 Current           2 Term 02/21/13   7 lb 14 oz (3.572 kg) M Vag-Spont  N LIV  1 Term 10/29/10 [redacted]w[redacted]d 29:01 / 03:53 9 lb 0.8 oz (4.105 kg) M CS-LTranv EPI  LIV     Birth Comments: No dysmophic features; voided on OR table.     Past Medical History:  Diagnosis Date  . Bipolar 1 disorder (HCC)   . Borderline personality disorder (HCC)   . Interstitial cystitis   . Mental disorder    Borderline personalities disorder  . Oliguria 2013   cystoscopy, hydrodilation under anesthesia  . Polysubstance abuse (HCC)   . Vaginal Pap smear, abnormal     Past Surgical History:  Procedure Laterality Date  . Calibration of urethra with dilation of the urethra under anesthesia  04/04/2011   Dr. Rosezetta Schlatter  . CESAREAN SECTION  10/29/2010   Procedure: CESAREAN SECTION;  Surgeon: Lazaro Arms, MD;  Location: [facility];  Service: Gynecology;;  . COCHLEAR IMPLANT    . COCHLEAR IMPLANT    . cystoscopy, hydrodilation under anesthesia  04/04/2011    Current Outpatient Medications on File Prior to Visit  Medication Sig Dispense Refill  . Prenatal Vit-Fe Fumarate-FA (PRENATAL MULTIVITAMIN) TABS tablet Take 1 tablet by mouth daily at 12 noon.     No current facility-administered medications on file prior to visit.     Allergies  Allergen Reactions  . Latex  Rash  . Nsaids Rash  . Tylenol [Acetaminophen] Itching and Rash    Social History   Socioeconomic History  . Marital status: Single    Spouse name: Not on file  . Number of children: Not on file  . Years of education: Not on file  . Highest education level: Not on file  Occupational History  . Not on file  Social Needs  . Financial resource strain: Not on file  . Food insecurity:    Worry: Not on file    Inability: Not on file  . Transportation needs:    Medical: Not on file    Non-medical: Not on file  Tobacco Use  . Smoking status: Light Tobacco Smoker    Packs/day: 0.25    Years: 8.00    Pack years: 2.00    Types: Cigarettes  . Smokeless tobacco: Never Used  Substance and Sexual Activity  . Alcohol use: Yes    Alcohol/week: 0.0 standard drinks    Comment: occ  . Drug use: No  . Sexual activity: Yes    Partners: Male    Birth control/protection: None  Lifestyle  . Physical activity:    Days per week: Not on file    Minutes per session: Not on file  . Stress: Not on file  Relationships  .  Social connections:    Talks on phone: Not on file    Gets together: Not on file    Attends religious service: Not on file    Active member of club or organization: Not on file    Attends meetings of clubs or organizations: Not on file    Relationship status: Not on file  . Intimate partner violence:    Fear of current or ex partner: Not on file    Emotionally abused: Not on file    Physically abused: Not on file    Forced sexual activity: Not on file  Other Topics Concern  . Not on file  Social History Narrative  . Not on file    Family History  Problem Relation Age of Onset  . Hypertension Father   . Stroke Father     The following portions of the patient's history were reviewed and updated as appropriate: allergies, current medications, past OB history, past medical history, past surgical history, past family history, past social history, and problem  list.    OBJECTIVE: Initial Physical Exam (New OB)  GENERAL APPEARANCE: alert, well appearing, in no apparent distress, oriented to person, place and time HEAD: normocephalic, atraumatic MOUTH: mucous membranes moist, pharynx normal without lesions THYROID: no thyromegaly or masses present BREASTS: no masses noted, no significant tenderness, no palpable axillary nodes, no skin changes LUNGS: clear to auscultation, no wheezes, rales or rhonchi, symmetric air entry HEART: regular rate and rhythm, no murmurs ABDOMEN: soft, nontender, nondistended, no abnormal masses, no epigastric pain, fundus soft, nontender 12 weeks size and FHT present EXTREMITIES: no redness or tenderness in the calves or thighs, no edema, no limitation in range of motion, intact peripheral pulses SKIN: normal coloration and turgor, no rashes LYMPH NODES: no adenopathy palpable NEUROLOGIC: alert, oriented, normal speech, no focal findings or movement disorder noted  PELVIC EXAM EXTERNAL GENITALIA: normal appearing vulva with no masses, tenderness or lesions VAGINA: no abnormal discharge or lesions CERVIX: no lesions or cervical motion tenderness, pap today, contact bleeding present UTERUS: gravid ADNEXA: no masses palpable and nontender OB EXAM PELVIMETRY: appears adequate RECTUM: exam not indicated  ASSESSMENT: Normal pregnancy  PLAN: New OB counseling: The patient has been given an overview regarding routine prenatal care. Recommendations regarding diet, weight gain, and exercise in pregnancy were given. Prenatal testing, optional genetic testing,carrier screening and ultrasound use in pregnancy were reviewed. Panorama testing today. Benefits of Breast Feeding were discussed. The patient is encouraged to consider nursing her baby post partum.  Doreene Burke, CNM

## 2018-04-06 NOTE — Patient Instructions (Addendum)
WE WOULD LOVE TO HEAR FROM YOU!!!!   Thank you Nigel Berthold for visiting [facility].  Providing our patients with the best experience possible is really important to [place], and we hope that you felt that on your recent visit. The most valuable feedback we get comes from [place]!!    If you receive a survey please take a couple of minutes to let us know how we did.Thank you for continuing to trust [place] with your care.   [facility]   Common Medications Safe in Pregnancy  Acne:      Constipation:  Benzoyl Peroxide     Colace  Clindamycin      Dulcolax Suppository  Topica Erythromycin     Fibercon  Salicylic Acid      Metamucil         Miralax AVOID:        Senakot   Accutane    Cough:  Retin-A       Cough Drops  Tetracycline      Phenergan w/ Codeine if Rx  Minocycline      Robitussin (Plain & DM)  Antibiotics:     Crabs/Lice:  Ceclor       RID  Cephalosporins    AVOID:  E-Mycins      Kwell  Keflex  Macrobid/Macrodantin   Diarrhea:  Penicillin      Kao-Pectate  Zithromax      Imodium AD         PUSH FLUIDS AVOID:       Cipro     Fever:  Tetracycline      Tylenol (Regular or Extra  Minocycline       Strength)  Levaquin      Extra Strength-Do not          Exceed 8 tabs/24 hrs Caffeine:        <23m/day (equiv. To 1 cup of coffee or  approx. 3 12 oz sodas)         Gas: Cold/Hayfever:       Gas-X  Benadryl      Mylicon  Claritin       Phazyme  **Claritin-D        Chlor-Trimeton    Headaches:  Dimetapp      ASA-Free Excedrin  Drixoral-Non-Drowsy     Cold Compress  Mucinex (Guaifenasin)     Tylenol (Regular or Extra  Sudafed/Sudafed-12 Hour     Strength)  **Sudafed PE Pseudoephedrine   Tylenol Cold & Sinus     Vicks Vapor Rub  Zyrtec  **AVOID if Problems With Blood Pressure         Heartburn: Avoid lying down for at least 1 hour after meals  Aciphex      Maalox     Rash:  Milk of Magnesia     Benadryl    Mylanta       1%  Hydrocortisone Cream  Pepcid  Pepcid Complete   Sleep Aids:  Prevacid      Ambien   Prilosec       Benadryl  Rolaids       Chamomile Tea  Tums (Limit 4/day)     Unisom  Zantac       Tylenol PM         Warm milk-add vanilla or  Hemorrhoids:       Sugar for taste  Anusol/Anusol H.C.  (RX: Analapram 2.5%)  Sugar Substitutes:  Hydrocortisone OTC     Ok in moderation  Preparation H      Tucks        Vaseline lotion applied to tissue with wiping    Herpes:     Throat:  Acyclovir      Oragel  Famvir  Valtrex     Vaccines:         Flu Shot Leg Cramps:       *Gardasil  Benadryl      Hepatitis A         Hepatitis B Nasal Spray:       Pneumovax  Saline Nasal Spray     Polio Booster         Tetanus Nausea:       Tuberculosis test or PPD  Vitamin B6 25 mg TID   AVOID:    Dramamine      *Gardasil  Emetrol       Live Poliovirus  Ginger Root 250 mg QID    MMR (measles, mumps &  High Complex Carbs @ Bedtime    rebella)  Sea Bands-Accupressure    Varicella (Chickenpox)  Unisom 1/2 tab TID     *No known complications           If received before Pain:         Known pregnancy;   Darvocet       Resume series after  Lortab        Delivery  Percocet    Yeast:   Tramadol      Femstat  Tylenol 3      Gyne-lotrimin  Ultram       Monistat  Vicodin           MISC:         All Sunscreens           Hair Coloring/highlights          Insect Repellant's          (Including DEET)         Mystic Tans Prenatal Care Prenatal care is health care during pregnancy. It helps you and your unborn baby (fetus) stay as healthy as possible. Prenatal care may be provided by a midwife, a family practice health care provider, or a childbirth and pregnancy specialist (obstetrician). How does this affect me? During pregnancy, you will be closely monitored for any new conditions that might develop. To lower your risk of pregnancy complications, you and your health care provider will talk about any underlying  conditions you have. How does this affect my baby? Early and consistent prenatal care increases the chance that your baby will be healthy during pregnancy. Prenatal care lowers the risk that your baby will be:  Born early (prematurely).  Smaller than expected at birth (small for gestational age). What can I expect at the first prenatal care visit? Your first prenatal care visit will likely be the longest. You should schedule your first prenatal care visit as soon as you know that you are pregnant. Your first visit is a good time to talk about any questions or concerns you have about pregnancy. At your visit, you and your health care provider will talk about:  Your medical history, including: ? Any past pregnancies. ? Your family's medical history. ? The baby's father's medical history. ? Any long-term (chronic) health conditions you have and how you manage them. ? Any surgeries or procedures you have had. ? Any current over-the-counter or prescription medicines, herbs, or supplements you are taking.  Other factors that could pose  a risk to your baby, including:  Your home setting and your stress levels, including: ? Exposure to abuse or violence. ? Household financial strain. ? Mental health conditions you have.  Your daily health habits, including diet and exercise. Your health care provider will also:  Measure your weight, height, and blood pressure.  Do a physical exam, including a pelvic and breast exam.  Perform blood tests and urine tests to check for: ? Urinary tract infection. ? Sexually transmitted infections (STIs). ? Low iron levels in your blood (anemia). ? Blood type and certain proteins on red blood cells (Rh antibodies). ? Infections and immunity to viruses, such as hepatitis B and rubella. ? HIV (human immunodeficiency virus).  Do an ultrasound to confirm your baby's growth and development and to help predict your estimated due date (EDD). This ultrasound is  done with a probe that is inserted into the vagina (transvaginal ultrasound).  Discuss your options for genetic screening.  Give you information about how to keep yourself and your baby healthy, including: ? Nutrition and taking vitamins. ? Physical activity. ? How to manage pregnancy symptoms such as nausea and vomiting (morning sickness). ? Infections and substances that may be harmful to your baby and how to avoid them. ? Food safety. ? Dental care. ? Working. ? Travel. ? Warning signs to watch for and when to call your health care provider. How often will I have prenatal care visits? After your first prenatal care visit, you will have regular visits throughout your pregnancy. The visit schedule is often as follows:  Up to week 28 of pregnancy: once every 4 weeks.  28-36 weeks: once every 2 weeks.  After 36 weeks: every week until delivery. Some women may have visits more or less often depending on any underlying health conditions and the health of the baby. Keep all follow-up and prenatal care visits as told by your health care provider. This is important. What happens during routine prenatal care visits? Your health care provider will:  Measure your weight and blood pressure.  Check for fetal heart sounds.  Measure the height of your uterus in your abdomen (fundal height). This may be measured starting around week 20 of pregnancy.  Check the position of your baby inside your uterus.  Ask questions about your diet, sleeping patterns, and whether you can feel the baby move.  Review warning signs to watch for and signs of labor.  Ask about any pregnancy symptoms you are having and how you are dealing with them. Symptoms may include: ? Headaches. ? Nausea and vomiting. ? Vaginal discharge. ? Swelling. ? Fatigue. ? Constipation. ? Any discomfort, including back or pelvic pain. Make a list of questions to ask your health care provider at your routine visits. What tests  might I have during prenatal care visits? You may have blood, urine, and imaging tests throughout your pregnancy, such as:  Urine tests to check for glucose, protein, or signs of infection.  Glucose tests to check for a form of diabetes that can develop during pregnancy (gestational diabetes mellitus). This is usually done around week 24 of pregnancy.  An ultrasound to check your baby's growth and development and to check for birth defects. This is usually done around week 20 of pregnancy.  A test to check for group B strep (GBS) infection. This is usually done around week 36 of pregnancy.  Genetic testing. This may include blood or imaging tests, such as an ultrasound. Some genetic tests are done during  the first trimester and some are done during the second trimester. What else can I expect during prenatal care visits? Your health care provider may recommend getting certain vaccines during pregnancy. These may include:  A yearly flu shot (annual influenza vaccine). This is especially important if you will be pregnant during flu season.  Tdap (tetanus, diphtheria, pertussis) vaccine. Getting this vaccine during pregnancy can protect your baby from whooping cough (pertussis) after birth. This vaccine may be recommended between weeks 27 and 36 of pregnancy. Later in your pregnancy, your health care provider may give you information about:  Childbirth and breastfeeding classes.  Choosing a health care provider for your baby.  Umbilical cord banking.  Breastfeeding.  Birth control after your baby is born.  The hospital labor and delivery unit and how to tour it.  Registering at the hospital before you go into labor. Where to find more information  Office on Women's Health: LegalWarrants.gl  American Pregnancy Association: americanpregnancy.org  March of Dimes: marchofdimes.org Summary  Prenatal care helps you and your baby stay as healthy as possible during pregnancy.  Your  first prenatal care visit will most likely be the longest.  You will have visits and tests throughout your pregnancy to monitor your health and your baby's health.  Bring a list of questions to your visits to ask your health care provider.  Make sure to keep all follow-up and prenatal care visits with your health care provider. This information is not intended to replace advice given to you by your health care provider. Make sure you discuss any questions you have with your health care provider. Document Released: 01/06/2003 Document Revised: 01/02/2017 Document Reviewed: 01/02/2017 Elsevier Interactive Patient Education  2019 [employer].

## 2018-04-07 LAB — CBC
Hematocrit: 33.9 % — ABNORMAL LOW (ref 34.0–46.6)
Hemoglobin: 11.9 g/dL (ref 11.1–15.9)
MCH: 33.1 pg — ABNORMAL HIGH (ref 26.6–33.0)
MCHC: 35.1 g/dL (ref 31.5–35.7)
MCV: 94 fL (ref 79–97)
Platelets: 197 10*3/uL (ref 150–450)
RBC: 3.59 x10E6/uL — ABNORMAL LOW (ref 3.77–5.28)
RDW: 12.8 % (ref 11.7–15.4)
WBC: 7.8 10*3/uL (ref 3.4–10.8)

## 2018-04-08 LAB — GC/CHLAMYDIA PROBE AMP
CHLAMYDIA, DNA PROBE: POSITIVE — AB
Neisseria gonorrhoeae by PCR: NEGATIVE

## 2018-04-09 ENCOUNTER — Other Ambulatory Visit: Payer: Self-pay | Admitting: Certified Nurse Midwife

## 2018-04-09 LAB — CYTOLOGY - PAP
CHLAMYDIA, DNA PROBE: POSITIVE — AB
Diagnosis: NEGATIVE
Neisseria Gonorrhea: NEGATIVE
Trichomonas: NEGATIVE

## 2018-04-09 MED ORDER — AZITHROMYCIN 500 MG PO TABS: 1000.0000 mg | ORAL_TABLET | Freq: Once | ORAL | 1 refills | Status: AC

## 2018-04-09 NOTE — Progress Notes (Signed)
Pt positive for chlamydia, orders placed for treatment.   Doreene Burke, CNM

## 2018-04-10 ENCOUNTER — Emergency Department: Payer: Medicaid Other

## 2018-04-10 ENCOUNTER — Emergency Department
Admission: EM | Admit: 2018-04-10 | Discharge: 2018-04-10 | Discharge status: Against Medical Advice | Payer: Medicaid Other | Attending: Emergency Medicine | Admitting: Emergency Medicine

## 2018-04-10 ENCOUNTER — Other Ambulatory Visit: Payer: Self-pay

## 2018-04-10 ENCOUNTER — Telehealth: Payer: Self-pay | Admitting: Obstetrics and Gynecology

## 2018-04-10 DIAGNOSIS — O9A311 Physical abuse complicating pregnancy, first trimester: Secondary | ICD-10-CM | POA: Diagnosis not present

## 2018-04-10 DIAGNOSIS — O99331 Smoking (tobacco) complicating pregnancy, first trimester: Secondary | ICD-10-CM | POA: Diagnosis not present

## 2018-04-10 DIAGNOSIS — O9A211 Injury, poisoning and certain other consequences of external causes complicating pregnancy, first trimester: Secondary | ICD-10-CM | POA: Insufficient documentation

## 2018-04-10 DIAGNOSIS — Z3A12 12 weeks gestation of pregnancy: Secondary | ICD-10-CM | POA: Diagnosis not present

## 2018-04-10 DIAGNOSIS — F17210 Nicotine dependence, cigarettes, uncomplicated: Secondary | ICD-10-CM | POA: Insufficient documentation

## 2018-04-10 DIAGNOSIS — R1032 Left lower quadrant pain: Secondary | ICD-10-CM

## 2018-04-10 LAB — COMPREHENSIVE METABOLIC PANEL
ALT: 22 U/L (ref 0–44)
AST: 23 U/L (ref 15–41)
Albumin: 4.1 g/dL (ref 3.5–5.0)
Alkaline Phosphatase: 66 U/L (ref 38–126)
Anion gap: 9 (ref 5–15)
BUN: 7 mg/dL (ref 6–20)
CO2: 20 mmol/L — ABNORMAL LOW (ref 22–32)
Calcium: 8.8 mg/dL — ABNORMAL LOW (ref 8.9–10.3)
Chloride: 105 mmol/L (ref 98–111)
Creatinine, Ser: 0.35 mg/dL — ABNORMAL LOW (ref 0.44–1.00)
Glucose, Bld: 77 mg/dL (ref 70–99)
Potassium: 3.4 mmol/L — ABNORMAL LOW (ref 3.5–5.1)
Sodium: 134 mmol/L — ABNORMAL LOW (ref 135–145)
Total Bilirubin: 0.3 mg/dL (ref 0.3–1.2)
Total Protein: 7 g/dL (ref 6.5–8.1)

## 2018-04-10 LAB — URINALYSIS, COMPLETE (UACMP) WITH MICROSCOPIC
Bacteria, UA: NONE SEEN
Bilirubin Urine: NEGATIVE
Glucose, UA: NEGATIVE mg/dL
Hgb urine dipstick: NEGATIVE
Ketones, ur: 20 mg/dL — AB
Leukocytes,Ua: NEGATIVE
Nitrite: NEGATIVE
PH: 6 (ref 5.0–8.0)
Protein, ur: NEGATIVE mg/dL
Specific Gravity, Urine: 1.016 (ref 1.005–1.030)

## 2018-04-10 LAB — CBC WITH DIFFERENTIAL/PLATELET
Abs Immature Granulocytes: 0.04 10*3/uL (ref 0.00–0.07)
Basophils Absolute: 0 10*3/uL (ref 0.0–0.1)
Basophils Relative: 0 %
Eosinophils Absolute: 0 10*3/uL (ref 0.0–0.5)
Eosinophils Relative: 0 %
HCT: 35.2 % — ABNORMAL LOW (ref 36.0–46.0)
Hemoglobin: 12 g/dL (ref 12.0–15.0)
Immature Granulocytes: 0 %
Lymphocytes Relative: 15 %
Lymphs Abs: 2 10*3/uL (ref 0.7–4.0)
MCH: 33 pg (ref 26.0–34.0)
MCHC: 34.1 g/dL (ref 30.0–36.0)
MCV: 96.7 fL (ref 80.0–100.0)
MONO ABS: 0.7 10*3/uL (ref 0.1–1.0)
Monocytes Relative: 6 %
Neutro Abs: 10.1 10*3/uL — ABNORMAL HIGH (ref 1.7–7.7)
Neutrophils Relative %: 79 %
Platelets: 171 10*3/uL (ref 150–400)
RBC: 3.64 MIL/uL — ABNORMAL LOW (ref 3.87–5.11)
RDW: 12.8 % (ref 11.5–15.5)
WBC: 12.9 10*3/uL — AB (ref 4.0–10.5)
nRBC: 0 % (ref 0.0–0.2)

## 2018-04-10 LAB — HCG, QUANTITATIVE, PREGNANCY: hCG, Beta Chain, Quant, S: 86726 m[IU]/mL — ABNORMAL HIGH (ref <5)

## 2018-04-10 LAB — LIPASE, BLOOD: LIPASE: 27 U/L (ref 11–51)

## 2018-04-10 MED ORDER — OXYCODONE HCL 5 MG PO TABS
5.0000 mg | ORAL_TABLET | Freq: Once | ORAL | Status: AC
  Administered 2018-04-10: 5 mg via ORAL
  Filled 2018-04-10: qty 1

## 2018-04-10 NOTE — ED Notes (Addendum)
Pt came out of room stating that she was hungry and told by MD that she needed to wait until ultrasound resulted to eat. Pt states " Im starting, I haven't had anything to eat all day'. Attempted to redirect pt back into room and check on ultrasound status, and if she could eat yet. Asked pt to allow this RN to check computer for pt results. Pt unable to be redirected and left ER AMA at this time

## 2018-04-10 NOTE — ED Provider Notes (Signed)
This patient was signed out to me by Dr. Minna Antis, and I never did have a chance to see her because she left AGAINST MEDICAL ADVICE because she was not willing to wait for the results of her testing prior to eating.  I have reviewed her ultrasound results which shows a live IUP at approximately 12 weeks and her ultrasound does not show any evidence of free fluid or acute trauma.  The patient has been hemodynamically stable in the emergency department.   Rockne Menghini, MD 04/10/18 [phone]
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
## 2018-04-10 NOTE — ED Notes (Signed)
Pt up to toilet to urinate and check for vaginal discharge/bleeding. Pt reports thick white discharge, no vaginal bleeding at this time

## 2018-04-18 ENCOUNTER — Telehealth: Payer: Self-pay

## 2018-04-18 NOTE — Telephone Encounter (Signed)
Message left for pts sister who is listed as a contact to please have pt call the office-re:  Panorama results

## 2018-04-19 ENCOUNTER — Telehealth: Payer: Self-pay | Admitting: Obstetrics and Gynecology
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
## 2018-04-19 NOTE — Telephone Encounter (Signed)
meds have been sent in, 04/09/18

## 2018-04-30 ENCOUNTER — Telehealth: Payer: Self-pay | Admitting: Obstetrics and Gynecology

## 2018-04-30 NOTE — Telephone Encounter (Signed)
Called pt.

## 2018-04-30 NOTE — Telephone Encounter (Signed)
Patient was returning call from a nurse for panorama results.Thanks

## 2018-05-03 ENCOUNTER — Telehealth: Payer: Self-pay

## 2018-05-03 NOTE — Telephone Encounter (Signed)
Message left on voicemail to please return call for screening for COVID 19

## 2018-05-04 ENCOUNTER — Encounter: Payer: Self-pay | Admitting: Certified Nurse Midwife

## 2018-05-04 ENCOUNTER — Other Ambulatory Visit: Payer: Self-pay

## 2018-05-04 ENCOUNTER — Ambulatory Visit (INDEPENDENT_AMBULATORY_CARE_PROVIDER_SITE_OTHER): Payer: Medicaid Other | Admitting: Certified Nurse Midwife

## 2018-05-04 VITALS — BP 118/78 | HR 88 | Wt 135.7 lb

## 2018-05-04 DIAGNOSIS — Z3492 Encounter for supervision of normal pregnancy, unspecified, second trimester: Secondary | ICD-10-CM

## 2018-05-04 LAB — POCT URINALYSIS DIPSTICK OB
Bilirubin, UA: NEGATIVE
Blood, UA: NEGATIVE
Glucose, UA: NEGATIVE
Ketones, UA: NEGATIVE
Leukocytes, UA: NEGATIVE
Nitrite, UA: NEGATIVE
POC,PROTEIN,UA: NEGATIVE
Spec Grav, UA: 1.015 (ref 1.010–1.025)
Urobilinogen, UA: 0.2 E.U./dL
pH, UA: 6.5 (ref 5.0–8.0)

## 2018-05-04 MED ORDER — AZITHROMYCIN 500 MG PO TABS: 1000.0000 mg | ORAL_TABLET | Freq: Once | ORAL | 1 refills | Status: AC

## 2018-05-04 MED ORDER — AZITHROMYCIN 500 MG PO TABS: 1000.0000 mg | ORAL_TABLET | Freq: Once | ORAL | 1 refills | Status: DC

## 2018-05-04 NOTE — Progress Notes (Signed)
ROB- pt is doing well, states she hasnt felt any movement of her baby

## 2018-05-04 NOTE — Patient Instructions (Signed)

## 2018-05-04 NOTE — Progress Notes (Signed)
ROB, pt state she does not feel baby much. Discussed possibility of anterior placenta. Reviewed U/s at next visit for anatomy. Fetal movement heard on dopplar. Pt state she did not know about the chlamydia and has not taken any medication for treatment. She is upset and state is this from 2 yrs ago when she was rapped. She states she had contracted it but was treated. Unable to determine, explanted that she had not had any other testing done from then to now. Reassurance given. Order azithromycin again. Explained that I placed a refill for her partner to be treated. Reviewed no intercourse until adequate treatment. Explained if she thate remains positive we will need to given her alternative medication. She verbalizes and agrees to plan. Follow up 5 wks.   Doreene Burke, CNM

## 2018-05-04 NOTE — Addendum Note (Signed)
Addended by: Mechele Claude on: 05/04/2018 11:21 AM   Modules accepted: Orders

## 2018-05-04 NOTE — Addendum Note (Signed)
Addended by: Marchelle Folks on: 05/04/2018 12:00 PM   Modules accepted: Orders

## 2018-05-07 ENCOUNTER — Other Ambulatory Visit: Payer: Self-pay

## 2018-05-07 MED ORDER — AZITHROMYCIN 500 MG PO TABS: 1000.0000 mg | ORAL_TABLET | Freq: Once | ORAL | 1 refills | Status: AC

## 2018-06-05 ENCOUNTER — Ambulatory Visit (INDEPENDENT_AMBULATORY_CARE_PROVIDER_SITE_OTHER): Payer: Medicaid Other | Admitting: Obstetrics and Gynecology

## 2018-06-05 ENCOUNTER — Ambulatory Visit (INDEPENDENT_AMBULATORY_CARE_PROVIDER_SITE_OTHER): Payer: Medicaid Other

## 2018-06-05 ENCOUNTER — Other Ambulatory Visit: Payer: Self-pay

## 2018-06-05 VITALS — BP 92/50 | HR 81 | Wt 140.5 lb

## 2018-06-05 DIAGNOSIS — Z363 Encounter for antenatal screening for malformations: Secondary | ICD-10-CM | POA: Diagnosis not present

## 2018-06-05 DIAGNOSIS — Z3492 Encounter for supervision of normal pregnancy, unspecified, second trimester: Secondary | ICD-10-CM

## 2018-06-05 DIAGNOSIS — Z8619 Personal history of other infectious and parasitic diseases: Secondary | ICD-10-CM

## 2018-06-05 LAB — POCT URINALYSIS DIPSTICK OB
Bilirubin, UA: NEGATIVE
Blood, UA: NEGATIVE
Glucose, UA: NEGATIVE
Ketones, UA: NEGATIVE
Leukocytes, UA: NEGATIVE
Nitrite, UA: NEGATIVE
POC,PROTEIN,UA: NEGATIVE
Spec Grav, UA: 1.01 (ref 1.010–1.025)
Urobilinogen, UA: 0.2 E.U./dL
pH, UA: 6 (ref 5.0–8.0)

## 2018-06-05 MED ORDER — CITRANATAL BLOOM 90-1 MG PO TABS: 1.0000 | ORAL_TABLET | Freq: Every day | ORAL | 11 refills | Status: DC

## 2018-06-05 NOTE — Progress Notes (Signed)
ROB & Anatomy scan: Reviewed anatomy scan below:

## 2018-06-05 NOTE — Progress Notes (Signed)
Discussed low blood pressure and faint spells. Increase water intake and not locking knees when standing. Urine sent for TOC.

## 2018-06-05 NOTE — Progress Notes (Signed)
ROB- anatomy scan done today, will send urine off for TOC- chlamydia

## 2018-06-10 LAB — GC/CHLAMYDIA PROBE AMP
Chlamydia trachomatis, NAA: NEGATIVE
Neisseria Gonorrhoeae by PCR: NEGATIVE

## 2018-07-02 ENCOUNTER — Encounter: Payer: Self-pay | Admitting: Certified Nurse Midwife

## 2018-07-02 DIAGNOSIS — O34219 Maternal care for unspecified type scar from previous cesarean delivery: Secondary | ICD-10-CM | POA: Insufficient documentation

## 2018-07-02 DIAGNOSIS — Z8619 Personal history of other infectious and parasitic diseases: Secondary | ICD-10-CM | POA: Insufficient documentation

## 2018-07-03 ENCOUNTER — Other Ambulatory Visit: Payer: Self-pay

## 2018-07-03 ENCOUNTER — Telehealth: Payer: Self-pay | Admitting: *Deleted

## 2018-07-03 ENCOUNTER — Ambulatory Visit (INDEPENDENT_AMBULATORY_CARE_PROVIDER_SITE_OTHER): Payer: Medicaid Other | Admitting: Certified Nurse Midwife

## 2018-07-03 VITALS — BP 105/75 | HR 96 | Wt 150.2 lb

## 2018-07-03 DIAGNOSIS — Z113 Encounter for screening for infections with a predominantly sexual mode of transmission: Secondary | ICD-10-CM

## 2018-07-03 DIAGNOSIS — O26892 Other specified pregnancy related conditions, second trimester: Secondary | ICD-10-CM | POA: Insufficient documentation

## 2018-07-03 DIAGNOSIS — Z130 Encounter for screening for diseases of the blood and blood-forming organs and certain disorders involving the immune mechanism: Secondary | ICD-10-CM

## 2018-07-03 DIAGNOSIS — R102 Pelvic and perineal pain: Secondary | ICD-10-CM

## 2018-07-03 DIAGNOSIS — Z131 Encounter for screening for diabetes mellitus: Secondary | ICD-10-CM

## 2018-07-03 DIAGNOSIS — R55 Syncope and collapse: Secondary | ICD-10-CM | POA: Insufficient documentation

## 2018-07-03 DIAGNOSIS — Z3492 Encounter for supervision of normal pregnancy, unspecified, second trimester: Secondary | ICD-10-CM

## 2018-07-03 LAB — POCT URINALYSIS DIPSTICK OB
Bilirubin, UA: NEGATIVE
Blood, UA: NEGATIVE
Glucose, UA: NEGATIVE
Ketones, UA: NEGATIVE
Leukocytes, UA: NEGATIVE
Nitrite, UA: NEGATIVE
POC,PROTEIN,UA: NEGATIVE
Spec Grav, UA: 1.01 (ref 1.010–1.025)
Urobilinogen, UA: 0.2 E.U./dL
pH, UA: 6 (ref 5.0–8.0)

## 2018-07-03 NOTE — Progress Notes (Signed)
Patient c/o daily episodes of "passing out".  Also c/o intermittent pelvic and groin pain x3 months, patient noticed lump on upper abdomen "it burns".

## 2018-07-03 NOTE — Progress Notes (Signed)
ROB-Reports daily episodes of "passing out" despite increased water and protein intake. Declines CBC today. Will refer to Cardiology, see orders. Notes intermittent pelvic and grown pain and pressure for the last three (3) months. Discussed home treatment measures including abdominal support. Education regarding skin changes in pregnancy, see AVS. Anticipatory guidance regarding course of prenatal care. Reviewed red flag symptoms and when to call. RTC x 4 weeks for 28 wk labs, TDaP/BTC and ROB or sooner if needed.

## 2018-07-03 NOTE — Patient Instructions (Signed)
Skin Conditions During Pregnancy Pregnancy affects many parts of the human body. One part is the skin. Most skin problems that develop during pregnancy are not serious and are considered a normal part of pregnancy. Many skin problems go away on their own after the baby is born. What type of skin problems can develop during pregnancy? Common skin conditions  Stretch marks. Stretch marks are purple or pink lines on the skin. They may appear on the belly, breasts, thighs, or buttocks. Stretch marks are caused by weight gain, which causes the skin to stretch. Stretch marks do not cause problems. Almost all women get them during pregnancy. Most stretch marks fade after pregnancy, but may not disappear completely.  Darkening of the skin (hyperpigmentation). The darkening may occur in patches or as a line. Patches may appear on the face (melasma), nipples, or genital area. A dark line may also form and stretch from the belly button to the pubic area (linea nigra). Hyperpigmentation develops in almost all pregnant women. It is more severe in women with a dark complexion.  Spider angiomas or spider veins. These are tiny pink or red lines that go out from a center point, like the legs of a spider. Usually, they are on the face, neck, and arms. They do not cause problems. They are most common in women with light complexions. Spider veins usually fade after the baby is born.  Varicose veins. Excess blood volume during pregnancy can cause veins to enlarge. They can look like swollen veins above the surface of the skin and are usually red or purple. They are usually found on the legs and buttocks (hemorrhoids). In most cases, the varicose veins go away after pregnancy.  Pruritic urticarial papules and plaques of pregnancy (PUPPP rash). This is an itchy, red rash that has tiny blisters. The cause is unknown. It generally starts on the abdomen and may affect the arms or legs. It usually begins later in pregnancy. The  rash is not known to affect the fetus. Sometimes, oral steroids are used to soothe the itch. The rash clears after the baby is born. Uncommon skin conditions  Pemphigoid gestationis. This is a very rare autoimmune disease. It causes a severely itchy rash and blisters. The rash usually appears on the abdomen, buttocks, arms, and legs. It usually goes away within 3 months after delivery. It may return (recur) with subsequent pregnancies.  Pruritic folliculitis of pregnancy. This is a rare condition that causes pimple-like skin growths. It develops in the middle or later stages of pregnancy. The cause of this condition is not known. It usually goes away 2-3 weeks after delivery, but can recur with subsequent pregnancies.  Intrahepatic cholestasis of pregnancy. This is a rare liver condition that causes itchy skin, but not a rash. It usually occurs on the palms of the hands and soles of the feet, but may spread to the abdomen. It usually starts in the third trimester and can increase the risk of complications for the fetus. This condition usually heals after delivery, but it can recur in future pregnancies. It may run in families (inherited).  Impetigo herpetiformis. This is a form of a severe skin disease, also called pustular psoriasis. It causes many translucent, white bumps that may form a crust when they burst. It usually occurs in the third trimester. The condition usually heals after delivery, but can recur in future pregnancies.  Palmar erythema. This is a reddening of the palms. It is most common in women with light complexions. It  usually fades away after pregnancy.  Prurigo of pregnancy. This is a disease in which itchy red patches and bumps appear on the body. This condition can happen any time during pregnancy. Usually, it starts as a few bumps but increases each day. The cause is unknown. The patches and bumps clear after the baby is born. Other skin conditions  Swelling and redness. This  can occur on the face, eyelids, fingers, or toes.  Acne. Pimples may develop, including in women who have had clear skin for a long time.  Skin tags. These are small flaps of skin that stick out from the body. They may grow or become darker during pregnancy. They are usually harmless. They do not go away on their own, but can be removed by a health care provider.  Moles. These are flat or slightly raised growths. They are usually round and pink or brown. They may grow or become darker during pregnancy. Some skin problems that were there before pregnancy (pre-existing skin conditions), such as atopic dermatitis or psoriasis, may become worse during pregnancy. Follow these instructions at home: Different conditions may have different instructions for care. In general:  Follow all your health care provider's directions about medicines to treat skin problems while you are pregnant. Do not use over-the-counter medicines, creams, or lotions until you have checked with your health care provider. Many medicines are not safe to use when you are pregnant.  Limit time in the sun. This will help keep your skin from darkening. When you must be outside, use sunscreen and wear a hat with a wide brim to protect your face. The sunscreen should have a SPF of at least 36.  Use a gentle soap. This helps prevent skin irritation.  Do not get too hot or too sweaty. This makes some skin rashes worse.  Wear loose clothes made of a soft fabric. This prevents skin irritation.  Use a skin moisturizer. Ask your health care provider for suggestions.  Exercise regularly, if possible. Ask your health care provider about the activities that are safe for you. Healthy pregnant women should aim for 2 hours and 30 minutes of moderate exercise per week. This can help reduce several pregnancy side effects, including varicose veins.  Keep all follow-up visits as told by your health care provider. This is  important. Summary  Most skin problems that develop during pregnancy are not serious and are considered a normal part of pregnancy. They usually go away on their own after the baby is born.  Some common skin problems include stretch marks, darkening of the skin, spider veins, varicose veins, and PUPPP rash.  Follow all your health care provider's directions about medicines to treat skin problems while you are pregnant. Do not use any over-the-counter medicines, creams, or lotions until you have checked with your health care provider.  To prevent and manage skin problems, use gentle soap, try not to get too hot or sweaty, wear comfortable, loose-fitting clothing, and limit time in the sun.  Some pre-existing skin conditions, such as atopic dermatitis or psoriasis, may become worse during pregnancy. This information is not intended to replace advice given to you by your health care provider. Make sure you discuss any questions you have with your health care provider. Document Released: 02/05/2010 Document Revised: 04/12/2016 Document Reviewed: 04/12/2016 Elsevier Interactive Patient Education  2019 [employer]. Abdominal Pain During Pregnancy  Belly (abdominal) pain is common during pregnancy. There are many possible causes. Most of the time, it is not a  serious problem. Other times, it can be a sign that something is wrong with the pregnancy. Always tell your doctor if you have belly pain. Follow these instructions at home:  Do not have sex or put anything in your vagina until your pain goes away completely.  Get plenty of rest until your pain gets better.  Drink enough fluid to keep your pee (urine) pale yellow.  Take over-the-counter and prescription medicines only as told by your doctor.  Keep all follow-up visits as told by your doctor. This is important. Contact a doctor if:  Your pain continues or gets worse after resting.  You have lower belly pain that: ? Comes and goes at  regular times. ? Spreads to your back. ? Feels like menstrual cramps.  You have pain or burning when you pee (urinate). Get help right away if:  You have a fever or chills.  You have vaginal bleeding.  You are leaking fluid from your vagina.  You are passing tissue from your vagina.  You throw up (vomit) for more than 24 hours.  You have watery poop (diarrhea) for more than 24 hours.  Your baby is moving less than usual.  You feel very weak or faint.  You have shortness of breath.  You have very bad pain in your upper belly. Summary  Belly (abdominal) pain is common during pregnancy. There are many possible causes.  If you have belly pain during pregnancy, tell your doctor right away.  Keep all follow-up visits as told by your doctor. This is important. This information is not intended to replace advice given to you by your health care provider. Make sure you discuss any questions you have with your health care provider. Document Released: 12/22/2008 Document Revised: 04/07/2016 Document Reviewed: 04/07/2016 Elsevier Interactive Patient Education  2019 [employer] Back Pain in Pregnancy Back pain during pregnancy is common. Back pain may be caused by several factors that are related to changes during your pregnancy. Follow these instructions at home: Managing pain, stiffness, and swelling      If directed, for sudden (acute) back pain, put ice on the painful area. ? Put ice in a plastic bag. ? Place a towel between your skin and the bag. ? Leave the ice on for 20 minutes, 2-3 times per day.  If directed, apply heat to the affected area before you exercise. Use the heat source that your health care provider recommends, such as a moist heat pack or a heating pad. ? Place a towel between your skin and the heat source. ? Leave the heat on for 20-30 minutes. ? Remove the heat if your skin turns bright red. This is especially important if you are unable to feel pain,  heat, or cold. You may have a greater risk of getting burned.  If directed, massage the affected area. Activity  Exercise as told by your health care provider. Gentle exercise is the best way to prevent or manage back pain.  Listen to your body when lifting. If lifting hurts, ask for help or bend your knees. This uses your leg muscles instead of your back muscles.  Squat down when picking up something from the floor. Do not bend over.  Only use bed rest for short periods as told by your health care provider. Bed rest should only be used for the most severe episodes of back pain. Standing, sitting, and lying down  Do not stand in one place for long periods of time.  Use good posture  when sitting. Make sure your head rests over your shoulders and is not hanging forward. Use a pillow on your lower back if necessary.  Try sleeping on your side, preferably the left side, with a pregnancy support pillow or 1-2 regular pillows between your legs. ? If you have back pain after a night's rest, your bed may be too soft. ? A firm mattress may provide more support for your back during pregnancy. General instructions  Do not wear high heels.  Eat a healthy diet. Try to gain weight within your health care provider's recommendations.  Use a maternity girdle, elastic sling, or back brace as told by your health care provider.  Take over-the-counter and prescription medicines only as told by your health care provider.  Work with a physical therapist or massage therapist to find ways to manage back pain. Acupuncture or massage therapy may be helpful.  Keep all follow-up visits as told by your health care provider. This is important. Contact a health care provider if:  Your back pain interferes with your daily activities.  You have increasing pain in other parts of your body. Get help right away if:  You develop numbness, tingling, weakness, or problems with the use of your arms or legs.  You  develop severe back pain that is not controlled with medicine.  You have a change in bowel or bladder control.  You develop shortness of breath, dizziness, or you faint.  You develop nausea, vomiting, or sweating.  You have back pain that is a rhythmic, cramping pain similar to labor pains. Labor pain is usually 1-2 minutes apart, lasts for about 1 minute, and involves a bearing down feeling or pressure in your pelvis.  You have back pain and your water breaks or you have vaginal bleeding.  You have back pain or numbness that travels down your leg.  Your back pain developed after you fell.  You develop pain on one side of your back.  You see blood in your urine.  You develop skin blisters in the area of your back pain. Summary  Back pain may be caused by several factors that are related to changes during your pregnancy.  Follow instructions as told by your health care provider for managing pain, stiffness, and swelling.  Exercise as told by your health care provider. Gentle exercise is the best way to prevent or manage back pain.  Take over-the-counter and prescription medicines only as told by your health care provider.  Keep all follow-up visits as told by your health care provider. This is important. This information is not intended to replace advice given to you by your health care provider. Make sure you discuss any questions you have with your health care provider. Document Released: 04/13/2005 Document Revised: 06/21/2017 Document Reviewed: 06/21/2017 Elsevier Interactive Patient Education  2019 [employer]

## 2018-07-03 NOTE — Telephone Encounter (Signed)

## 2018-07-03 NOTE — Progress Notes (Signed)
New Outpatient Visit Date: 07/04/2018  Referring Provider: Gunnar BullaLawhorn, Tammy Snyder, CNM [address] [place],  [place] [id number]  Chief Complaint: Syncope  HPI:  Tammy Snyder is a 28 y.o. female who is being seen urgently today for the evaluation of syncope at the request of Ms. Lawhorn. She has a history of hearing impairment and is status post cochlear implants.  Tammy Snyder reports intermittent lightheadedness when standing dating back to her teenage years.  However, she had never passed out up until 4 months ago.  She is currently 6 months pregnant with her 3rd pregnancy.  She did not have any issues with syncope or near-syncope during her first two pregnancy.  She now notices that if she stands in one spot for more than a few minutes, she begins to feel lightheaded.  If she does not sit down right away, she loses her balance and even briefly blacks out.  The lightheadedness happens most days.  She is able to walk without any lightheadedness.  She often feels her heart racing when she gets lightheaded.  Tammy Snyder has notices some chest tightness in the past with anxiety attacks.  This had been well-controlled with clonazepam, though is has been taken off this medication during her pregnancy.  She otherwise denies chest pain, shortness of breath, orthopnea, PND, and edema.  She notes that her blood pressure has been running low every since she was started on Depakote for treatment of bipolar disorder as a teenager (BP has remained soft even after the medication was stopped).  She tries to stay well-hydrated with water.  She was told that she has a heart murmur as a child but otherwise has not had any cardiac problems or prior testing.  --------------------------------------------------------------------------------------------------  Cardiovascular History & Procedures: Cardiovascular Problems:  Syncope  Risk Factors:  None  Cath/PCI:  None  CV Surgery:  None  EP  Procedures and Devices:  None  Non-Invasive Evaluation(s):  None  Recent CV Pertinent Labs: Lab Results  Component Value Date   CHOL 88 10/27/2011   HDL 48 10/27/2011   LDLCALC 30 10/27/2011   TRIG 50 10/27/2011   K 3.4 (L) 04/10/2018   K 3.4 (L) 05/26/2012   BUN 7 04/10/2018   BUN 7 05/26/2012   CREATININE 0.35 (L) 04/10/2018   CREATININE 0.74 05/26/2012    --------------------------------------------------------------------------------------------------  Past Medical History:  Diagnosis Date  . Bipolar 1 disorder (HCC)   . Borderline personality disorder (HCC)   . Cochlear implant in place   . Hearing loss   . Interstitial cystitis   . Mental disorder    Borderline personalities disorder  . Oliguria 2013   cystoscopy, hydrodilation under anesthesia  . Polysubstance abuse (HCC)   . Vaginal Pap smear, abnormal     Past Surgical History:  Procedure Laterality Date  . Calibration of urethra with dilation of the urethra under anesthesia  04/04/2011   Dr. Rosezetta SchlatterJohn Harman  . CESAREAN SECTION  10/29/2010   Procedure: CESAREAN SECTION;  Surgeon: Lazaro ArmsLuther H Eure, MD;  Location: [facility];  Service: Gynecology;;  . CESAREAN SECTION    . COCHLEAR IMPLANT    . COCHLEAR IMPLANT    . cystoscopy, hydrodilation under anesthesia  04/04/2011    No outpatient medications have been marked as taking for the 07/04/18 encounter (Appointment) with Wauneta Silveria, Cristal Deerhristopher, MD.    Allergies: Latex, Nsaids, and Tylenol [acetaminophen]  Social History   Tobacco Use  . Smoking status: Former Smoker  Packs/day: 0.25    Years: 8.00    Pack years: 2.00    Types: Cigarettes  . Smokeless tobacco: Never Used  Substance Use Topics  . Alcohol use: Not Currently    Alcohol/week: 0.0 standard drinks    Comment: occ  . Drug use: No    Family History  Problem Relation Age of Onset  . Hypertension Father   . Stroke Father     Review of Systems: A 12-system review of systems was performed and  was negative except as noted in the HPI.  --------------------------------------------------------------------------------------------------  Physical Exam: LMP 01/11/2018 (Approximate)   General:  NAD HEENT: No conjunctival pallor or scleral icterus. Moist mucous membranes. OP clear. Neck: Supple without lymphadenopathy, thyromegaly, JVD, or HJR. No carotid bruit. Lungs: Normal work of breathing. Clear to auscultation bilaterally without wheezes or crackles. Heart: Regular rate and rhythm with 1/6 systolic murmur at the base.  No rubs or gallops. Non-displaced PMI. Abd: Bowel sounds present. Gravid uterus noted.  Soft, NT/ND without hepatosplenomegaly Ext: No lower extremity edema. Radial, PT, and DP pulses are 2+ bilaterally Skin: Warm and dry without rash. Neuro: CNIII-XII intact. Strength and fine-touch sensation intact in upper and lower extremities bilaterally. Psych: Normal mood and affect.  EKG:  NSR without abnormality.  Lab Results  Component Value Date   WBC 12.9 (H) 04/10/2018   HGB 12.0 04/10/2018   HCT 35.2 (L) 04/10/2018   MCV 96.7 04/10/2018   PLT 171 04/10/2018    Lab Results  Component Value Date   NA 134 (L) 04/10/2018   K 3.4 (L) 04/10/2018   CL 105 04/10/2018   CO2 20 (L) 04/10/2018   BUN 7 04/10/2018   CREATININE 0.35 (L) 04/10/2018   GLUCOSE 77 04/10/2018   ALT 22 04/10/2018    Lab Results  Component Value Date   CHOL 88 10/27/2011   HDL 48 10/27/2011   LDLCALC 30 10/27/2011   TRIG 50 10/27/2011     --------------------------------------------------------------------------------------------------  ASSESSMENT AND PLAN: Orthostatic lightheadedness, palpitations, and syncope: 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  This could be exacerbated by physiologic changes in pregnancy and compression of the IVC by a gravid uterus (though this is more common when the patient is  recumbent).  We will obtain an echocardiogram as well as a 48-hour monitor.  I have encouraged [name] to increase her fluid and sodium intake, as well as to wear compression stockings.  Given recurrent syncope, I have advised her to refrain from driving.  Follow-up: 1 month virtual or in-person visit.  Nelva Bush, MD 07/03/2018 5:51 PM

## 2018-07-04 ENCOUNTER — Encounter: Payer: Self-pay | Admitting: Internal Medicine

## 2018-07-04 ENCOUNTER — Ambulatory Visit (INDEPENDENT_AMBULATORY_CARE_PROVIDER_SITE_OTHER): Payer: Medicaid Other

## 2018-07-04 ENCOUNTER — Ambulatory Visit (INDEPENDENT_AMBULATORY_CARE_PROVIDER_SITE_OTHER): Payer: Medicaid Other | Admitting: Internal Medicine

## 2018-07-04 VITALS — BP 107/71 | HR 84 | Ht 60.5 in | Wt 148.5 lb

## 2018-07-04 DIAGNOSIS — R55 Syncope and collapse: Secondary | ICD-10-CM

## 2018-07-04 DIAGNOSIS — R002 Palpitations: Secondary | ICD-10-CM

## 2018-07-04 DIAGNOSIS — R42 Dizziness and giddiness: Secondary | ICD-10-CM | POA: Diagnosis not present

## 2018-07-04 NOTE — Patient Instructions (Signed)
Dr End recommends you increase your intake of fluid and salt. Dr End recommends you where compression stockings as advised by your OBGYN doctor.  Medication Instructions:  Your physician recommends that you continue on your current medications as directed. Please refer to the Current Medication list given to you today.  If you need a refill on your cardiac medications before your next appointment, please call your pharmacy.   Lab work: - None ordered.  If you have labs (blood work) drawn today and your tests are completely normal, you will receive your results only by: Marland Kitchen. MyChart Message (if you have MyChart) OR . A paper copy in the mail If you have any lab test that is abnormal or we need to change your treatment, we will call you to review the results.  Testing/Procedures: Your physician has requested that you have an echocardiogram. Echocardiography is a painless test that uses sound waves to create images of your heart. It provides your doctor with information about the size and shape of your heart and how well your heart's chambers and valves are working. This procedure takes approximately one hour. There are no restrictions for this procedure.   A Zio Patch Event Heart monitor will be applied to your chest today.  You will wear the patch for 3 days. After 24 hours, you may shower with the heart monitor on. If you feel any SYMPTOMS, you may press and release the button in the middle of the monitor. APPLIED 07/04/18 REMOVE 07/07/18   Follow-Up: At [facility], you and your health needs are our priority.  As part of our continuing mission to provide you with exceptional heart care, we have created designated Provider Care Teams.  These Care Teams include your primary Cardiologist (physician) and Advanced Practice Providers (APPs -  Physician Assistants and Nurse Practitioners) who all work together to provide you with the care you need, when you need it. You will need a follow up  appointment in 1 months  AS VIRTUAL OR IN OFFICE WITH DR END OR APP.  Please call our office 2 months in advance to schedule this appointment.  You may see DR Cristal DeerHRISTOPHER END or one of the following Advanced Practice Providers on your designated Care Team:   Nicolasa Duckinghristopher Berge, NP Eula Listenyan Dunn, PA-C . Marisue IvanJacquelyn Visser, PA-C     Echocardiogram An echocardiogram is a procedure that uses painless sound waves (ultrasound) to produce an image of the heart. Images from an echocardiogram can provide important information about:  Signs of coronary artery disease (CAD).  Aneurysm detection. An aneurysm is a weak or damaged part of an artery wall that bulges out from the normal force of blood pumping through the body.  Heart size and shape. Changes in the size or shape of the heart can be associated with certain conditions, including heart failure, aneurysm, and CAD.  Heart muscle function.  Heart valve function.  Signs of a past heart attack.  Fluid buildup around the heart.  Thickening of the heart muscle.  A tumor or infectious growth around the heart valves. Tell a health care provider about:  Any allergies you have.  All medicines you are taking, including vitamins, herbs, eye drops, creams, and over-the-counter medicines.  Any blood disorders you have.  Any surgeries you have had.  Any medical conditions you have.  Whether you are pregnant or may be pregnant. What are the risks? Generally, this is a safe procedure. However, problems may occur, including:  Allergic reaction to dye (contrast)  that may be used during the procedure. What happens before the procedure? No specific preparation is needed. You may eat and drink normally. What happens during the procedure?   An IV tube may be inserted into one of your veins.  You may receive contrast through this tube. A contrast is an injection that improves the quality of the pictures from your heart.  A gel will be applied to  your chest.  A wand-like tool (transducer) will be moved over your chest. The gel will help to transmit the sound waves from the transducer.  The sound waves will harmlessly bounce off of your heart to allow the heart images to be captured in real-time motion. The images will be recorded on a computer. The procedure may vary among health care providers and hospitals. What happens after the procedure?  You may return to your normal, everyday life, including diet, activities, and medicines, unless your health care provider tells you not to do that. Summary  An echocardiogram is a procedure that uses painless sound waves (ultrasound) to produce an image of the heart.  Images from an echocardiogram can provide important information about the size and shape of your heart, heart muscle function, heart valve function, and fluid buildup around your heart.  You do not need to do anything to prepare before this procedure. You may eat and drink normally.  After the echocardiogram is completed, you may return to your normal, everyday life, unless your health care provider tells you not to do that. This information is not intended to replace advice given to you by your health care provider. Make sure you discuss any questions you have with your health care provider. Document Released: 01/01/2000 Document Revised: 02/06/2016 Document Reviewed: 02/06/2016 Elsevier Interactive Patient Education  2019 [employer].

## 2018-07-18 ENCOUNTER — Encounter: Payer: Self-pay | Admitting: *Deleted

## 2018-07-18 ENCOUNTER — Telehealth: Payer: Self-pay | Admitting: *Deleted

## 2018-07-18 DIAGNOSIS — R002 Palpitations: Secondary | ICD-10-CM

## 2018-07-18 DIAGNOSIS — R55 Syncope and collapse: Secondary | ICD-10-CM

## 2018-07-18 NOTE — Telephone Encounter (Signed)
-----   Message from Nelva Bush, MD sent at 07/18/2018  7:51 AM EDT ----- Please let Tammy Snyder know that her monitor showed rare extra beats (PAC's and PVC's) as well as a single 4-beat run of nonsustained ventricular tachycardia.  These findings may explain some of her palpitations but do not account for her syncope.  We should proceed with echocardiogram as planned.  I also recommend that we check a BMP, magnesium level, and TSH when she comes in for her echo.

## 2018-07-18 NOTE — Telephone Encounter (Signed)
Patient is very hard of hearing over the phone. Patient had her friend with her and she gave me permission to give results to friend. Her friend then relayed the results to the patient who confirmed understanding. She is aware to get lab work on the day of echo. Patient requested I mail her the results and instructions. Letter mailed.

## 2018-07-31 ENCOUNTER — Encounter: Payer: Self-pay | Admitting: Certified Nurse Midwife

## 2018-07-31 ENCOUNTER — Ambulatory Visit (INDEPENDENT_AMBULATORY_CARE_PROVIDER_SITE_OTHER): Payer: Medicaid Other | Admitting: Certified Nurse Midwife

## 2018-07-31 ENCOUNTER — Other Ambulatory Visit: Payer: Self-pay

## 2018-07-31 ENCOUNTER — Other Ambulatory Visit: Payer: Medicaid Other

## 2018-07-31 VITALS — BP 100/51 | HR 79 | Wt 154.0 lb

## 2018-07-31 DIAGNOSIS — Z23 Encounter for immunization: Secondary | ICD-10-CM | POA: Diagnosis not present

## 2018-07-31 DIAGNOSIS — Z3492 Encounter for supervision of normal pregnancy, unspecified, second trimester: Secondary | ICD-10-CM

## 2018-07-31 LAB — POCT URINALYSIS DIPSTICK OB
Bilirubin, UA: NEGATIVE
Blood, UA: NEGATIVE
Glucose, UA: NEGATIVE
Ketones, UA: NEGATIVE
Leukocytes, UA: NEGATIVE
Nitrite, UA: NEGATIVE
POC,PROTEIN,UA: NEGATIVE
Spec Grav, UA: 1.025 (ref 1.010–1.025)
Urobilinogen, UA: 0.2 E.U./dL
pH, UA: 7 (ref 5.0–8.0)

## 2018-07-31 MED ORDER — TETANUS-DIPHTH-ACELL PERTUSSIS 5-2.5-18.5 LF-MCG/0.5 IM SUSP
0.5000 mL | Freq: Once | INTRAMUSCULAR | Status: AC
  Administered 2018-07-31: 0.5 mL via INTRAMUSCULAR

## 2018-07-31 NOTE — Patient Instructions (Signed)
Glucose Tolerance Test During Pregnancy Why am I having this test? The glucose tolerance test (GTT) is done to check how your body processes sugar (glucose). This is one of several tests used to diagnose diabetes that develops during pregnancy (gestational diabetes mellitus). Gestational diabetes is a temporary form of diabetes that some women develop during pregnancy. It usually occurs during the second trimester of pregnancy and goes away after delivery. Testing (screening) for gestational diabetes usually occurs between 24 and 28 weeks of pregnancy. You may have the GTT test after having a 1-hour glucose screening test if the results from that test indicate that you may have gestational diabetes. You may also have this test if:  You have a history of gestational diabetes.  You have a history of giving birth to very large babies or have experienced repeated fetal loss (stillbirth).  You have signs and symptoms of diabetes, such as: ? Changes in your vision. ? Tingling or numbness in your hands or feet. ? Changes in hunger, thirst, and urination that are not otherwise explained by your pregnancy. What is being tested? This test measures the amount of glucose in your blood at different times during a period of 3 hours. This indicates how well your body is able to process glucose. What kind of sample is taken?  Blood samples are required for this test. They are usually collected by inserting a needle into a blood vessel. How do I prepare for this test?  For 3 days before your test, eat normally. Have plenty of carbohydrate-rich foods.  Follow instructions from your health care provider about: ? Eating or drinking restrictions on the day of the test. You may be asked to not eat or drink anything other than water (fast) starting 8-10 hours before the test. ? Changing or stopping your regular medicines. Some medicines may interfere with this test. Tell a health care provider about:  All  medicines you are taking, including vitamins, herbs, eye drops, creams, and over-the-counter medicines.  Any blood disorders you have.  Any surgeries you have had.  Any medical conditions you have. What happens during the test? First, your blood glucose will be measured. This is referred to as your fasting blood glucose, since you fasted before the test. Then, you will drink a glucose solution that contains a certain amount of glucose. Your blood glucose will be measured again 1, 2, and 3 hours after drinking the solution. This test takes about 3 hours to complete. You will need to stay at the testing location during this time. During the testing period:  Do not eat or drink anything other than the glucose solution.  Do not exercise.  Do not use any products that contain nicotine or tobacco, such as cigarettes and e-cigarettes. If you need help stopping, ask your health care provider. The testing procedure may vary among health care providers and hospitals. How are the results reported? Your results will be reported as milligrams of glucose per deciliter of blood (mg/dL) or millimoles per liter (mmol/L). Your health care provider will compare your results to normal ranges that were established after testing a large group of people (reference ranges). Reference ranges may vary among labs and hospitals. For this test, common reference ranges are:  Fasting: less than 95-105 mg/dL (5.3-5.8 mmol/L).  1 hour after drinking glucose: less than 180-190 mg/dL (10.0-10.5 mmol/L).  2 hours after drinking glucose: less than 155-165 mg/dL (8.6-9.2 mmol/L).  3 hours after drinking glucose: 140-145 mg/dL (7.8-8.1 mmol/L). What do the   results mean? Results within reference ranges are considered normal, meaning that your glucose levels are well-controlled. If two or more of your blood glucose levels are high, you may be diagnosed with gestational diabetes. If only one level is high, your health care  provider may suggest repeat testing or other tests to confirm a diagnosis. Talk with your health care provider about what your results mean. Questions to ask your health care provider Ask your health care provider, or the department that is doing the test:  When will my results be ready?  How will I get my results?  What are my treatment options?  What other tests do I need?  What are my next steps? Summary  The glucose tolerance test (GTT) is one of several tests used to diagnose diabetes that develops during pregnancy (gestational diabetes mellitus). Gestational diabetes is a temporary form of diabetes that some women develop during pregnancy.  You may have the GTT test after having a 1-hour glucose screening test if the results from that test indicate that you may have gestational diabetes. You may also have this test if you have any symptoms or risk factors for gestational diabetes.  Talk with your health care provider about what your results mean. This information is not intended to replace advice given to you by your health care provider. Make sure you discuss any questions you have with your health care provider. Document Released: 07/05/2011 Document Revised: 04/26/2018 Document Reviewed: 08/15/2016 Elsevier Patient Education  2020 Elsevier Inc.  

## 2018-07-31 NOTE — Progress Notes (Signed)
ROB pt feels good fetal movement. She has echocardiogram scheduled for this Friday. State she wore a monitor x 3 days and was told she has "extra heart beat".  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  This could be exacerbated by physiologic changes in pregnancy and compression of the IVC by a gravid uterus (though this is more common when the patient is recumbent).  We will obtain an echocardiogram as well as a 48-hour monitor.  I have encouraged Tammy Snyder to increase her fluid and sodium intake, as well as to wear compression stockings.  Given recurrent syncope, I have advised her to refrain from driving.   Glucose screen/cbc/BTC/Tdap/RPR today. Pt plans on nexplanon pp for [facility]. She was given a sample birth plan. Follow up 2 wks for ROB.   Philip Aspen, CNM

## 2018-08-01 ENCOUNTER — Emergency Department: Payer: Medicaid Other

## 2018-08-01 ENCOUNTER — Telehealth: Payer: Self-pay

## 2018-08-01 ENCOUNTER — Other Ambulatory Visit: Payer: Self-pay | Admitting: Certified Nurse Midwife

## 2018-08-01 ENCOUNTER — Other Ambulatory Visit: Payer: Self-pay

## 2018-08-01 ENCOUNTER — Emergency Department
Admission: EM | Admit: 2018-08-01 | Discharge: 2018-08-01 | Disposition: A | Discharge status: Home / Self Care | Payer: Medicaid Other | Attending: Emergency Medicine | Admitting: Emergency Medicine

## 2018-08-01 ENCOUNTER — Encounter: Payer: Self-pay | Admitting: Emergency Medicine

## 2018-08-01 DIAGNOSIS — Z87891 Personal history of nicotine dependence: Secondary | ICD-10-CM | POA: Insufficient documentation

## 2018-08-01 DIAGNOSIS — Z79899 Other long term (current) drug therapy: Secondary | ICD-10-CM | POA: Insufficient documentation

## 2018-08-01 DIAGNOSIS — R002 Palpitations: Secondary | ICD-10-CM | POA: Diagnosis present

## 2018-08-01 DIAGNOSIS — Z3A29 29 weeks gestation of pregnancy: Secondary | ICD-10-CM | POA: Diagnosis not present

## 2018-08-01 DIAGNOSIS — F1290 Cannabis use, unspecified, uncomplicated: Secondary | ICD-10-CM | POA: Diagnosis not present

## 2018-08-01 DIAGNOSIS — O99324 Drug use complicating childbirth: Secondary | ICD-10-CM | POA: Diagnosis not present

## 2018-08-01 DIAGNOSIS — O368120 Decreased fetal movements, second trimester, not applicable or unspecified: Secondary | ICD-10-CM

## 2018-08-01 DIAGNOSIS — F419 Anxiety disorder, unspecified: Secondary | ICD-10-CM | POA: Insufficient documentation

## 2018-08-01 LAB — CBC WITH DIFFERENTIAL/PLATELET
Abs Immature Granulocytes: 0.09 10*3/uL — ABNORMAL HIGH (ref 0.00–0.07)
Basophils Absolute: 0 10*3/uL (ref 0.0–0.1)
Basophils Relative: 0 %
Eosinophils Absolute: 0.1 10*3/uL (ref 0.0–0.5)
Eosinophils Relative: 1 %
HCT: 33 % — ABNORMAL LOW (ref 36.0–46.0)
Hemoglobin: 11.6 g/dL — ABNORMAL LOW (ref 12.0–15.0)
Immature Granulocytes: 1 %
Lymphocytes Relative: 26 %
Lymphs Abs: 3 10*3/uL (ref 0.7–4.0)
MCH: 34 pg (ref 26.0–34.0)
MCHC: 35.2 g/dL (ref 30.0–36.0)
MCV: 96.8 fL (ref 80.0–100.0)
Monocytes Absolute: 0.9 10*3/uL (ref 0.1–1.0)
Monocytes Relative: 7 %
Neutro Abs: 7.5 10*3/uL (ref 1.7–7.7)
Neutrophils Relative %: 65 %
Platelets: 183 10*3/uL (ref 150–400)
RBC: 3.41 MIL/uL — ABNORMAL LOW (ref 3.87–5.11)
RDW: 12.9 % (ref 11.5–15.5)
WBC: 11.6 10*3/uL — ABNORMAL HIGH (ref 4.0–10.5)
nRBC: 0 % (ref 0.0–0.2)

## 2018-08-01 LAB — CBC
Hematocrit: 29.8 % — ABNORMAL LOW (ref 34.0–46.6)
Hemoglobin: 10.4 g/dL — ABNORMAL LOW (ref 11.1–15.9)
MCH: 34 pg — ABNORMAL HIGH (ref 26.6–33.0)
MCHC: 34.9 g/dL (ref 31.5–35.7)
MCV: 97 fL (ref 79–97)
Platelets: 168 10*3/uL (ref 150–450)
RBC: 3.06 x10E6/uL — ABNORMAL LOW (ref 3.77–5.28)
RDW: 12.1 % (ref 11.7–15.4)
WBC: 9.8 10*3/uL (ref 3.4–10.8)

## 2018-08-01 LAB — URINE DRUG SCREEN, QUALITATIVE (ARMC ONLY)
Amphetamines, Ur Screen: NOT DETECTED
Barbiturates, Ur Screen: NOT DETECTED
Benzodiazepine, Ur Scrn: NOT DETECTED
Cannabinoid 50 Ng, Ur ~~LOC~~: POSITIVE — AB
Cocaine Metabolite,Ur ~~LOC~~: NOT DETECTED
MDMA (Ecstasy)Ur Screen: NOT DETECTED
Methadone Scn, Ur: NOT DETECTED
Opiate, Ur Screen: NOT DETECTED
Phencyclidine (PCP) Ur S: NOT DETECTED
Tricyclic, Ur Screen: NOT DETECTED

## 2018-08-01 LAB — URINALYSIS, COMPLETE (UACMP) WITH MICROSCOPIC
Bilirubin Urine: NEGATIVE
Glucose, UA: NEGATIVE mg/dL
Hgb urine dipstick: NEGATIVE
Ketones, ur: 5 mg/dL — AB
Leukocytes,Ua: NEGATIVE
Nitrite: NEGATIVE
Protein, ur: NEGATIVE mg/dL
Specific Gravity, Urine: 1.006 (ref 1.005–1.030)
pH: 7 (ref 5.0–8.0)

## 2018-08-01 LAB — COMPREHENSIVE METABOLIC PANEL
ALT: 13 U/L (ref 0–44)
AST: 22 U/L (ref 15–41)
Albumin: 3.5 g/dL (ref 3.5–5.0)
Alkaline Phosphatase: 121 U/L (ref 38–126)
Anion gap: 11 (ref 5–15)
BUN: 7 mg/dL (ref 6–20)
CO2: 17 mmol/L — ABNORMAL LOW (ref 22–32)
Calcium: 8.6 mg/dL — ABNORMAL LOW (ref 8.9–10.3)
Chloride: 106 mmol/L (ref 98–111)
Creatinine, Ser: 0.41 mg/dL — ABNORMAL LOW (ref 0.44–1.00)
GFR calc Af Amer: >60 mL/min (ref >60)
GFR calc non Af Amer: >60 mL/min (ref >60)
Glucose, Bld: 111 mg/dL — ABNORMAL HIGH (ref 70–99)
Potassium: 2.4 mmol/L — CL (ref 3.5–5.1)
Sodium: 134 mmol/L — ABNORMAL LOW (ref 135–145)
Total Bilirubin: 0.4 mg/dL (ref 0.3–1.2)
Total Protein: 7.1 g/dL (ref 6.5–8.1)

## 2018-08-01 LAB — TROPONIN I (HIGH SENSITIVITY): Troponin I (High Sensitivity): 6 ng/L (ref <18)

## 2018-08-01 LAB — GLUCOSE, 1 HOUR GESTATIONAL: Gestational Diabetes Screen: 94 mg/dL (ref 65–139)

## 2018-08-01 LAB — MAGNESIUM: Magnesium: 1.7 mg/dL (ref 1.7–2.4)

## 2018-08-01 LAB — RPR: RPR Ser Ql: NONREACTIVE

## 2018-08-01 MED ORDER — SODIUM CHLORIDE 0.9 % IV BOLUS
1000.0000 mL | Freq: Once | INTRAVENOUS | Status: AC
  Administered 2018-08-01: 1000 mL via INTRAVENOUS

## 2018-08-01 MED ORDER — FUSION PLUS PO CAPS: 1.0000 | ORAL_CAPSULE | Freq: Every day | ORAL | 6 refills | Status: DC

## 2018-08-01 MED ORDER — POTASSIUM CHLORIDE 20 MEQ/15ML (10%) PO SOLN
40.0000 meq | Freq: Once | ORAL | Status: AC
  Administered 2018-08-01: 40 meq via ORAL
  Filled 2018-08-01: qty 30

## 2018-08-01 NOTE — ED Provider Notes (Signed)
[facility] Emergency Department Provider Note   ____________________________________________   First MD Initiated Contact with Patient 08/01/18 2012     (approximate)  I have reviewed the triage vital signs and the nursing notes.   HISTORY  Chief Complaint Palpitations History limited by extreme anxiety and agitation HPI Einar CrowKayla Duck is a 28 y.o. female she reports she was smoking marijuana about a gram.  [place]  Then she got palpitations and panic and came in here.  She is currently shaking and crying with a heart rate in the 120s to 130.  She has a cochlear implant.         Past Medical History:  Diagnosis Date   Bipolar 1 disorder (HCC)    Borderline personality disorder (HCC)    Cochlear implant in place    Hearing loss    Interstitial cystitis    Mental disorder    Borderline personalities disorder   Oliguria 2013   cystoscopy, hydrodilation under anesthesia   Polysubstance abuse (HCC)    Vaginal Pap smear, abnormal     Patient Active Problem List   Diagnosis Date Noted   Palpitations 07/04/2018   Orthostatic lightheadedness 07/04/2018   Syncope and collapse 07/03/2018   Pelvic pain affecting pregnancy in second trimester, antepartum 07/03/2018   History of chlamydia 07/02/2018   Desires VBAC (vaginal birth after cesarean) trial 07/02/2018   Hearing loss 03/06/2018   Previous cesarean section 03/06/2018   History of VBAC 03/06/2018   Abnormal uterine bleeding 03/16/2011    Past Surgical History:  Procedure Laterality Date   Calibration of urethra with dilation of the urethra under anesthesia  04/04/2011   Dr. Rosezetta SchlatterJohn Harman   CESAREAN SECTION  10/29/2010   Procedure: CESAREAN SECTION;  Surgeon: Lazaro ArmsLuther H Eure, MD;  Location: [facility];  Service: Gynecology;;   CESAREAN SECTION     COCHLEAR IMPLANT     COCHLEAR IMPLANT     cystoscopy, hydrodilation under anesthesia   04/04/2011    Prior to Admission medications   Medication Sig Start Date End Date Taking? Authorizing Provider  Iron-FA-B Cmp-C-Biot-Probiotic (FUSION PLUS) CAPS Take 1 tablet by mouth daily. 08/01/18   Doreene Burkehompson, Annie, CNM  Prenatal-DSS-FeCb-FeGl-FA (CITRANATAL BLOOM) 90-1 MG TABS Take 1 tablet by mouth daily. 06/05/18   Shambley, Melody N, CNM    Allergies Latex, Nsaids, and Tylenol [acetaminophen]  Family History  Problem Relation Age of Onset   Hypertension Father    Stroke Father    Hypertension Mother    Lung cancer Maternal Grandfather    Syncope episode Paternal Grandmother     Social History Social History   Tobacco Use   Smoking status: Former Smoker    Packs/day: 0.25    Years: 8.00    Pack years: 2.00    Types: Cigarettes    Quit date: 01/02/2018    Years since quitting: 0.5   Smokeless tobacco: Never Used  Substance Use Topics   Alcohol use: Not Currently    Alcohol/week: 0.0 standard drinks   Drug use: No    Review of Systems  Unable to obtain  ____________________________________________   PHYSICAL EXAM:  VITAL SIGNS: ED Triage Vitals [08/01/18 2005]  Enc Vitals Group     BP 130/65     Pulse Rate (!) 128     Resp (!) 24     Temp 99.5 F (37.5 C)     Temp Source Oral  SpO2 97 %     Weight      Height      Head Circumference      Peak Flow      Pain Score      Pain Loc      Pain Edu?      Excl. in [place]?     Constitutional: Alert and oriented.  But crying and shaking and very anxious Eyes: Conjunctivae are normal. PER. EOMI. Head: Atraumatic. Nose: No congestion/rhinnorhea. Mouth/Throat: Mucous membranes are moist.  Oropharynx non-erythematous. Neck: No stridor.  Cardiovascular: Normal rate, regular rhythm. Grossly normal heart sounds.  Good peripheral circulation. Respiratory: Normal respiratory effort.  No retractions. Lungs CTAB. Gastrointestinal: Soft and nontender. No distention. No abdominal bruits. No CVA  tenderness.  Fetal heart rate is auscultated and is rapid but we are unable to get a count as patient continually moves as she comes down we will either repeat the fetal heart rate or get an ultrasound Musculoskeletal: No lower extremity tenderness nor edema.  Neurologic:  Normal speech and language accounting for anxiety and cochlear implant. No gross focal neurologic deficits are appreciated.  Skin:  Skin is warm, dry and intact. No rash noted.   ____________________________________________   LABS (all labs ordered are listed, but only abnormal results are displayed)  Labs Reviewed  URINALYSIS, COMPLETE (UACMP) WITH MICROSCOPIC - Abnormal; Notable for the following components:      Result Value   Color, Urine STRAW (*)    APPearance CLEAR (*)    Ketones, ur 5 (*)    Bacteria, UA RARE (*)    All other components within normal limits  URINE DRUG SCREEN, QUALITATIVE ([facility] ONLY) - Abnormal; Notable for the following components:   Cannabinoid 50 Ng, Ur  POSITIVE (*)    All other components within normal limits  COMPREHENSIVE METABOLIC PANEL - Abnormal; Notable for the following components:   Sodium 134 (*)    Potassium 2.4 (*)    CO2 17 (*)    Glucose, Bld 111 (*)    Creatinine, Ser 0.41 (*)    Calcium 8.6 (*)    All other components within normal limits  CBC WITH DIFFERENTIAL/PLATELET - Abnormal; Notable for the following components:   WBC 11.6 (*)    RBC 3.41 (*)    Hemoglobin 11.6 (*)    HCT 33.0 (*)    Abs Immature Granulocytes 0.09 (*)    All other components within normal limits  MAGNESIUM  TROPONIN I (HIGH SENSITIVITY)  TROPONIN I (HIGH SENSITIVITY)   ____________________________________________  EKG  EKG read interpreted by me shows normal sinus rhythm rate of 122 nonspecific ST-T wave changes ____________________________________________  RADIOLOGY  ED MD interpretation  Official radiology report(s): US Ob Limited  Result Date: 08/01/2018 CLINICAL DATA:   Marijuana use, no fetal movements EXAM: LIMITED OBSTETRIC ULTRASOUND FINDINGS: Number of Fetuses: 1 Heart Rate:  136 bpm Movement: Visualized Presentation: Breech Placental Location: Anterior Previa: Absent Amniotic Fluid (Subjective):  Within normal limits. AFI:  cm BPD: 7.5 cm 29 w  6 d MATERNAL FINDINGS: Cervix:  Appears closed. Uterus/Adnexae: No abnormality visualized. IMPRESSION: Approximately 29 week 6 day intrauterine pregnancy. Fetal heart rate 136 beats per minute. No acute maternal findings. This exam is performed on an emergent basis and does not comprehensively evaluate fetal size, dating, or anatomy; follow-up complete OB US should be considered if further fetal assessment is warranted. Electronically Signed   By: Rolm Baptise M.D.   On: 08/01/2018 22:06  ____________________________________________   PROCEDURES  Procedure(s) performed (including Critical Care):  Procedures   ____________________________________________   INITIAL IMPRESSION / ASSESSMENT AND PLAN / ED COURSE ----------------------------------------- 10:56 PM on 08/01/2018 -----------------------------------------  Patient's ultrasound looks normal.  Patient is feeling better.  She is calm her heart rate is down to 87.  She looks good.  She feels good she has no complaints she wants to go home and I will let her go home.  She will return if she needs to. Patient did have a low-grade fever initially.  Urine is clear lungs are clear blood work is within normal limits I will let her go.              ____________________________________________   FINAL CLINICAL IMPRESSION(S) / ED DIAGNOSES  Final diagnoses:  Anxiety  Palpitations  Side effect of marijuana use.   ED Discharge Orders    None       Note:  This document was prepared using Dragon voice recognition software and may include unintentional dictation errors.    Arnaldo NatalMalinda, Haani Bakula F, MD 08/01/18 2258

## 2018-08-01 NOTE — Telephone Encounter (Signed)
Voicemail message left for pt to return call. 

## 2018-08-01 NOTE — ED Triage Notes (Signed)
Pt helped out of vehicle due to pt reporting that she started to have palpitations x30 minutes ago after smoking mariajuana x1 hour ago. Pt sts she is [redacted] weeks pregnant and has not felt the baby move today. [place] Pt denies other polysubstance abuse as well as ETOH.

## 2018-08-01 NOTE — Discharge Instructions (Addendum)
Please follow-up with your regular doctor as planned.  Please return for any further problems fever belly pain or any other thing that you are worried about.

## 2018-08-01 NOTE — ED Notes (Signed)
Pt ambulatory to toilet w/a steady gait.

## 2018-08-01 NOTE — ED Notes (Signed)
ED Provider Malinda  at bedside. 

## 2018-08-01 NOTE — ED Notes (Signed)
126 [facility]

## 2018-08-02 NOTE — Progress Notes (Signed)
Cardiology Office Note Date:  08/06/2018  Patient ID:  Tammy Snyder, DOB 1990/05/06, MRN [id number] PCP:  Martie RoundSpencer, Nicole, NP  Cardiologist:  Dr. Okey DupreEnd, MD    Chief Complaint: Follow up  History of Present Illness: Tammy Snyder is a 28 y.o. female with history of syncope, hearing impairment s/p cochlear implants, anxiety with panic attacks previously on clonazepam which has been held in the setting of current pregnancy, bipolar disorder on Depakote with relative hypotension and who is ~ 30 weeks gravid presents for follow up of syncope.   She was evaluated by Dr. Okey DupreEnd on 07/04/2018 for recurrent syncope felt to be in the setting of insufficient venous return secondary to prolonged standing exacerbated by physiologic changes in pregnancy and compression of the IVC by a gravid uterus. She indicated intermittent lightheadedness when standing dating back to her teenage years. However, she had never passed out up until 5 months ago. At the time of her visit with us in 06/2018, she was 6 months pregnant with her 3rd pregnancy. She noted she did not have any issues with syncope or near-syncope during her first two pregnancies. She noted if she stood in one spot for more than a few minutes she would begin to feel lightheaded. If she did not sit down quickly, she would lose her balance and pass out. She also noted palpitations with lightheadedness. Outpatient monitoring showed the patient was monitored for 2 days and 11 hours with a predominant rhythm of sinus with an average rate of 91 bpm (range 62-179 bpm), rare PACs and PVCs were noted with a single 4-beat run of NSVT, no sustained arrhythmia or prolonged pause was noted. Patient triggered events corresponded to NSR and sinus tachycardia. She was seen in the ED on 08/01/2018 with palpitations and panic while in the heat and smoking marijuana. EKG showed sinus tachycardia, 122 bpm, nonspecific st/t changes. Hs-Tn 6, urine drug screen positive for cannabinoid,  potassium 2.4, SCr 0.41, AST/ALT normal, albumin 3.5, WBC 11.6, HGB 11.6, PLT 183. OB ultrasound showed no acute findings.  She was seen by OB on 7/17 noting continued palpitations and confusion with dehydration [place] only having 1 Mountain Dew to drink all day.  She was given IV fluids and p.o. potassium with improvement in symptoms.  Echo was performed at that time which showed an EF of 60 to 65%, normal LV cavity size, normal LV diastolic function, normal RV systolic function, normal RV cavity size, mildly elevated RV systolic pressure 42.5 mmHg.  No significant valvular abnormality.  Labs checked at that time showed an improved potassium of 3.6 with a serum creatinine 0.31, glucose 106, albumin 3.0, AST 13, ALT 10.  She comes in today doing reasonably well.  She does continue to note palpitations though none recently.  She states when [place] the [place] she had a bottle of water to bring with her though forgot to bring this and ended up only [facility] without anything to eat or drink otherwise.  She denies any further syncopal episodes.  No chest pain or shortness of breath.  She is wearing knee-high compression stockings.  No orthopnea, PND, early satiety.   Past Medical History:  Diagnosis Date   Bipolar 1 disorder (HCC)    Borderline personality disorder (HCC)    Cochlear implant in place    Hearing loss    Interstitial cystitis    Mental disorder  Borderline personalities disorder   Oliguria 2013   cystoscopy, hydrodilation under anesthesia   Polysubstance abuse ([place])    Vaginal Pap smear, abnormal     Past Surgical History:  Procedure Laterality Date   Calibration of urethra with dilation of the urethra under anesthesia  04/04/2011   Dr. Charyl Dancer   CESAREAN SECTION  10/29/2010   Procedure: CESAREAN SECTION;  Surgeon: Florian Buff, MD;  Location: [facility];  Service: Gynecology;;    [place]     cystoscopy, hydrodilation under anesthesia  04/04/2011    No outpatient medications have been marked as taking for the 08/06/18 encounter (Office Visit) with Rise Mu, PA-C.    Allergies:   Latex, Nsaids, and Tylenol [acetaminophen]   Social History:  The patient  reports that she quit smoking about 7 months ago. Her smoking use included cigarettes. She has a 2.00 pack-year smoking history. She has never used smokeless tobacco. She reports previous alcohol use. She reports that she does not use drugs.   Family History:  The patient's family history includes Hypertension in her father and mother; Lung cancer in her maternal grandfather; Stroke in her father; Syncope episode in her paternal grandmother.  ROS:   Review of Systems  Constitutional: Positive for malaise/fatigue. Negative for chills, diaphoresis, fever and weight loss.  HENT: Negative for congestion.   Eyes: Negative for discharge and redness.  Respiratory: Negative for cough, hemoptysis, sputum production, shortness of breath and wheezing.   Cardiovascular: Positive for palpitations. Negative for chest pain, orthopnea, claudication, leg swelling and PND.  Gastrointestinal: Negative for abdominal pain, blood in stool, heartburn, melena, nausea and vomiting.  Genitourinary: Negative for hematuria.  Musculoskeletal: Negative for falls and myalgias.  Skin: Negative for rash.  Neurological: Negative for dizziness, tingling, tremors, sensory change, speech change, focal weakness, loss of consciousness and weakness.  Endo/Heme/Allergies: Does not bruise/bleed easily.  Psychiatric/Behavioral: Positive for substance abuse. The patient is nervous/anxious.   All other systems reviewed and are negative.    PHYSICAL EXAM:  VS:  BP 100/68 (BP Location: Left Arm, Patient Position: Sitting, Cuff Size: Normal)    Pulse 77    Temp 98.6 F (37 C)    Ht 5' 0.05" (1.525 m)     Wt 151 lb 4 oz (68.6 kg)    LMP 01/11/2018 (Approximate)    SpO2 98%    BMI 29.49 kg/m  BMI: Body mass index is 29.49 kg/m.  Physical Exam  Constitutional: She is oriented to person, place, and time. She appears well-developed and well-nourished.  HENT:  Head: Normocephalic and atraumatic.  Eyes: Right eye exhibits no discharge. Left eye exhibits no discharge.  Neck: Normal range of motion. No JVD present.  Cardiovascular: Normal rate, regular rhythm, S1 normal and S2 normal. Exam reveals no distant heart sounds, no friction rub, no midsystolic click and no opening snap.  Murmur heard. Pulses:      Posterior tibial pulses are 2+ on the right side and 2+ on the left side.  I/VI systolic murmur at the base  Pulmonary/Chest: Effort normal and breath sounds normal. No respiratory distress. She has no decreased breath sounds. She has no wheezes. She has no rales. She exhibits no tenderness.  Abdominal: Soft. She exhibits no distension. There is no abdominal tenderness.  Musculoskeletal:        General: No edema.  Neurological: She is alert and oriented to person, place, and  time.  Skin: Skin is warm and dry. No cyanosis. Nails show no clubbing.  Psychiatric: She has a normal mood and affect. Her speech is normal and behavior is normal. Judgment and thought content normal.     EKG:  Was ordered and interpreted by me today. Shows NSR, 77 bpm, normal axis, no acute ST-T changes  Recent Labs: 08/01/2018: Hemoglobin 11.6; Magnesium 1.7; Platelets 183 08/03/2018: ALT 10; BUN 7; Creatinine, Ser 0.31; Potassium 3.6; Sodium 139  No results found for requested labs within last 8760 hours.   Estimated Creatinine Clearance: 91.4 mL/min (A) (by C-G formula based on SCr of 0.31 mg/dL (L)).   Wt Readings from Last 3 Encounters:  08/06/18 151 lb 4 oz (68.6 kg)  07/31/18 154 lb (69.9 kg)  07/04/18 148 lb 8 oz (67.4 kg)     Other studies reviewed: Additional studies/records reviewed today include:  summarized above  ASSESSMENT AND PLAN:  1. Orthostatic lightheadedness/palpitations/syncope: Cardiac work-up including outpatient cardiac monitoring and echo have been unrevealing.  EKG is normal.  BP stable though on the soft side today, which she states is her baseline.  Symptoms and history seem to correspond with orthostatic dizziness/lightheadedness in the setting of underlying dehydration, poor p.o. intake, and gravid state and likely exacerbated by ongoing cannabis use.  Given current gravid state and noted orthostatic lightheadedness would not recommend addition of medication for palpitations/isolated PACs/PVCs with rare 4 beat run of NSVT at this time.  EF normal by echo as above.  No further cardiac testing at this time.  Recommend obtaining TSH and BMP.  Recommend adequate hydration and discontinuation of cannabis as well as caffeine.  Continue with compression stockings.  2. Hypokalemia: Likely exacerbated by recent diarrhea.  Most recent CMP demonstrated improved potassium.  Remains on KCl 10 mEq twice daily.  Check BMP.  3. Anxiety/panic disorder: Likely contributing to her overall presentation.  Per PCP/OB.  4. Substance abuse: Complete cessation is recommended.   Disposition: F/u with Dr. Okey DupreEnd or an APP PRN.  Current medicines are reviewed at length with the patient today.  The patient did not have any concerns regarding medicines.  Signed, Eula Listenyan Austin Pongratz, PA-C 08/06/2018 2:26 PM     [facility] [address] [place], [place] [address] [phone]

## 2018-08-03 ENCOUNTER — Other Ambulatory Visit: Payer: Medicaid Other

## 2018-08-03 ENCOUNTER — Observation Stay
Admission: EM | Admit: 2018-08-03 | Discharge: 2018-08-03 | Disposition: A | Discharge status: Home / Self Care | Payer: Medicaid Other | Attending: Certified Nurse Midwife | Admitting: Certified Nurse Midwife

## 2018-08-03 ENCOUNTER — Observation Stay (HOSPITAL_BASED_OUTPATIENT_CLINIC_OR_DEPARTMENT_OTHER)
Admit: 2018-08-03 | Discharge: 2018-08-03 | Disposition: A | Discharge status: Home / Self Care | Payer: Medicaid Other | Attending: Cardiovascular Disease | Admitting: Cardiovascular Disease

## 2018-08-03 DIAGNOSIS — Z3490 Encounter for supervision of normal pregnancy, unspecified, unspecified trimester: Secondary | ICD-10-CM

## 2018-08-03 DIAGNOSIS — O26893 Other specified pregnancy related conditions, third trimester: Principal | ICD-10-CM | POA: Insufficient documentation

## 2018-08-03 DIAGNOSIS — F603 Borderline personality disorder: Secondary | ICD-10-CM | POA: Insufficient documentation

## 2018-08-03 DIAGNOSIS — E860 Dehydration: Secondary | ICD-10-CM | POA: Diagnosis not present

## 2018-08-03 DIAGNOSIS — F319 Bipolar disorder, unspecified: Secondary | ICD-10-CM | POA: Diagnosis not present

## 2018-08-03 DIAGNOSIS — R002 Palpitations: Secondary | ICD-10-CM | POA: Diagnosis not present

## 2018-08-03 DIAGNOSIS — O99343 Other mental disorders complicating pregnancy, third trimester: Secondary | ICD-10-CM | POA: Insufficient documentation

## 2018-08-03 DIAGNOSIS — Z3A28 28 weeks gestation of pregnancy: Secondary | ICD-10-CM | POA: Diagnosis not present

## 2018-08-03 DIAGNOSIS — I361 Nonrheumatic tricuspid (valve) insufficiency: Secondary | ICD-10-CM | POA: Diagnosis not present

## 2018-08-03 LAB — COMPREHENSIVE METABOLIC PANEL
ALT: 10 U/L (ref 0–44)
AST: 13 U/L — ABNORMAL LOW (ref 15–41)
Albumin: 3 g/dL — ABNORMAL LOW (ref 3.5–5.0)
Alkaline Phosphatase: 96 U/L (ref 38–126)
Anion gap: 8 (ref 5–15)
BUN: 7 mg/dL (ref 6–20)
CO2: 21 mmol/L — ABNORMAL LOW (ref 22–32)
Calcium: 8.4 mg/dL — ABNORMAL LOW (ref 8.9–10.3)
Chloride: 110 mmol/L (ref 98–111)
Creatinine, Ser: 0.31 mg/dL — ABNORMAL LOW (ref 0.44–1.00)
GFR calc Af Amer: >60 mL/min (ref >60)
GFR calc non Af Amer: >60 mL/min (ref >60)
Glucose, Bld: 106 mg/dL — ABNORMAL HIGH (ref 70–99)
Potassium: 3.6 mmol/L (ref 3.5–5.1)
Sodium: 139 mmol/L (ref 135–145)
Total Bilirubin: 0.2 mg/dL — ABNORMAL LOW (ref 0.3–1.2)
Total Protein: 5.8 g/dL — ABNORMAL LOW (ref 6.5–8.1)

## 2018-08-03 LAB — URINALYSIS, COMPLETE (UACMP) WITH MICROSCOPIC
Bacteria, UA: NONE SEEN
Bilirubin Urine: NEGATIVE
Glucose, UA: NEGATIVE mg/dL
Hgb urine dipstick: NEGATIVE
Ketones, ur: NEGATIVE mg/dL
Nitrite: NEGATIVE
Protein, ur: NEGATIVE mg/dL
Specific Gravity, Urine: 1.013 (ref 1.005–1.030)
pH: 8 (ref 5.0–8.0)

## 2018-08-03 LAB — ECHOCARDIOGRAM COMPLETE

## 2018-08-03 MED ORDER — POTASSIUM CHLORIDE ER 10 MEQ PO TBCR: 10.0000 meq | EXTENDED_RELEASE_TABLET | Freq: Two times a day (BID) | ORAL | 1 refills | Status: DC

## 2018-08-03 MED ORDER — KCL-LACTATED RINGERS-D5W 20 MEQ/L IV SOLN
INTRAVENOUS | Status: DC
  Administered 2018-08-03: 08:00:00 via INTRAVENOUS
  Filled 2018-08-03 (×4): qty 1000

## 2018-08-03 NOTE — OB Triage Provider Note (Signed)
Tammy Snyder is a 28 y.o. G3P2002 at [redacted]w[redacted]d who is admitted for observation and IVF, K+ replacement. Tammy Snyder presented last night with c/o feeling confused and dehydrated after [place] Only had Mt Dew to drink all day. Was seen in ED on the 15th(please see their notes) and was given po K+ and a IV infusion of saline and sent home. Didn't feel any better and also didn't feel the baby move all day so came back in. .  Estimated Date of Delivery: 10/21/18 Fetal presentation is unsure.  Length of Stay:  0 Days. Admitted 08/03/2018  Subjective: Feels weak and confused at times. Was sleeping when I entered the room, states it was the first time she has been able to sleep in a while. Does report palpitations at time. Patient reports decreased fetal movement.  She reports no known uterine contractions, no bleeding and no loss of fluid per vagina.  Vitals:  Blood pressure 113/66, pulse 79, temperature 98.4 F (36.9 C), temperature source Oral, resp. rate 18, last menstrual period 01/11/2018. Physical Examination: CONSTITUTIONAL: Well-developed, well-nourished female in no acute distress. Slightly drowsy. SKIN: Skin is warm and dry. No rash noted. Not diaphoretic. No erythema. No pallor. Sun burn noted on chest and abdomen. No blistering NEUROLGIC: Alert and oriented to person, place, and time. Normal reflexes, muscle tone coordination. No cranial nerve deficit noted. PSYCHIATRIC: Normal mood and affect. Normal behavior. Normal judgment and thought content. CARDIOVASCULAR: Normal heart rate noted, regular rhythm RESPIRATORY: Effort and breath sounds normal, no problems with respiration noted MUSCULOSKELETAL: Normal range of motion. No edema and no tenderness. 2+ distal pulses. ABDOMEN: Soft, nontender, nondistended, gravid. CERVIX:  not checked  Fetal monitoring: FHR: 158 bpm, Variability: moderate, Accelerations: Present, Decelerations: Absent NST on admission  reactive Uterine activity: no contractions per hour  Results for orders placed or performed during the hospital encounter of 08/03/18 (from the past 48 hour(s))  Urinalysis, Complete w Microscopic     Status: Abnormal   Collection Time: 08/03/18  4:54 AM  Result Value Ref Range   Color, Urine YELLOW (A) YELLOW   APPearance CLOUDY (A) CLEAR   Specific Gravity, Urine 1.013 1.005 - 1.030   pH 8.0 5.0 - 8.0   Glucose, UA NEGATIVE NEGATIVE mg/dL   Hgb urine dipstick NEGATIVE NEGATIVE   Bilirubin Urine NEGATIVE NEGATIVE   Ketones, ur NEGATIVE NEGATIVE mg/dL   Protein, ur NEGATIVE NEGATIVE mg/dL   Nitrite NEGATIVE NEGATIVE   Leukocytes,Ua TRACE (A) NEGATIVE   WBC, UA 6-10 0 - 5 WBC/hpf   Bacteria, UA NONE SEEN NONE SEEN   Squamous Epithelial / LPF 6-10 0 - 5   Mucus PRESENT    Sperm, UA PRESENT     Comment: Performed at [facility], [address] [place], [place] [address]    US Ob Limited  Result Date: 08/01/2018 CLINICAL DATA:  Marijuana use, no fetal movements EXAM: LIMITED OBSTETRIC ULTRASOUND FINDINGS: Number of Fetuses: 1 Heart Rate:  136 bpm Movement: Visualized Presentation: Breech Placental Location: Anterior Previa: Absent Amniotic Fluid (Subjective):  Within normal limits. AFI:  cm BPD: 7.5 cm 29 w  6 d MATERNAL FINDINGS: Cervix:  Appears closed. Uterus/Adnexae: No abnormality visualized. IMPRESSION: Approximately 29 week 6 day intrauterine pregnancy. Fetal heart rate 136 beats per minute. No acute maternal findings. This exam is performed on an emergent basis and does not comprehensively evaluate fetal size, dating, or anatomy; follow-up complete OB US should be considered  if further fetal assessment is warranted. Electronically Signed   By: Charlett NoseKevin  Dover M.D.   On: 08/01/2018 22:06    Current scheduled medications   I have reviewed the patient's current medications.  ASSESSMENT: Patient Active Problem List   Diagnosis Date Noted  . Pregnancy 08/03/2018  .  Palpitations 07/04/2018  . Orthostatic lightheadedness 07/04/2018  . Syncope and collapse 07/03/2018  . Pelvic pain affecting pregnancy in second trimester, antepartum 07/03/2018  . History of chlamydia 07/02/2018  . Desires VBAC (vaginal birth after cesarean) trial 07/02/2018  . Hearing loss 03/06/2018  . Previous cesarean section 03/06/2018  . History of VBAC 03/06/2018  . Abnormal uterine bleeding 03/16/2011    PLAN: Will keep and give 1 liter IV D5LR with 20mEq KCL. Regular diet, encouraged to po hydrate and rest. Has a cardiology appt today at 1 for echo, will notify them and see if they want to do it here while she is in the hospital or reschedule. Will repeat MetC after IVF infused.  Pattricia Bossnnie Thompson,CNM will assume care this am.  Continue routine antenatal   Brinlynn Gorton N Arnisha Laffoon, CNM [facility]

## 2018-08-03 NOTE — Progress Notes (Signed)
*  PRELIMINARY RESULTS* Echocardiogram 2D Echocardiogram has been performed.  Tammy Snyder 08/03/2018, 1:20 PM

## 2018-08-03 NOTE — OB Triage Note (Signed)
Patient came in c/o of "sun poisoning" and decrease FM. Patient stated her main concern was the symptoms of sun poisoning. [place]  [place] and feeling dizzy, confused and dehydrated. Patient also reports having diarrhea today. Patient also reports decrease fetal movement all day today, last felt baby move this morning. Patient denies vaginal bleeding. Denies LOF. Fetal HR in the 140s with active fetal movements noted by RN. Patient skin appears sun burt no blisters notes. Vitals signs WNL. Will notify provider.

## 2018-08-03 NOTE — OB Triage Note (Signed)
                            Discharge Summary  Date of observation: 08/03/2018  Date of observation Discharge: 08/03/2018  Admitting Diagnosis: IVF hydration and K+ replacement therapy at [redacted]w[redacted]d   Discharge Diagnosis: same   Intrapartum Procedures: IVF hydration and replacment potassium   Complications: none                    Data Review Labs: CBC Latest Ref Rng & Units 08/01/2018 07/31/2018 04/10/2018  WBC 4.0 - 10.5 K/uL 11.6(H) 9.8 12.9(H)  Hemoglobin 12.0 - 15.0 g/dL 11.6(L) 10.4(L) 12.0  Hematocrit 36.0 - 46.0 % 33.0(L) 29.8(L) 35.2(L)  Platelets 150 - 400 K/uL 183 168 171   NST: reactive Category 1 strip   Baseline 140's Accelerations present Decelerations absent Contractions: occasional   Assessment/Plan  Active Problems:   Pregnancy    Plan for discharge today.   Discharge Instructions: Per After Visit Summary. Activity: Advance as tolerated..  Also refer to After Visit Summary Diet: Regular Medications: Allergies as of 08/03/2018      Reactions   Latex Rash   Nsaids Rash   Tylenol [acetaminophen] Itching, Rash      Medication List    TAKE these medications   CitraNatal Bloom 90-1 MG Tabs Take 1 tablet by mouth daily.   Fusion Plus Caps Take 1 tablet by mouth daily.   potassium chloride 10 MEQ tablet Commonly known as: K-DUR Take 1 tablet (10 mEq total) by mouth 2 (two) times daily.      Outpatient follow up: As scheduled or PRN.    Discharged Condition: good  Discharged to: home    Philip Aspen, [place]  08/03/2018 4:31 PM

## 2018-08-06 ENCOUNTER — Ambulatory Visit (INDEPENDENT_AMBULATORY_CARE_PROVIDER_SITE_OTHER): Payer: Medicaid Other | Admitting: Physician Assistant

## 2018-08-06 ENCOUNTER — Encounter: Payer: Self-pay | Admitting: Physician Assistant

## 2018-08-06 ENCOUNTER — Telehealth: Payer: Self-pay

## 2018-08-06 ENCOUNTER — Other Ambulatory Visit: Payer: Self-pay

## 2018-08-06 VITALS — BP 100/68 | HR 77 | Temp 98.6°F | Ht 60.05 in | Wt 151.2 lb

## 2018-08-06 DIAGNOSIS — R55 Syncope and collapse: Secondary | ICD-10-CM | POA: Diagnosis not present

## 2018-08-06 DIAGNOSIS — R002 Palpitations: Secondary | ICD-10-CM | POA: Diagnosis not present

## 2018-08-06 DIAGNOSIS — R42 Dizziness and giddiness: Secondary | ICD-10-CM | POA: Diagnosis not present

## 2018-08-06 DIAGNOSIS — E876 Hypokalemia: Secondary | ICD-10-CM | POA: Diagnosis not present

## 2018-08-06 NOTE — Telephone Encounter (Signed)
Voicemail message left to return call.

## 2018-08-06 NOTE — Patient Instructions (Signed)
Medication Instructions:  Your physician recommends that you continue on your current medications as directed. Please refer to the Current Medication list given to you today. If you need a refill on your cardiac medications before your next appointment, please call your pharmacy.   Lab work: Your physician recommends that you have lab work today(BMET, TSH)  If you have labs (blood work) drawn today and your tests are completely normal, you will receive your results only by: Marland Kitchen MyChart Message (if you have MyChart) OR . A paper copy in the mail If you have any lab test that is abnormal or we need to change your treatment, we will call you to review the results.  Testing/Procedures: None ordered   Follow-Up: At [facility], you and your health needs are our priority.  As part of our continuing mission to provide you with exceptional heart care, we have created designated Provider Care Teams.  These Care Teams include your primary Cardiologist (physician) and Advanced Practice Providers (APPs -  Physician Assistants and Nurse Practitioners) who all work together to provide you with the care you need, when you need it. You will need a follow up appointment as needed.

## 2018-08-06 NOTE — Telephone Encounter (Signed)
Voicemail message left for pt to return my call. 

## 2018-08-07 ENCOUNTER — Telehealth: Payer: Self-pay

## 2018-08-07 ENCOUNTER — Other Ambulatory Visit: Payer: Self-pay

## 2018-08-07 LAB — BASIC METABOLIC PANEL
BUN/Creatinine Ratio: 19 (ref 9–23)
BUN: 7 mg/dL (ref 6–20)
CO2: 15 mmol/L — ABNORMAL LOW (ref 20–29)
Calcium: 8.8 mg/dL (ref 8.7–10.2)
Chloride: 105 mmol/L (ref 96–106)
Creatinine, Ser: 0.36 mg/dL — ABNORMAL LOW (ref 0.57–1.00)
GFR calc Af Amer: 171 mL/min/{1.73_m2} (ref >59)
GFR calc non Af Amer: 148 mL/min/{1.73_m2} (ref >59)
Glucose: 106 mg/dL — ABNORMAL HIGH (ref 65–99)
Potassium: 3.3 mmol/L — ABNORMAL LOW (ref 3.5–5.2)
Sodium: 135 mmol/L (ref 134–144)

## 2018-08-07 LAB — TSH: TSH: 0.853 u[IU]/mL (ref 0.450–4.500)

## 2018-08-07 MED ORDER — PRENATAL VITAMINS 28-0.8 MG PO TABS: 1.0000 | ORAL_TABLET | Freq: Every day | ORAL | 4 refills | Status: DC

## 2018-08-07 NOTE — Telephone Encounter (Signed)
Contacted the telephone number listed for the pt. Left a message with the person who answered the phone to have the pt contact our office.

## 2018-08-07 NOTE — Telephone Encounter (Signed)
Refill prenatal vits sent to pharmacy

## 2018-08-07 NOTE — Telephone Encounter (Signed)
-----   Message from Rise Mu, PA-C sent at 08/07/2018  7:05 AM EDT ----- Thyroid function normal.  Renal function normal.  Random glucose ok.  Potassium remains low.   -Please have her take an additional KCl 40 mEq bid x 1 day then resume 20 mEq bid thereafter -She should follow up with PCP or OB in a week to repeat bmet

## 2018-08-09 NOTE — Telephone Encounter (Signed)
No answer or VM on patient's number.  Attempted to reach grandmother listed on DPR. No answer. Left message to call back.

## 2018-08-10 NOTE — Telephone Encounter (Signed)
Attempted to call the patient at [phone]. No answer & no voice mail.  Attempted to call the patient's grandmother per DPR at [phone]. No answer- I left a message to please call back.

## 2018-08-14 ENCOUNTER — Other Ambulatory Visit (HOSPITAL_COMMUNITY)
Admission: RE | Admit: 2018-08-14 | Discharge: 2018-08-14 | Disposition: A | Discharge status: Home / Self Care | Payer: Medicaid Other | Source: Ambulatory Visit | Attending: Certified Nurse Midwife | Admitting: Certified Nurse Midwife

## 2018-08-14 ENCOUNTER — Other Ambulatory Visit: Payer: Self-pay

## 2018-08-14 ENCOUNTER — Ambulatory Visit (INDEPENDENT_AMBULATORY_CARE_PROVIDER_SITE_OTHER): Payer: Medicaid Other | Admitting: Certified Nurse Midwife

## 2018-08-14 VITALS — BP 99/55 | HR 80 | Wt 154.5 lb

## 2018-08-14 DIAGNOSIS — O26893 Other specified pregnancy related conditions, third trimester: Secondary | ICD-10-CM | POA: Diagnosis present

## 2018-08-14 DIAGNOSIS — E876 Hypokalemia: Secondary | ICD-10-CM

## 2018-08-14 DIAGNOSIS — N898 Other specified noninflammatory disorders of vagina: Secondary | ICD-10-CM | POA: Diagnosis present

## 2018-08-14 DIAGNOSIS — Z3493 Encounter for supervision of normal pregnancy, unspecified, third trimester: Secondary | ICD-10-CM | POA: Insufficient documentation

## 2018-08-14 NOTE — Telephone Encounter (Signed)
3 attempts made to contact the patient. Letter mailed for the patient to contact the office for results.

## 2018-08-14 NOTE — Progress Notes (Signed)
ROB-Patient c/o thin clear vaginal discharge x 2 weeks.

## 2018-08-14 NOTE — Patient Instructions (Signed)
Breastfeeding  Choosing to breastfeed is one of the best decisions you can make for yourself and your baby. A change in hormones during pregnancy causes your breasts to make breast milk in your milk-producing glands. Hormones prevent breast milk from being released before your baby is born. They also prompt milk flow after birth. Once breastfeeding has begun, thoughts of your baby, as well as his or her sucking or crying, can stimulate the release of milk from your milk-producing glands. Benefits of breastfeeding Research shows that breastfeeding offers many health benefits for infants and mothers. It also offers a cost-free and convenient way to feed your baby. For your baby  Your first milk (colostrum) helps your baby's digestive system to function better.  Special cells in your milk (antibodies) help your baby to fight off infections.  Breastfed babies are less likely to develop asthma, allergies, obesity, or type 2 diabetes. They are also at lower risk for sudden infant death syndrome (SIDS).  Nutrients in breast milk are better able to meet your babys needs compared to infant formula.  Breast milk improves your baby's brain development. For you  Breastfeeding helps to create a very special bond between you and your baby.  Breastfeeding is convenient. Breast milk costs nothing and is always available at the correct temperature.  Breastfeeding helps to burn calories. It helps you to lose the weight that you gained during pregnancy.  Breastfeeding makes your uterus return faster to its size before pregnancy. It also slows bleeding (lochia) after you give birth.  Breastfeeding helps to lower your risk of developing type 2 diabetes, osteoporosis, rheumatoid arthritis, cardiovascular disease, and breast, ovarian, uterine, and endometrial cancer later in life. Breastfeeding basics Starting breastfeeding  Find a comfortable place to sit or lie down, with your neck and back  well-supported.  Place a pillow or a rolled-up blanket under your baby to bring him or her to the level of your breast (if you are seated). Nursing pillows are specially designed to help support your arms and your baby while you breastfeed.  Make sure that your baby's tummy (abdomen) is facing your abdomen.  Gently massage your breast. With your fingertips, massage from the outer edges of your breast inward toward the nipple. This encourages milk flow. If your milk flows slowly, you may need to continue this action during the feeding.  Support your breast with 4 fingers underneath and your thumb above your nipple (make the letter "C" with your hand). Make sure your fingers are well away from your nipple and your babys mouth.  Stroke your baby's lips gently with your finger or nipple.  When your baby's mouth is open wide enough, quickly bring your baby to your breast, placing your entire nipple and as much of the areola as possible into your baby's mouth. The areola is the colored area around your nipple. ? More areola should be visible above your baby's upper lip than below the lower lip. ? Your baby's lips should be opened and extended outward (flanged) to ensure an adequate, comfortable latch. ? Your baby's tongue should be between his or her lower gum and your breast.  Make sure that your baby's mouth is correctly positioned around your nipple (latched). Your baby's lips should create a seal on your breast and be turned out (everted).  It is common for your baby to suck about 2-3 minutes in order to start the flow of breast milk. Latching Teaching your baby how to latch onto your breast  properly is very important. An improper latch can cause nipple pain, decreased milk supply, and poor weight gain in your baby. Also, if your baby is not latched onto your nipple properly, he or she may swallow some air during feeding. This can make your baby fussy. Burping your baby when you switch breasts  during the feeding can help to get rid of the air. However, teaching your baby to latch on properly is still the best way to prevent fussiness from swallowing air while breastfeeding. Signs that your baby has successfully latched onto your nipple  Silent tugging or silent sucking, without causing you pain. Infant's lips should be extended outward (flanged).  Swallowing heard between every 3-4 sucks once your milk has started to flow (after your let-down milk reflex occurs).  Muscle movement above and in front of his or her ears while sucking. Signs that your baby has not successfully latched onto your nipple  Sucking sounds or smacking sounds from your baby while breastfeeding.  Nipple pain. If you think your baby has not latched on correctly, slip your finger into the corner of your babys mouth to break the suction and place it between your baby's gums. Attempt to start breastfeeding again. Signs of successful breastfeeding Signs from your baby  Your baby will gradually decrease the number of sucks or will completely stop sucking.  Your baby will fall asleep.  Your baby's body will relax.  Your baby will retain a small amount of milk in his or her mouth.  Your baby will let go of your breast by himself or herself. Signs from you  Breasts that have increased in firmness, weight, and size 1-3 hours after feeding.  Breasts that are softer immediately after breastfeeding.  Increased milk volume, as well as a change in milk consistency and color by the fifth day of breastfeeding.  Nipples that are not sore, cracked, or bleeding. Signs that your baby is getting enough milk  Wetting at least 1-2 diapers during the first 24 hours after birth.  Wetting at least 5-6 diapers every 24 hours for the first week after birth. The urine should be clear or pale yellow by the age of 5 days.  Wetting 6-8 diapers every 24 hours as your baby continues to grow and develop.  At least 3 stools in  a 24-hour period by the age of 5 days. The stool should be soft and yellow.  At least 3 stools in a 24-hour period by the age of 7 days. The stool should be seedy and yellow.  No loss of weight greater than 10% of birth weight during the first 3 days of life.  Average weight gain of 4-7 oz (113-198 g) per week after the age of 4 days.  Consistent daily weight gain by the age of 5 days, without weight loss after the age of 2 weeks. After a feeding, your baby may spit up a small amount of milk. This is normal. Breastfeeding frequency and duration Frequent feeding will help you make more milk and can prevent sore nipples and extremely full breasts (breast engorgement). Breastfeed when you feel the need to reduce the fullness of your breasts or when your baby shows signs of hunger. This is called "breastfeeding on demand." Signs that your baby is hungry include:  Increased alertness, activity, or restlessness.  Movement of the head from side to side.  Opening of the mouth when the corner of the mouth or cheek is stroked (rooting).  Increased sucking sounds, smacking  lips, cooing, sighing, or squeaking.  Hand-to-mouth movements and sucking on fingers or hands.  Fussing or crying. Avoid introducing a pacifier to your baby in the first 4-6 weeks after your baby is born. After this time, you may choose to use a pacifier. Research has shown that pacifier use during the first year of a baby's life decreases the risk of sudden infant death syndrome (SIDS). Allow your baby to feed on each breast as long as he or she wants. When your baby unlatches or falls asleep while feeding from the first breast, offer the second breast. Because newborns are often sleepy in the first few weeks of life, you may need to awaken your baby to get him or her to feed. Breastfeeding times will vary from baby to baby. However, the following rules can serve as a guide to help you make sure that your baby is properly  fed:  Newborns (babies 74 weeks of age or younger) may breastfeed every 1-3 hours.  Newborns should not go without breastfeeding for longer than 3 hours during the day or 5 hours during the night.  You should breastfeed your baby a minimum of 8 times in a 24-hour period. Breast milk pumping     Pumping and storing breast milk allows you to make sure that your baby is exclusively fed your breast milk, even at times when you are unable to breastfeed. This is especially important if you go back to work while you are still breastfeeding, or if you are not able to be present during feedings. Your lactation consultant can help you find a method of pumping that works best for you and give you guidelines about how long it is safe to store breast milk. Caring for your breasts while you breastfeed Nipples can become dry, cracked, and sore while breastfeeding. The following recommendations can help keep your breasts moisturized and healthy:  Avoid using soap on your nipples.  Wear a supportive bra designed especially for nursing. Avoid wearing underwire-style bras or extremely tight bras (sports bras).  Air-dry your nipples for 3-4 minutes after each feeding.  Use only cotton bra pads to absorb leaked breast milk. Leaking of breast milk between feedings is normal.  Use lanolin on your nipples after breastfeeding. Lanolin helps to maintain your skin's normal moisture barrier. Pure lanolin is not harmful (not toxic) to your baby. You may also hand express a few drops of breast milk and gently massage that milk into your nipples and allow the milk to air-dry. In the first few weeks after giving birth, some women experience breast engorgement. Engorgement can make your breasts feel heavy, warm, and tender to the touch. Engorgement peaks within 3-5 days after you give birth. The following recommendations can help to ease engorgement:  Completely empty your breasts while breastfeeding or pumping. You may  want to start by applying warm, moist heat (in the shower or with warm, water-soaked hand towels) just before feeding or pumping. This increases circulation and helps the milk flow. If your baby does not completely empty your breasts while breastfeeding, pump any extra milk after he or she is finished.  Apply ice packs to your breasts immediately after breastfeeding or pumping, unless this is too uncomfortable for you. To do this: ? Put ice in a plastic bag. ? Place a towel between your skin and the bag. ? Leave the ice on for 20 minutes, 2-3 times a day.  Make sure that your baby is latched on and positioned properly while  If engorgement persists after 48 hours of following these recommendations, contact your health care provider or a Science writer. Overall health care recommendations while breastfeeding  Eat 3 healthy meals and 3 snacks every day. Well-nourished mothers who are breastfeeding need an additional 450-500 calories a day. You can meet this requirement by increasing the amount of a balanced diet that you eat.  Drink enough water to keep your urine pale yellow or clear.  Rest often, relax, and continue to take your prenatal vitamins to prevent fatigue, stress, and low vitamin and mineral levels in your body (nutrient deficiencies).  Do not use any products that contain nicotine or tobacco, such as cigarettes and e-cigarettes. Your baby may be harmed by chemicals from cigarettes that pass into breast milk and exposure to secondhand smoke. If you need help quitting, ask your health care provider.  Avoid alcohol.  Do not use illegal drugs or marijuana.  Talk with your health care provider before taking any medicines. These include over-the-counter and prescription medicines as well as vitamins and herbal supplements. Some medicines that may be harmful to your baby can pass through breast milk.  It is possible to become pregnant while breastfeeding. If birth  control is desired, ask your health care provider about options that will be safe while breastfeeding your baby. Where to find more information: [employer]: www.llli.org Contact a health care provider if:  You feel like you want to stop breastfeeding or have become frustrated with breastfeeding.  Your nipples are cracked or bleeding.  Your breasts are red, tender, or warm.  You have: ? Painful breasts or nipples. ? A swollen area on either breast. ? A fever or chills. ? Nausea or vomiting. ? Drainage other than breast milk from your nipples.  Your breasts do not become full before feedings by the fifth day after you give birth.  You feel sad and depressed.  Your baby is: ? Too sleepy to eat well. ? Having trouble sleeping. ? More than 36 week old and wetting fewer than 6 diapers in a 24-hour period. ? Not gaining weight by 78 days of age.  Your baby has fewer than 3 stools in a 24-hour period.  Your baby's skin or the white parts of his or her eyes become yellow. Get help right away if:  Your baby is overly tired (lethargic) and does not want to wake up and feed.  Your baby develops an unexplained fever. Summary  Breastfeeding offers many health benefits for infant and mothers.  Try to breastfeed your infant when he or she shows early signs of hunger.  Gently tickle or stroke your baby's lips with your finger or nipple to allow the baby to open his or her mouth. Bring the baby to your breast. Make sure that much of the areola is in your baby's mouth. Offer one side and burp the baby before you offer the other side.  Talk with your health care provider or lactation consultant if you have questions or you face problems as you breastfeed. This information is not intended to replace advice given to you by your health care provider. Make sure you discuss any questions you have with your health care provider. Document Released: 01/03/2005 Document Revised:  03/30/2017 Document Reviewed: 02/05/2016 Elsevier Patient Education  [place]. WHAT OB PATIENTS CAN EXPECT   Confirmation of pregnancy and ultrasound ordered if medically indicated-[redacted] weeks gestation  New OB (NOB) intake with nurse and New OB (NOB) labs- [redacted] weeks  gestation  New OB (NOB) physical examination with provider- 11/[redacted] weeks gestation  Flu vaccine-[redacted] weeks gestation  Anatomy scan-[redacted] weeks gestation  Glucose tolerance test, blood work to test for anemia, T-dap vaccine-[redacted] weeks gestation  Vaginal swabs/cultures-STD/Group B strep-[redacted] weeks gestation  Appointments every 4 weeks until 28 weeks  Every 2 weeks from 28 weeks until 36 weeks  Weekly visits from 36 weeks until delivery  Common Medications Safe in Pregnancy  Acne:      Constipation:  Benzoyl Peroxide     Colace  Clindamycin      Dulcolax Suppository  Topica Erythromycin     Fibercon  Salicylic Acid      Metamucil         Miralax AVOID:        Senakot   Accutane    Cough:  Retin-A       Cough Drops  Tetracycline      Phenergan w/ Codeine if Rx  Minocycline      Robitussin (Plain & DM)  Antibiotics:     Crabs/Lice:  Ceclor       RID  Cephalosporins    AVOID:  E-Mycins      Kwell  Keflex  Macrobid/Macrodantin   Diarrhea:  Penicillin      Kao-Pectate  Zithromax      Imodium AD         PUSH FLUIDS AVOID:       Cipro     Fever:  Tetracycline      Tylenol (Regular or Extra  Minocycline       Strength)  Levaquin      Extra Strength-Do not          Exceed 8 tabs/24 hrs Caffeine:        '200mg'$ /day (equiv. To 1 cup of coffee or  approx. 3 12 oz sodas)         Gas: Cold/Hayfever:       Gas-X  Benadryl      Mylicon  Claritin       Phazyme  **Claritin-D        Chlor-Trimeton    Headaches:  Dimetapp      ASA-Free Excedrin  Drixoral-Non-Drowsy     Cold Compress  Mucinex (Guaifenasin)     Tylenol (Regular or Extra  Sudafed/Sudafed-12 Hour     Strength)  **Sudafed PE Pseudoephedrine   Tylenol Cold  & Sinus     Vicks Vapor Rub  Zyrtec  **AVOID if Problems With Blood Pressure         Heartburn: Avoid lying down for at least 1 hour after meals  Aciphex      Maalox     Rash:  Milk of Magnesia     Benadryl    Mylanta       1% Hydrocortisone Cream  Pepcid  Pepcid Complete   Sleep Aids:  Prevacid      Ambien   Prilosec       Benadryl  Rolaids       Chamomile Tea  Tums (Limit 4/day)     Unisom  Zantac       Tylenol PM         Warm milk-add vanilla or  Hemorrhoids:       Sugar for taste  Anusol/Anusol H.C.  (RX: Analapram 2.5%)  Sugar Substitutes:  Hydrocortisone OTC     Ok in moderation  Preparation H      Tucks        Vaseline lotion applied  to tissue with wiping    Herpes:     Throat:  Acyclovir      Oragel  Famvir  Valtrex     Vaccines:         Flu Shot Leg Cramps:       *Gardasil  Benadryl      Hepatitis A         Hepatitis B Nasal Spray:       Pneumovax  Saline Nasal Spray     Polio Booster         Tetanus Nausea:       Tuberculosis test or PPD  Vitamin B6 25 mg TID   AVOID:    Dramamine      *Gardasil  Emetrol       Live Poliovirus  Ginger Root 250 mg QID    MMR (measles, mumps &  High Complex Carbs @ Bedtime    rebella)  Sea Bands-Accupressure    Varicella (Chickenpox)  Unisom 1/2 tab TID     *No known complications           If received before Pain:         Known pregnancy;   Darvocet       Resume series after  Lortab        Delivery  Percocet    Yeast:   Tramadol      Femstat  Tylenol 3      Gyne-lotrimin  Ultram       Monistat  Vicodin           MISC:         All Sunscreens           Hair Coloring/highlights          Insect Repellant's          (Including DEET)         Mystic Tans  Third Trimester of Pregnancy  The third trimester is from week 28 through week 40 (months 7 through 9). This trimester is when your unborn baby (fetus) is growing very fast. At the end of the ninth month, the unborn baby is about 20 inches in length. It weighs  about 6-10 pounds. Follow these instructions at home: Medicines  Take over-the-counter and prescription medicines only as told by your doctor. Some medicines are safe and some medicines are not safe during pregnancy.  Take a prenatal vitamin that contains at least 600 micrograms (mcg) of folic acid.  If you have trouble pooping (constipation), take medicine that will make your stool soft (stool softener) if your doctor approves. Eating and drinking   Eat regular, healthy meals.  Avoid raw meat and uncooked cheese.  If you get low calcium from the food you eat, talk to your doctor about taking a daily calcium supplement.  Eat four or five small meals rather than three large meals a day.  Avoid foods that are high in fat and sugars, such as fried and sweet foods.  To prevent constipation: ? Eat foods that are high in fiber, like fresh fruits and vegetables, whole grains, and beans. ? Drink enough fluids to keep your pee (urine) clear or pale yellow. Activity  Exercise only as told by your doctor. Stop exercising if you start to have cramps.  Avoid heavy lifting, wear low heels, and sit up straight.  Do not exercise if it is too hot, too humid, or if you are in a place of great height (high altitude).  You may  continue to have sex unless your doctor tells you not to. Relieving pain and discomfort  Wear a good support bra if your breasts are tender.  Take frequent breaks and rest with your legs raised if you have leg cramps or low back pain.  Take warm water baths (sitz baths) to soothe pain or discomfort caused by hemorrhoids. Use hemorrhoid cream if your doctor approves.  If you develop puffy, bulging veins (varicose veins) in your legs: ? Wear support hose or compression stockings as told by your doctor. ? Raise (elevate) your feet for 15 minutes, 3-4 times a day. ? Limit salt in your food. Safety  Wear your seat belt when driving.  Make a list of emergency phone  numbers, including numbers for family, friends, the hospital, and police and fire departments. Preparing for your baby's arrival To prepare for the arrival of your baby:  Take prenatal classes.  Practice driving to the hospital.  Visit the hospital and tour the maternity area.  Talk to your work about taking leave once the baby comes.  Pack your hospital bag.  Prepare the baby's room.  Go to your doctor visits.  Buy a rear-facing car seat. Learn how to install it in your car. General instructions  Do not use hot tubs, steam rooms, or saunas.  Do not use any products that contain nicotine or tobacco, such as cigarettes and e-cigarettes. If you need help quitting, ask your doctor.  Do not drink alcohol.  Do not douche or use tampons or scented sanitary pads.  Do not cross your legs for long periods of time.  Do not travel for long distances unless you must. Only do so if your doctor says it is okay.  Visit your dentist if you have not gone during your pregnancy. Use a soft toothbrush to brush your teeth. Be gentle when you floss.  Avoid cat litter boxes and soil used by cats. These carry germs that can cause birth defects in the baby and can cause a loss of your baby (miscarriage) or stillbirth.  Keep all your prenatal visits as told by your doctor. This is important. Contact a doctor if:  You are not sure if you are in labor or if your water has broken.  You are dizzy.  You have mild cramps or pressure in your lower belly.  You have a nagging pain in your belly area.  You continue to feel sick to your stomach, you throw up, or you have watery poop.  You have bad smelling fluid coming from your vagina.  You have pain when you pee. Get help right away if:  You have a fever.  You are leaking fluid from your vagina.  You are spotting or bleeding from your vagina.  You have severe belly cramps or pain.  You lose or gain weight quickly.  You have trouble  catching your breath and have chest pain.  You notice sudden or extreme puffiness (swelling) of your face, hands, ankles, feet, or legs.  You have not felt the baby move in over an hour.  You have severe headaches that do not go away with medicine.  You have trouble seeing.  You are leaking, or you are having a gush of fluid, from your vagina before you are 37 weeks.  You have regular belly spasms (contractions) before you are 37 weeks. Summary  The third trimester is from week 28 through week 40 (months 7 through 9). This time is when your unborn baby is  growing very fast.  Follow your doctor's advice about medicine, food, and activity.  Get ready for the arrival of your baby by taking prenatal classes, getting all the baby items ready, preparing the baby's room, and visiting your doctor to be checked.  Get help right away if you are bleeding from your vagina, or you have chest pain and trouble catching your breath, or if you have not felt your baby move in over an hour. This information is not intended to replace advice given to you by your health care provider. Make sure you discuss any questions you have with your health care provider. Document Released: 03/30/2009 Document Revised: 04/26/2018 Document Reviewed: 02/09/2016 Elsevier Patient Education  2020 [employer].

## 2018-08-14 NOTE — Progress Notes (Signed)
ROB-Reports clear vaginal discharge x two (2) weeks. Swab collected, see orders. Taking oral potassium replacement along with iron supplements. Repeat labs next visit, see orders. PNV samples given. Plans NCB and breastfeeding. Cardiology consult completed: 08/06/18. Anticipatory guidance regarding course of prenatal care. Reviewed red flag symptoms and when to call. RTC x 2 weeks for ROB or sooner if needed.

## 2018-08-16 LAB — CERVICOVAGINAL ANCILLARY ONLY
Bacterial vaginitis: POSITIVE — AB
Candida vaginitis: NEGATIVE
Trichomonas: NEGATIVE

## 2018-08-20 ENCOUNTER — Other Ambulatory Visit: Payer: Self-pay

## 2018-08-20 MED ORDER — METRONIDAZOLE 500 MG PO TABS: 500.0000 mg | ORAL_TABLET | Freq: Two times a day (BID) | ORAL | 0 refills | Status: DC

## 2018-08-20 NOTE — Progress Notes (Signed)
Please contact patient. Vaginal swab positive for bacterial vaginosis. May Flagyl or Metrogel as treatment. Thanks, JML

## 2018-08-28 ENCOUNTER — Other Ambulatory Visit: Payer: Self-pay

## 2018-08-28 DIAGNOSIS — Z3493 Encounter for supervision of normal pregnancy, unspecified, third trimester: Secondary | ICD-10-CM

## 2018-08-28 DIAGNOSIS — E876 Hypokalemia: Secondary | ICD-10-CM

## 2018-08-29 ENCOUNTER — Other Ambulatory Visit: Payer: Self-pay

## 2018-08-29 ENCOUNTER — Ambulatory Visit (INDEPENDENT_AMBULATORY_CARE_PROVIDER_SITE_OTHER): Payer: Medicaid Other | Admitting: Certified Nurse Midwife

## 2018-08-29 VITALS — BP 103/56 | HR 82 | Wt 158.6 lb

## 2018-08-29 DIAGNOSIS — Z3493 Encounter for supervision of normal pregnancy, unspecified, third trimester: Secondary | ICD-10-CM

## 2018-08-29 LAB — POCT URINALYSIS DIPSTICK OB
Bilirubin, UA: NEGATIVE
Blood, UA: NEGATIVE
Glucose, UA: NEGATIVE
Ketones, UA: NEGATIVE
Leukocytes, UA: NEGATIVE
Nitrite, UA: NEGATIVE
POC,PROTEIN,UA: NEGATIVE
Spec Grav, UA: 1.015 (ref 1.010–1.025)
Urobilinogen, UA: 0.2 E.U./dL
pH, UA: 6 (ref 5.0–8.0)

## 2018-08-29 NOTE — Patient Instructions (Signed)
How a Baby Grows During Pregnancy  Pregnancy begins when a female's sperm enters a female's egg (fertilization). Fertilization usually happens in one of the tubes (fallopian tubes) that connect the ovaries to the womb (uterus). The fertilized egg moves down the fallopian tube to the uterus. Once it reaches the uterus, it implants into the lining of the uterus and begins to grow. For the first 10 weeks, the fertilized egg is called an embryo. After 10 weeks, it is called a fetus. As the fetus continues to grow, it receives oxygen and nutrients through tissue (placenta) that grows to support the developing baby. The placenta is the life support system for the baby. It provides oxygen and nutrition and removes waste. Learning as much as you can about your pregnancy and how your baby is developing can help you enjoy the experience. It can also make you aware of when there might be a problem and when to ask questions. How long does a typical pregnancy last? A pregnancy usually lasts 280 days, or about 40 weeks. Pregnancy is divided into three periods of growth, also called trimesters:  First trimester: 0-12 weeks.  Second trimester: 13-27 weeks.  Third trimester: 28-40 weeks. The day when your baby is ready to be born (full term) is your estimated date of delivery. How does my baby develop month by month? First month  The fertilized egg attaches to the inside of the uterus.  Some cells will form the placenta. Others will form the fetus.  The arms, legs, brain, spinal cord, lungs, and heart begin to develop.  At the end of the first month, the heart begins to beat. Second month  The bones, inner ear, eyelids, hands, and feet form.  The genitals develop.  By the end of 8 weeks, all major organs are developing. Third month  All of the internal organs are forming.  Teeth develop below the gums.  Bones and muscles begin to grow. The spine can flex.  The skin is transparent.  Fingernails  and toenails begin to form.  Arms and legs continue to grow longer, and hands and feet develop.  The fetus is about 3 inches (7.6 cm) long. Fourth month  The placenta is completely formed.  The external sex organs, neck, outer ear, eyebrows, eyelids, and fingernails are formed.  The fetus can hear, swallow, and move its arms and legs.  The kidneys begin to produce urine.  The skin is covered with a white, waxy coating (vernix) and very fine hair (lanugo). Fifth month  The fetus moves around more and can be felt for the first time (quickening).  The fetus starts to sleep and wake up and may begin to suck its finger.  The nails grow to the end of the fingers.  The organ in the digestive system that makes bile (gallbladder) functions and helps to digest nutrients.  If your baby is a girl, eggs are present in her ovaries. If your baby is a boy, testicles start to move down into his scrotum. Sixth month  The lungs are formed.  The eyes open. The brain continues to develop.  Your baby has fingerprints and toe prints. Your baby's hair grows thicker.  At the end of the second trimester, the fetus is about 9 inches (22.9 cm) long. Seventh month  The fetus kicks and stretches.  The eyes are developed enough to sense changes in light.  The hands can make a grasping motion.  The fetus responds to sound. Eighth month  All   organs and body systems are fully developed and functioning.  Bones harden, and taste buds develop. The fetus may hiccup.  Certain areas of the brain are still developing. The skull remains soft. Ninth month  The fetus gains about  lb (0.23 kg) each week.  The lungs are fully developed.  Patterns of sleep develop.  The fetus's head typically moves into a head-down position (vertex) in the uterus to prepare for birth.  The fetus weighs 6-9 lb (2.72-4.08 kg) and is 19-20 inches (48.26-50.8 cm) long. What can I do to have a healthy pregnancy and help  my baby develop? General instructions  Take prenatal vitamins as directed by your health care provider. These include vitamins such as folic acid, iron, calcium, and vitamin D. They are important for healthy development.  Take medicines only as directed by your health care provider. Read labels and ask a pharmacist or your health care provider whether over-the-counter medicines, supplements, and prescription drugs are safe to take during pregnancy.  Keep all follow-up visits as directed by your health care provider. This is important. Follow-up visits include prenatal care and screening tests. How do I know if my baby is developing well? At each prenatal visit, your health care provider will do several different tests to check on your health and keep track of your baby's development. These include:  Fundal height and position. ? Your health care provider will measure your growing belly from your pubic bone to the top of the uterus using a tape measure. ? Your health care provider will also feel your belly to determine your baby's position.  Heartbeat. ? An ultrasound in the first trimester can confirm pregnancy and show a heartbeat, depending on how far along you are. ? Your health care provider will check your baby's heart rate at every prenatal visit.  Second trimester ultrasound. ? This ultrasound checks your baby's development. It also may show your baby's gender. What should I do if I have concerns about my baby's development? Always talk with your health care provider about any concerns that you may have about your pregnancy and your baby. Summary  A pregnancy usually lasts 280 days, or about 40 weeks. Pregnancy is divided into three periods of growth, also called trimesters.  Your health care provider will monitor your baby's growth and development throughout your pregnancy.  Follow your health care provider's recommendations about taking prenatal vitamins and medicines during  your pregnancy.  Talk with your health care provider if you have any concerns about your pregnancy or your developing baby. This information is not intended to replace advice given to you by your health care provider. Make sure you discuss any questions you have with your health care provider. Document Released: 06/22/2007 Document Revised: 04/26/2018 Document Reviewed: 11/16/2016 Elsevier Patient Education  2020 Elsevier Inc.  

## 2018-08-29 NOTE — Progress Notes (Signed)
ROB doing well. Feels good movement. Complains of back pain. States that medicaid will not pay for pregnancy belt. Order placed for chiropractor referral. CMP today for check potassium level will follow up with results. pretaerm labor precautions reviewed. Follow up 2 wks with MD for Tolac counseling.   Philip Aspen, CNM

## 2018-08-29 NOTE — Addendum Note (Signed)
Addended by: Raliegh Ip on: 08/29/2018 11:52 AM   Modules accepted: Orders

## 2018-08-30 LAB — COMPREHENSIVE METABOLIC PANEL
ALT: 11 IU/L (ref 0–32)
AST: 10 IU/L (ref 0–40)
Albumin/Globulin Ratio: 1.8 (ref 1.2–2.2)
Albumin: 3.4 g/dL — ABNORMAL LOW (ref 3.9–5.0)
Alkaline Phosphatase: 153 IU/L — ABNORMAL HIGH (ref 39–117)
BUN/Creatinine Ratio: 11 (ref 9–23)
BUN: 4 mg/dL — ABNORMAL LOW (ref 6–20)
Bilirubin Total: <0.2 mg/dL (ref 0.0–1.2)
CO2: 19 mmol/L — ABNORMAL LOW (ref 20–29)
Calcium: 8.6 mg/dL — ABNORMAL LOW (ref 8.7–10.2)
Chloride: 106 mmol/L (ref 96–106)
Creatinine, Ser: 0.37 mg/dL — ABNORMAL LOW (ref 0.57–1.00)
GFR calc Af Amer: 168 mL/min/{1.73_m2} (ref >59)
GFR calc non Af Amer: 146 mL/min/{1.73_m2} (ref >59)
Globulin, Total: 1.9 g/dL (ref 1.5–4.5)
Glucose: 113 mg/dL — ABNORMAL HIGH (ref 65–99)
Potassium: 3.4 mmol/L — ABNORMAL LOW (ref 3.5–5.2)
Sodium: 138 mmol/L (ref 134–144)
Total Protein: 5.3 g/dL — ABNORMAL LOW (ref 6.0–8.5)

## 2018-08-31 ENCOUNTER — Telehealth: Payer: Self-pay

## 2018-08-31 ENCOUNTER — Other Ambulatory Visit: Payer: Self-pay | Admitting: Certified Nurse Midwife

## 2018-08-31 MED ORDER — POTASSIUM CHLORIDE ER 10 MEQ PO TBCR: 10.0000 meq | EXTENDED_RELEASE_TABLET | Freq: Four times a day (QID) | ORAL | 1 refills | Status: DC

## 2018-08-31 NOTE — Telephone Encounter (Signed)
Message left for pt to return call- script at pharmacy awaiting pickup.

## 2018-08-31 NOTE — Progress Notes (Signed)
Potassium increased to four times a day. Pt notified via my chart.   Philip Aspen, CNM

## 2018-08-31 NOTE — Telephone Encounter (Signed)
-----   Message from Philip Aspen, [place] sent at 08/31/2018  9:45 AM EDT ----- [name] ,   Will you let Tammy Snyder know that her potassium is still a little low so I am increasing her dose. I want her to take 4 times a day. I sent the order in to her pharmacy.   Thanks,  Deneise Lever

## 2018-09-11 ENCOUNTER — Observation Stay
Admission: EM | Admit: 2018-09-11 | Discharge: 2018-09-11 | Disposition: A | Discharge status: Home / Self Care | Payer: Medicaid Other | Attending: Certified Nurse Midwife | Admitting: Certified Nurse Midwife

## 2018-09-11 ENCOUNTER — Other Ambulatory Visit: Payer: Self-pay

## 2018-09-11 DIAGNOSIS — R102 Pelvic and perineal pain: Secondary | ICD-10-CM | POA: Insufficient documentation

## 2018-09-11 DIAGNOSIS — Z91040 Latex allergy status: Secondary | ICD-10-CM | POA: Diagnosis not present

## 2018-09-11 DIAGNOSIS — Z3A34 34 weeks gestation of pregnancy: Secondary | ICD-10-CM | POA: Diagnosis not present

## 2018-09-11 DIAGNOSIS — E876 Hypokalemia: Secondary | ICD-10-CM | POA: Insufficient documentation

## 2018-09-11 DIAGNOSIS — Z8639 Personal history of other endocrine, nutritional and metabolic disease: Secondary | ICD-10-CM

## 2018-09-11 DIAGNOSIS — O99283 Endocrine, nutritional and metabolic diseases complicating pregnancy, third trimester: Secondary | ICD-10-CM | POA: Diagnosis not present

## 2018-09-11 DIAGNOSIS — Z886 Allergy status to analgesic agent status: Secondary | ICD-10-CM | POA: Insufficient documentation

## 2018-09-11 DIAGNOSIS — O26893 Other specified pregnancy related conditions, third trimester: Secondary | ICD-10-CM | POA: Diagnosis not present

## 2018-09-11 DIAGNOSIS — O34219 Maternal care for unspecified type scar from previous cesarean delivery: Secondary | ICD-10-CM

## 2018-09-11 LAB — URINE DRUG SCREEN, QUALITATIVE (ARMC ONLY)
Amphetamines, Ur Screen: NOT DETECTED
Barbiturates, Ur Screen: NOT DETECTED
Benzodiazepine, Ur Scrn: NOT DETECTED
Cannabinoid 50 Ng, Ur ~~LOC~~: NOT DETECTED
Cocaine Metabolite,Ur ~~LOC~~: NOT DETECTED
MDMA (Ecstasy)Ur Screen: NOT DETECTED
Methadone Scn, Ur: NOT DETECTED
Opiate, Ur Screen: NOT DETECTED
Phencyclidine (PCP) Ur S: NOT DETECTED
Tricyclic, Ur Screen: NOT DETECTED

## 2018-09-11 LAB — URINALYSIS, COMPLETE (UACMP) WITH MICROSCOPIC
Bacteria, UA: NONE SEEN
Bilirubin Urine: NEGATIVE
Glucose, UA: NEGATIVE mg/dL
Hgb urine dipstick: NEGATIVE
Ketones, ur: NEGATIVE mg/dL
Leukocytes,Ua: NEGATIVE
Nitrite: NEGATIVE
Protein, ur: NEGATIVE mg/dL
Specific Gravity, Urine: 1.012 (ref 1.005–1.030)
pH: 8 (ref 5.0–8.0)

## 2018-09-11 LAB — WET PREP, GENITAL
Sperm: NONE SEEN
Trich, Wet Prep: NONE SEEN
Yeast Wet Prep HPF POC: NONE SEEN

## 2018-09-11 MED ORDER — METRONIDAZOLE 500 MG PO TABS: 500.0000 mg | ORAL_TABLET | Freq: Two times a day (BID) | ORAL | 0 refills | Status: AC

## 2018-09-11 MED ORDER — HYDROXYZINE HCL 25 MG PO TABS
25.0000 mg | ORAL_TABLET | Freq: Once | ORAL | Status: AC
  Administered 2018-09-11: 23:00:00 25 mg via ORAL
  Filled 2018-09-11: qty 1

## 2018-09-11 MED ORDER — METRONIDAZOLE 500 MG PO TABS
500.0000 mg | ORAL_TABLET | Freq: Two times a day (BID) | ORAL | Status: DC
  Administered 2018-09-11: 500 mg via ORAL
  Filled 2018-09-11: qty 1

## 2018-09-11 NOTE — OB Triage Note (Signed)
Pt is G3P2 seen at 34wk2d w/ c/o of abdominal pain and sharp vaginal pressure. Pt states her stomach has been "tightening since this morning." Pt rates her pain a 5/10. Pt also complains of decreased fetal movement. FHT 141. Pt denies vaginal bleeding and leaking of fluids. Pt still complains of decreased fetal movement. Monitors applied and assessing. Will continue to monitor.

## 2018-09-11 NOTE — Progress Notes (Addendum)
Pt given discharge instructions and medications discussed with the patient per the provider order. All questions answered and pt verbalized understanding. Pt discharged home and friend states she was driving.

## 2018-09-12 ENCOUNTER — Ambulatory Visit (INDEPENDENT_AMBULATORY_CARE_PROVIDER_SITE_OTHER): Payer: Medicaid Other | Admitting: Obstetrics and Gynecology

## 2018-09-12 ENCOUNTER — Encounter: Payer: Self-pay | Admitting: Obstetrics and Gynecology

## 2018-09-12 VITALS — BP 103/64 | HR 84 | Wt 160.2 lb

## 2018-09-12 DIAGNOSIS — E876 Hypokalemia: Secondary | ICD-10-CM

## 2018-09-12 DIAGNOSIS — B9689 Other specified bacterial agents as the cause of diseases classified elsewhere: Secondary | ICD-10-CM

## 2018-09-12 DIAGNOSIS — O34219 Maternal care for unspecified type scar from previous cesarean delivery: Secondary | ICD-10-CM

## 2018-09-12 DIAGNOSIS — Z3483 Encounter for supervision of other normal pregnancy, third trimester: Secondary | ICD-10-CM

## 2018-09-12 DIAGNOSIS — Z98891 History of uterine scar from previous surgery: Secondary | ICD-10-CM

## 2018-09-12 DIAGNOSIS — N760 Acute vaginitis: Secondary | ICD-10-CM

## 2018-09-12 NOTE — Progress Notes (Signed)
ROB-Pt present for routine prenatal care. Pt stated that she is doing well no problems.  

## 2018-09-12 NOTE — Progress Notes (Signed)
ROB Consult: Patient presents from midwifery care for [facility] consultation.  She has a prior h/o C-section x 1 (due to CPD, infant was 9 lb 1 oz).  She subsequently had a successful VBAC of a 7 lb 14 oz baby.  Counseled regarding TOLAC vs RCS; risks/benefits discussed in detail.  VBAC calculated score is 88.7%.  All questions answered.  Patient elects for TOLAC.  Is scheduled for growth ultrasound at 38 weeks to determine size of current fetus. Patient notes if infant is closer to 9 lbs she would not desire to [facility] at that time. Advised to continue to monitor/limit excessive weight gain.   Patient reports that she was seen in triage last night due to contractions. Was given Vistaril and diagnosed with BV infection.  Has not yet started antibiotics but plans to pick them up today.  Was told that she was 2 cm dilated. Is concerned about risk of PTL. Discussed concerns with patient, given reassurance, reiterated pelvic rest, adequate hydration, and treatment of newly diagnosed infection.   Also with h/o hypokalemia, patient currently taking 10 Meq of potassium, last level was 3.4 2 weeks ago. Has been taking for almost 1 month. Will repeat levels today.

## 2018-09-12 NOTE — Patient Instructions (Signed)
Preparing for Vaginal Birth After Cesarean Delivery Vaginal birth after cesarean delivery (VBAC) is giving birth vaginally after previously delivering a baby through a cesarean section (C-section). You and your health are provider will discuss your options and whether you may be a good candidate for VBAC. What are my options? After a cesarean delivery, your options for future deliveries may include:  Scheduled repeat cesarean delivery. This is done in a hospital with an operating room.  Trial of labor after cesarean (TOLAC). A successful TOLAC results in a vaginal delivery. If it is not successful, you will need to have a cesarean delivery. TOLAC should be attempted in facilities where an emergency cesarean delivery can be performed. It should not be done as a home birth. Talk with your health care provider about the risks and benefits of each option early in your pregnancy. The best option for you will depend on your preferences and your overall health as well as your baby's. What should I know about my past cesarean delivery? It is important to know what type of incision was made in your uterus in a past cesarean delivery. The type of incision can affect the success of your TOLAC. Types of incisions include:  Low transverse. This is a side-to-side cut low on your uterus. The scar on your skin looks like a horizontal line just above your pubic area. This type of cut is the most common and makes you a good candidate for TOLAC.  Low vertical. This is an up-and-down cut low on your uterus. The scar on your skin looks like a vertical line between your pubic area and belly button. This type of cut puts you at higher risk for problems during TOLAC.  High vertical or classical. This is an up-and-down cut high on your uterus. The scar on your skin looks like a vertical line that runs over the top of your belly button. This type of cut has the highest risk for problems and usually means that TOLAC is not an  option. When is VBAC not an option? As you progress through your pregnancy, circumstances may change and you may need to reconsider your options. Your situation may also change even as you begin TOLAC. Your health care provider may not want you to attempt a VBAC if you:  Need to have labor started (induced) because your cervix is not ready for labor.  Have never had a vaginal delivery.  Have had more than two cesarean deliveries.  Are overdue.  Are pregnant with a very large baby.  Have a condition that causes high blood pressure (preeclampsia). Questions to ask your health care provider  Am I a good candidate for TOLAC?  What are my chances of a successful vaginal delivery?  Is my preferred birth location equipped for a TOLAC?  What are my pain management options during a TOLAC? Where to find more information  American Congress of Obstetricians and Gynecologists: www.acog.org  American College of Nurse-Midwives: www.midwife.org Summary  Vaginal birth after cesarean delivery (VBAC) is giving birth vaginally after previously delivering a baby through a cesarean section (C-section).  VBAC may be a safe and appropriate option for you depending on your medical history and other risk factors. Talk with your health care provider about the options available to you, and the risks and benefits of each early in your pregnancy.  TOLAC should be attempted in facilities where emergency cesarean section procedures can be performed. This information is not intended to replace advice given to you by   your health care provider. Make sure you discuss any questions you have with your health care provider. Document Released: 09/21/2010 Document Revised: 05/01/2018 Document Reviewed: 04/14/2016 Elsevier Patient Education  2020 Elsevier Inc.  

## 2018-09-13 DIAGNOSIS — Z8639 Personal history of other endocrine, nutritional and metabolic disease: Secondary | ICD-10-CM

## 2018-09-13 LAB — POTASSIUM: Potassium: 3.6 mmol/L (ref 3.5–5.2)

## 2018-09-13 NOTE — Discharge Summary (Signed)
Obstetric Discharge Summary  Patient ID: Tammy Snyder MRN: [id number] DOB/AGE: 1990/03/15 28 y.o.   Date of Admission: 09/11/2018  Date of Discharge: 09/11/2018  Admitting Diagnosis: Observation at [redacted]w[redacted]d  Secondary Diagnosis: Pelvic pain in pregnancy, Hypokalemia, History cesarean section, History successful VBAC, Hearing loss    Discharge Diagnosis: No other diagnosis   Antepartum Procedures: NST, Vistaril, Flagyl, and Oral hydration with water    Brief Hospital Course   L&D OB Triage Note  Tammy Snyder is a 28 y.o. G75P2002 female at [redacted]w[redacted]d, EDD Estimated Date of Delivery: 10/21/18 who presented to triage for complaints of intermittent abdominal pain and sharp vaginal pressure, scale: 5/10.  She was evaluated by the nurses with no significant findings for preterm labor or fetal distress. Vital signs stable. An NST was performed and has been reviewed by CNM. She was treated with Flagyl, Vistaril and water.   NST INTERPRETATION: Indications: rule out uterine contractions  Mode: External Baseline Rate (A): 135 bpm Variability: Moderate Accelerations: 15 x 15 Decelerations: None Contraction Frequency (min): UI  Impression: reactive  Dilation: 2.5 Effacement (%): Thick Station: Ballotable Exam by:: [employer] RN(per [employer] CNM verbal order to check cervix)   Recent Results (from the past 2160 hour(s))  Urinalysis, Complete w Microscopic     Status: Abnormal   Collection Time: 09/11/18  9:24 PM  Result Value Ref Range   Color, Urine STRAW (A) YELLOW   APPearance HAZY (A) CLEAR   Specific Gravity, Urine 1.012 1.005 - 1.030   pH 8.0 5.0 - 8.0   Glucose, UA NEGATIVE NEGATIVE mg/dL   Hgb urine dipstick NEGATIVE NEGATIVE   Bilirubin Urine NEGATIVE NEGATIVE   Ketones, ur NEGATIVE NEGATIVE mg/dL   Protein, ur NEGATIVE NEGATIVE mg/dL   Nitrite NEGATIVE NEGATIVE   Leukocytes,Ua NEGATIVE NEGATIVE   RBC / HPF 0-5 0 - 5 RBC/hpf   WBC, UA 0-5 0 - 5 WBC/hpf   Bacteria, UA NONE SEEN  NONE SEEN   Squamous Epithelial / LPF 0-5 0 - 5   Mucus PRESENT    Amorphous Crystal PRESENT     Comment: Performed at [facility], [address] [place], [place] [address]  Wet prep, genital     Status: Abnormal   Collection Time: 09/11/18  9:24 PM  Result Value Ref Range   Yeast Wet Prep HPF POC NONE SEEN NONE SEEN   Trich, Wet Prep NONE SEEN NONE SEEN   Clue Cells Wet Prep HPF POC PRESENT (A) NONE SEEN   WBC, Wet Prep HPF POC FEW (A) NONE SEEN   Sperm NONE SEEN     Comment: Performed at [facility], [address] [place], [place] [address]  Urine Drug Screen, Qualitative ([facility] only)     Status: None   Collection Time: 09/11/18  9:40 PM  Result Value Ref Range   Tricyclic, Ur Screen NONE DETECTED NONE DETECTED   Amphetamines, Ur Screen NONE DETECTED NONE DETECTED   MDMA (Ecstasy)Ur Screen NONE DETECTED NONE DETECTED   Cocaine Metabolite,Ur [place] NONE DETECTED NONE DETECTED   Opiate, Ur Screen NONE DETECTED NONE DETECTED   Phencyclidine (PCP) Ur S NONE DETECTED NONE DETECTED   Cannabinoid 50 Ng, Ur [name] NONE DETECTED NONE DETECTED   Barbiturates, Ur Screen NONE DETECTED NONE DETECTED   Benzodiazepine, Ur Scrn NONE DETECTED NONE DETECTED   Methadone Scn, Ur NONE DETECTED NONE DETECTED    Comment: (NOTE) Tricyclics + metabolites, urine    Cutoff 1000 ng/mL Amphetamines + metabolites, urine  Cutoff  1000 ng/mL MDMA (Ecstasy), urine              Cutoff 500 ng/mL Cocaine Metabolite, urine          Cutoff 300 ng/mL Opiate + metabolites, urine        Cutoff 300 ng/mL Phencyclidine (PCP), urine         Cutoff 25 ng/mL Cannabinoid, urine                 Cutoff 50 ng/mL Barbiturates + metabolites, urine  Cutoff 200 ng/mL Benzodiazepine, urine              Cutoff 200 ng/mL Methadone, urine                   Cutoff 300 ng/mL The urine drug screen provides only a preliminary, unconfirmed analytical test result and should not be used for non-medical purposes. Clinical  consideration and professional judgment should be applied to any positive drug screen result due to possible interfering substances. A more specific alternate chemical method must be used in order to obtain a confirmed analytical result. Gas chromatography / mass spectrometry (GC/MS) is the preferred confirmat ory method. Performed at [facility], [address] [place], [place] [id number]      Plan: NST performed was reviewed and was found to be reactive. She was discharged home with bleeding/labor precautions.  Continue routine prenatal care. Follow up with MD tomorrow [facility]    Discharge Instructions: Per After Visit Summary.  Activity: Advance as tolerated. Pelvic rest for 6 weeks.  Also refer to After Visit Summary.   Diet: Regular  Medications: Allergies as of 09/11/2018      Reactions   Latex Rash   Nsaids Rash   Tylenol [acetaminophen] Itching, Rash      Medication List    TAKE these medications   Fusion Plus Caps Take 1 tablet by mouth daily.   metroNIDAZOLE 500 MG tablet Commonly known as: FLAGYL Take 1 tablet (500 mg total) by mouth every 12 (twelve) hours for 7 days.   potassium chloride 10 MEQ tablet Commonly known as: K-DUR Take 1 tablet (10 mEq total) by mouth 4 (four) times daily.   Prenatal Vitamins 28-0.8 MG Tabs Take 1 tablet by mouth daily.       Discharged Condition: stable  Discharged to: home   Gunnar BullaJenkins Michelle Virgal [place], CNM [facility], [facility] 09/13/18 1:58 PM

## 2018-09-19 ENCOUNTER — Other Ambulatory Visit: Payer: Self-pay

## 2018-09-19 ENCOUNTER — Inpatient Hospital Stay
Admission: EM | Admit: 2018-09-19 | Discharge: 2018-09-20 | Disposition: A | Discharge status: Home / Self Care | Payer: Medicaid Other | Attending: Obstetrics and Gynecology | Admitting: Obstetrics and Gynecology

## 2018-09-19 ENCOUNTER — Telehealth: Payer: Self-pay | Admitting: Obstetrics and Gynecology

## 2018-09-19 ENCOUNTER — Inpatient Hospital Stay: Payer: Medicaid Other

## 2018-09-19 DIAGNOSIS — R102 Pelvic and perineal pain: Secondary | ICD-10-CM | POA: Diagnosis not present

## 2018-09-19 DIAGNOSIS — Z3A35 35 weeks gestation of pregnancy: Secondary | ICD-10-CM | POA: Insufficient documentation

## 2018-09-19 DIAGNOSIS — Z79899 Other long term (current) drug therapy: Secondary | ICD-10-CM | POA: Insufficient documentation

## 2018-09-19 DIAGNOSIS — O26853 Spotting complicating pregnancy, third trimester: Secondary | ICD-10-CM | POA: Diagnosis not present

## 2018-09-19 DIAGNOSIS — Z87891 Personal history of nicotine dependence: Secondary | ICD-10-CM | POA: Diagnosis not present

## 2018-09-19 DIAGNOSIS — H9190 Unspecified hearing loss, unspecified ear: Secondary | ICD-10-CM | POA: Diagnosis not present

## 2018-09-19 DIAGNOSIS — O26893 Other specified pregnancy related conditions, third trimester: Secondary | ICD-10-CM | POA: Diagnosis not present

## 2018-09-19 DIAGNOSIS — O4693 Antepartum hemorrhage, unspecified, third trimester: Secondary | ICD-10-CM | POA: Diagnosis not present

## 2018-09-19 DIAGNOSIS — O34219 Maternal care for unspecified type scar from previous cesarean delivery: Secondary | ICD-10-CM

## 2018-09-19 DIAGNOSIS — Z20828 Contact with and (suspected) exposure to other viral communicable diseases: Secondary | ICD-10-CM | POA: Insufficient documentation

## 2018-09-19 DIAGNOSIS — Z8639 Personal history of other endocrine, nutritional and metabolic disease: Secondary | ICD-10-CM

## 2018-09-19 LAB — URINE DRUG SCREEN, QUALITATIVE (ARMC ONLY)
Amphetamines, Ur Screen: NOT DETECTED
Barbiturates, Ur Screen: NOT DETECTED
Benzodiazepine, Ur Scrn: NOT DETECTED
Cannabinoid 50 Ng, Ur ~~LOC~~: NOT DETECTED
Cocaine Metabolite,Ur ~~LOC~~: NOT DETECTED
MDMA (Ecstasy)Ur Screen: NOT DETECTED
Methadone Scn, Ur: NOT DETECTED
Opiate, Ur Screen: NOT DETECTED
Phencyclidine (PCP) Ur S: NOT DETECTED
Tricyclic, Ur Screen: NOT DETECTED

## 2018-09-19 LAB — COMPREHENSIVE METABOLIC PANEL
ALT: 10 U/L (ref 0–44)
AST: 16 U/L (ref 15–41)
Albumin: 2.9 g/dL — ABNORMAL LOW (ref 3.5–5.0)
Alkaline Phosphatase: 217 U/L — ABNORMAL HIGH (ref 38–126)
Anion gap: 8 (ref 5–15)
BUN: 9 mg/dL (ref 6–20)
CO2: 19 mmol/L — ABNORMAL LOW (ref 22–32)
Calcium: 8.9 mg/dL (ref 8.9–10.3)
Chloride: 110 mmol/L (ref 98–111)
Creatinine, Ser: <0.3 mg/dL — ABNORMAL LOW (ref 0.44–1.00)
Glucose, Bld: 79 mg/dL (ref 70–99)
Potassium: 3.5 mmol/L (ref 3.5–5.1)
Sodium: 137 mmol/L (ref 135–145)
Total Bilirubin: 0.3 mg/dL (ref 0.3–1.2)
Total Protein: 5.9 g/dL — ABNORMAL LOW (ref 6.5–8.1)

## 2018-09-19 LAB — TYPE AND SCREEN
ABO/RH(D): O POS
Antibody Screen: NEGATIVE

## 2018-09-19 LAB — CBC
HCT: 31.2 % — ABNORMAL LOW (ref 36.0–46.0)
Hemoglobin: 10.7 g/dL — ABNORMAL LOW (ref 12.0–15.0)
MCH: 33.6 pg (ref 26.0–34.0)
MCHC: 34.3 g/dL (ref 30.0–36.0)
MCV: 98.1 fL (ref 80.0–100.0)
Platelets: 137 10*3/uL — ABNORMAL LOW (ref 150–400)
RBC: 3.18 MIL/uL — ABNORMAL LOW (ref 3.87–5.11)
RDW: 12.9 % (ref 11.5–15.5)
WBC: 8.9 10*3/uL (ref 4.0–10.5)
nRBC: 0 % (ref 0.0–0.2)

## 2018-09-19 LAB — SARS CORONAVIRUS 2 BY RT PCR (HOSPITAL ORDER, PERFORMED IN ~~LOC~~ HOSPITAL LAB): SARS Coronavirus 2: NEGATIVE

## 2018-09-19 MED ORDER — OXYTOCIN 40 UNITS IN NORMAL SALINE INFUSION - SIMPLE MED
INTRAVENOUS | Status: AC
  Filled 2018-09-19: qty 1000

## 2018-09-19 MED ORDER — LIDOCAINE 5 % EX PTCH
MEDICATED_PATCH | CUTANEOUS | Status: AC
  Filled 2018-09-19: qty 1

## 2018-09-19 MED ORDER — ZOLPIDEM TARTRATE 5 MG PO TABS: 5.0000 mg | ORAL_TABLET | Freq: Every evening | ORAL | Status: DC | PRN

## 2018-09-19 MED ORDER — METHYLERGONOVINE MALEATE 0.2 MG/ML IJ SOLN
INTRAMUSCULAR | Status: AC
  Filled 2018-09-19: qty 1

## 2018-09-19 MED ORDER — LACTATED RINGERS IV SOLN
INTRAVENOUS | Status: DC
  Administered 2018-09-19 – 2018-09-20 (×2): via INTRAVENOUS

## 2018-09-19 MED ORDER — MISOPROSTOL 200 MCG PO TABS
ORAL_TABLET | ORAL | Status: AC
  Filled 2018-09-19: qty 5

## 2018-09-19 MED ORDER — SOD CITRATE-CITRIC ACID 500-334 MG/5ML PO SOLN
ORAL | Status: AC
  Filled 2018-09-19: qty 30

## 2018-09-19 MED ORDER — CARBOPROST TROMETHAMINE 250 MCG/ML IM SOLN
INTRAMUSCULAR | Status: AC
  Filled 2018-09-19: qty 1

## 2018-09-19 MED ORDER — TERBUTALINE SULFATE 1 MG/ML IJ SOLN
INTRAMUSCULAR | Status: AC
  Filled 2018-09-19: qty 1

## 2018-09-19 MED ORDER — BUTORPHANOL TARTRATE 1 MG/ML IJ SOLN
2.0000 mg | Freq: Once | INTRAMUSCULAR | Status: AC
  Administered 2018-09-19: 2 mg via INTRAVENOUS
  Filled 2018-09-19: qty 2

## 2018-09-19 MED ORDER — BETAMETHASONE SOD PHOS & ACET 6 (3-3) MG/ML IJ SUSP
12.0000 mg | Freq: Once | INTRAMUSCULAR | Status: AC
  Administered 2018-09-19: 12 mg via INTRAMUSCULAR

## 2018-09-19 MED ORDER — BETAMETHASONE SOD PHOS & ACET 6 (3-3) MG/ML IJ SUSP
INTRAMUSCULAR | Status: AC
  Filled 2018-09-19: qty 5

## 2018-09-19 NOTE — Progress Notes (Signed)
Tammy Snyder is a 28 y.o. G3P2002 at [redacted]w[redacted]d by LMP admitted for vaginal bleeding.  Subjective: Reports sudden onset lower pelvic pain over incision with raditing sharp pains to right side and back, rectal pressure and continued vaginal bleeding. Rates pain a 7 on pain scan 0-10.   Objective: BP 115/61   Pulse 85   Temp 97.9 F (36.6 C) (Oral)   Resp 18   Ht 5' 5.5" (1.664 m)   Wt 72.6 kg   LMP 01/11/2018 (Approximate)   BMI 26.22 kg/m  No intake/output data recorded. Total I/O In: -  Out: 150 [Blood:150] Small amount dark red blood noted on towel and in bedpan (mixed with urine in bedpan), no clots noted. Tenderness noted over incision and on RLQ with slight palpation, otherwise abdomen and uterus soft and not tender. Diaphoretic and breathing through contractions.  Fetal Wellbeing:  Category I UC:   regular, every 2-3 minutes, not palpated, but noted on EFM SVE:   Dilation: 4 Effacement (%): Thick Exam by:: Lynnex Fulp CNM no change from previous exam, large hard stool noted in rectum on vaginal exam.  Labs: Lab Results  Component Value Date   WBC 8.9 09/19/2018   HGB 10.7 (L) 09/19/2018   HCT 31.2 (L) 09/19/2018   MCV 98.1 09/19/2018   PLT 137 (L) 09/19/2018    Assessment / Plan: bleeding at 35 weeks, uterine contractions, previous c/s, desiring another TOLAC. awaiting Covid results. will give one time dose of stadol and continue observation.  Labor: not declared in active labor yet. Preeclampsia:  labs stable Pain Control:  NA Anticipated MOD:  desires TOLAC if laboring,  Dr Amalia Hailey updated and agrees with plan of care.   Tammy Snyder N Tammy Snyder 09/19/2018, 6:30 PM

## 2018-09-19 NOTE — Telephone Encounter (Signed)
The patient called hysterically crying stating that she was "pouring out blood" from her vagina. The patient stated she filled a heavy flow pad in less than 30 minutes to an hour. I spoke with nurse, Nurse advised me to inform pt to go straight to the ED. Pt voiced understanding and stated she was on her way there. Please advise.

## 2018-09-19 NOTE — Progress Notes (Signed)
Name: Tammy Snyder   MRN: [id number]    DOB: 01-07-1991   Date:09/19/2018       Progress Note  Subjective  Chief Complaint  Painless vaginal bleeding at 35 weeks of pregnancy.  Reports slow but continued trickle of bleeding since admission, denies pain or contractions.  HPI  See admission H&P  Past Medical History:  Diagnosis Date  . Bipolar 1 disorder ([place])   . Borderline personality disorder ([place])   . Cochlear implant in place   . Hearing loss   . Interstitial cystitis   . Mental disorder    Borderline personalities disorder  . Oliguria 2013   cystoscopy, hydrodilation under anesthesia  . Polysubstance abuse ([place])   . Vaginal Pap smear, abnormal     Past Surgical History:  Procedure Laterality Date  . Calibration of urethra with dilation of the urethra under anesthesia  04/04/2011   Dr. Charyl Dancer  . CESAREAN SECTION  10/29/2010   Procedure: CESAREAN SECTION;  Surgeon: Florian Buff, MD;  Location: [facility];  Service: Gynecology;;  . [place]    . COCHLEAR IMPLANT    . COCHLEAR IMPLANT    . cystoscopy, hydrodilation under anesthesia  04/04/2011    Family History  Problem Relation Age of Onset  . Hypertension Father   . Stroke Father   . Hypertension Mother   . Lung cancer Maternal Grandfather   . Syncope episode Paternal Grandmother     Social History   Socioeconomic History  . Marital status: Married    Spouse name: Not on file  . Number of children: Not on file  . Years of education: Not on file  . Highest education level: Not on file  Occupational History  . Not on file  Social Needs  . Financial resource strain: Not hard at all  . Food insecurity    Worry: Never true    Inability: Never true  . Transportation needs    Medical: No    Non-medical: No  Tobacco Use  . Smoking status: Former Smoker    Packs/day: 1.00    Years: 8.00    Pack years: 8.00    Types: Cigarettes    Quit date: 01/02/2018    Years since quitting: 0.7  .  Smokeless tobacco: Never Used  Substance and Sexual Activity  . Alcohol use: Not Currently    Alcohol/week: 0.0 standard drinks  . Drug use: No  . Sexual activity: Yes    Partners: Male    Birth control/protection: None  Lifestyle  . Physical activity    Days per week: 0 days    Minutes per session: 0 min  . Stress: Not at all  Relationships  . Social connections    Talks on phone: More than three times a week    Gets together: More than three times a week    Attends religious service: Never    Active member of club or organization: No    Attends meetings of clubs or organizations: Never    Relationship status: Not on file  . Intimate partner violence    Fear of current or ex partner: No    Emotionally abused: No    Physically abused: No    Forced sexual activity: No  Other Topics Concern  . Not on file  Social History Narrative  . Not on file     Current Facility-Administered Medications:  .  lactated ringers infusion, , Intravenous, Continuous, Phoebe Marter N, CNM, Last Rate: 125 mL/hr  at 09/19/18 1520 .  zolpidem (AMBIEN) tablet 5 mg, 5 mg, Oral, QHS PRN, Bernadetta Roell N, CNM  Allergies  Allergen Reactions  . Latex Rash  . Nsaids Rash  . Tylenol [Acetaminophen] Itching and Rash     ROS  Negative except for the vaginal bleeding and constipation (reports last BM 2 days ago)   Objective  Vitals:   09/19/18 1544 09/19/18 1601  BP: 126/78   Pulse: (!) 108   Resp:  18  Temp:  97.9 F (36.6 C)  TempSrc:  Oral  Weight:  72.6 kg  Height:  5' 5.5" (1.664 m)    Physical Exam   Resting quietly in bed upon entering the room. VSS Abdomen soft, gravid and not tender. No contractions palpated Light amount of dark red blood noted coming from the vagina, gush noted when moving in the bed.  Large amount of hard stool noted in rectum on vaginal exam earlier.    Recent Results (from the past 2160 hour(s))  POC Urinalysis Dipstick OB     Status: None    Collection Time: 07/03/18 11:18 AM  Result Value Ref Range   Color, UA Yellow    Clarity, UA Clear    Glucose, UA Negative Negative   Bilirubin, UA Negative    Ketones, UA Negative    Spec Grav, UA 1.010 1.010 - 1.025   Blood, UA Negative    pH, UA 6.0 5.0 - 8.0   POC,PROTEIN,UA Negative Negative, Trace, Small (1+), Moderate (2+), Large (3+), 4+   Urobilinogen, UA 0.2 0.2 or 1.0 E.U./dL   Nitrite, UA Negative    Leukocytes, UA Negative Negative   Appearance     Odor    POC Urinalysis Dipstick OB     Status: None   Collection Time: 07/31/18  9:45 AM  Result Value Ref Range   Color, UA yellow    Clarity, UA clear    Glucose, UA Negative Negative   Bilirubin, UA neg    Ketones, UA neg    Spec Grav, UA 1.025 1.010 - 1.025   Blood, UA neg    pH, UA 7.0 5.0 - 8.0   POC,PROTEIN,UA Negative Negative, Trace, Small (1+), Moderate (2+), Large (3+), 4+   Urobilinogen, UA 0.2 0.2 or 1.0 E.U./dL   Nitrite, UA neg    Leukocytes, UA Negative Negative   Appearance     Odor    CBC     Status: Abnormal   Collection Time: 07/31/18 10:03 AM  Result Value Ref Range   WBC 9.8 3.4 - 10.8 x10E3/uL   RBC 3.06 (L) 3.77 - 5.28 x10E6/uL   Hemoglobin 10.4 (L) 11.1 - 15.9 g/dL   Hematocrit 29.8 (L) 34.0 - 46.6 %   MCV 97 79 - 97 fL   MCH 34.0 (H) 26.6 - 33.0 pg   MCHC 34.9 31.5 - 35.7 g/dL   RDW 12.1 11.7 - 15.4 %   Platelets 168 150 - 450 x10E3/uL  Glucose, 1 hour gestational     Status: None   Collection Time: 07/31/18 10:03 AM  Result Value Ref Range   Gestational Diabetes Screen 94 65 - 139 mg/dL    Comment: According to ADA, a glucose threshold of >139 mg/dL after 50-gram load identifies approximately 80% of women with gestational diabetes mellitus, while the sensitivity is further increased to approximately 90% by a threshold of >129 mg/dL.   RPR     Status: None   Collection Time: 07/31/18 10:03 AM  Result Value Ref Range   RPR Ser Ql Non Reactive Non Reactive  Urinalysis, Complete  w Microscopic     Status: Abnormal   Collection Time: 08/01/18  8:13 PM  Result Value Ref Range   Color, Urine STRAW (A) YELLOW   APPearance CLEAR (A) CLEAR   Specific Gravity, Urine 1.006 1.005 - 1.030   pH 7.0 5.0 - 8.0   Glucose, UA NEGATIVE NEGATIVE mg/dL   Hgb urine dipstick NEGATIVE NEGATIVE   Bilirubin Urine NEGATIVE NEGATIVE   Ketones, ur 5 (A) NEGATIVE mg/dL   Protein, ur NEGATIVE NEGATIVE mg/dL   Nitrite NEGATIVE NEGATIVE   Leukocytes,Ua NEGATIVE NEGATIVE   RBC / HPF 0-5 0 - 5 RBC/hpf   WBC, UA 0-5 0 - 5 WBC/hpf   Bacteria, UA RARE (A) NONE SEEN   Squamous Epithelial / LPF 0-5 0 - 5    Comment: Performed at [facility], [address], [place], [place] [address]  Urine Drug Screen, Qualitative     Status: Abnormal   Collection Time: 08/01/18  8:13 PM  Result Value Ref Range   Tricyclic, Ur Screen NONE DETECTED NONE DETECTED   Amphetamines, Ur Screen NONE DETECTED NONE DETECTED   MDMA (Ecstasy)Ur Screen NONE DETECTED NONE DETECTED   Cocaine Metabolite,Ur [place] NONE DETECTED NONE DETECTED   Opiate, Ur Screen NONE DETECTED NONE DETECTED   Phencyclidine (PCP) Ur S NONE DETECTED NONE DETECTED   Cannabinoid 50 Ng, Ur [place] POSITIVE (A) NONE DETECTED   Barbiturates, Ur Screen NONE DETECTED NONE DETECTED   Benzodiazepine, Ur Scrn NONE DETECTED NONE DETECTED   Methadone Scn, Ur NONE DETECTED NONE DETECTED    Comment: (NOTE) Tricyclics + metabolites, urine    Cutoff 1000 ng/mL Amphetamines + metabolites, urine  Cutoff 1000 ng/mL MDMA (Ecstasy), urine              Cutoff 500 ng/mL Cocaine Metabolite, urine          Cutoff 300 ng/mL Opiate + metabolites, urine        Cutoff 300 ng/mL Phencyclidine (PCP), urine         Cutoff 25 ng/mL Cannabinoid, urine                 Cutoff 50 ng/mL Barbiturates + metabolites, urine  Cutoff 200 ng/mL Benzodiazepine, urine              Cutoff 200 ng/mL Methadone, urine                   Cutoff 300 ng/mL The urine drug screen provides  only a preliminary, unconfirmed analytical test result and should not be used for non-medical purposes. Clinical consideration and professional judgment should be applied to any positive drug screen result due to possible interfering substances. A more specific alternate chemical method must be used in order to obtain a confirmed analytical result. Gas chromatography / mass spectrometry (GC/MS) is the preferred confirmat ory method. Performed at [facility], [address] [place], [place] [address]   Comprehensive metabolic panel     Status: Abnormal   Collection Time: 08/01/18  8:17 PM  Result Value Ref Range   Sodium 134 (L) 135 - 145 mmol/L   Potassium 2.4 (LL) 3.5 - 5.1 mmol/L    Comment: CRITICAL RESULT CALLED TO, READ BACK BY AND VERIFIED WITH YESSICA HEUAS RN AT 2048 ON 08/01/2018 SNG    Chloride 106 98 - 111 mmol/L   CO2 17 (L) 22 - 32 mmol/L  Glucose, Bld 111 (H) 70 - 99 mg/dL   BUN 7 6 - 20 mg/dL   Creatinine, Ser 0.41 (L) 0.44 - 1.00 mg/dL   Calcium 8.6 (L) 8.9 - 10.3 mg/dL   Total Protein 7.1 6.5 - 8.1 g/dL   Albumin 3.5 3.5 - 5.0 g/dL   AST 22 15 - 41 U/L   ALT 13 0 - 44 U/L   Alkaline Phosphatase 121 38 - 126 U/L   Total Bilirubin 0.4 0.3 - 1.2 mg/dL   GFR calc non Af Amer >60 >60 mL/min   GFR calc Af Amer >60 >60 mL/min   Anion gap 11 5 - 15    Comment: Performed at [facility], [address] [place], [place] [address]  CBC with Differential     Status: Abnormal   Collection Time: 08/01/18  8:17 PM  Result Value Ref Range   WBC 11.6 (H) 4.0 - 10.5 K/uL   RBC 3.41 (L) 3.87 - 5.11 MIL/uL   Hemoglobin 11.6 (L) 12.0 - 15.0 g/dL   HCT 33.0 (L) 36.0 - 46.0 %   MCV 96.8 80.0 - 100.0 fL   MCH 34.0 26.0 - 34.0 pg   MCHC 35.2 30.0 - 36.0 g/dL   RDW 12.9 11.5 - 15.5 %   Platelets 183 150 - 400 K/uL   nRBC 0.0 0.0 - 0.2 %   Neutrophils Relative % 65 %   Neutro Abs 7.5 1.7 - 7.7 K/uL   Lymphocytes Relative 26 %   Lymphs Abs 3.0 0.7 - 4.0  K/uL   Monocytes Relative 7 %   Monocytes Absolute 0.9 0.1 - 1.0 K/uL   Eosinophils Relative 1 %   Eosinophils Absolute 0.1 0.0 - 0.5 K/uL   Basophils Relative 0 %   Basophils Absolute 0.0 0.0 - 0.1 K/uL   Immature Granulocytes 1 %   Abs Immature Granulocytes 0.09 (H) 0.00 - 0.07 K/uL    Comment: Performed at [facility], [place], [place] [address]  Troponin I (High Sensitivity)     Status: None   Collection Time: 08/01/18  8:17 PM  Result Value Ref Range   Troponin I (High Sensitivity) 6 <18 ng/L    Comment: (NOTE) Elevated high sensitivity troponin I (hsTnI) values and significant  changes across serial measurements may suggest ACS but many other  chronic and acute conditions are known to elevate hsTnI results.  Refer to the "Links" section for chest pain algorithms and additional  guidance. Performed at [facility], [address] [place], [place] [address]   Magnesium     Status: None   Collection Time: 08/01/18  8:17 PM  Result Value Ref Range   Magnesium 1.7 1.7 - 2.4 mg/dL    Comment: Performed at [facility], [address] [place], [place] [address]  Urinalysis, Complete w Microscopic     Status: Abnormal   Collection Time: 08/03/18  4:54 AM  Result Value Ref Range   Color, Urine YELLOW (A) YELLOW   APPearance CLOUDY (A) CLEAR   Specific Gravity, Urine 1.013 1.005 - 1.030   pH 8.0 5.0 - 8.0   Glucose, UA NEGATIVE NEGATIVE mg/dL   Hgb urine dipstick NEGATIVE NEGATIVE   Bilirubin Urine NEGATIVE NEGATIVE   Ketones, ur NEGATIVE NEGATIVE mg/dL   Protein, ur NEGATIVE NEGATIVE mg/dL   Nitrite NEGATIVE NEGATIVE   Leukocytes,Ua TRACE (A) NEGATIVE   WBC, UA 6-10 0 - 5 WBC/hpf   Bacteria, UA NONE SEEN  NONE SEEN   Squamous Epithelial / LPF 6-10 0 - 5   Mucus PRESENT    Sperm, UA PRESENT     Comment: Performed at [facility], [address] [place], [place] [address]  ECHOCARDIOGRAM COMPLETE     Status: None    Collection Time: 08/03/18  1:20 PM  Result Value Ref Range   BP 106/51 mmHg  Comprehensive metabolic panel     Status: Abnormal   Collection Time: 08/03/18  3:01 PM  Result Value Ref Range   Sodium 139 135 - 145 mmol/L   Potassium 3.6 3.5 - 5.1 mmol/L   Chloride 110 98 - 111 mmol/L   CO2 21 (L) 22 - 32 mmol/L   Glucose, Bld 106 (H) 70 - 99 mg/dL   BUN 7 6 - 20 mg/dL   Creatinine, Ser 0.31 (L) 0.44 - 1.00 mg/dL   Calcium 8.4 (L) 8.9 - 10.3 mg/dL   Total Protein 5.8 (L) 6.5 - 8.1 g/dL   Albumin 3.0 (L) 3.5 - 5.0 g/dL   AST 13 (L) 15 - 41 U/L   ALT 10 0 - 44 U/L   Alkaline Phosphatase 96 38 - 126 U/L   Total Bilirubin 0.2 (L) 0.3 - 1.2 mg/dL   GFR calc non Af Amer >60 >60 mL/min   GFR calc Af Amer >60 >60 mL/min   Anion gap 8 5 - 15    Comment: Performed at [facility], [address] [place],  [address]  Basic metabolic panel     Status: Abnormal   Collection Time: 08/06/18  2:42 PM  Result Value Ref Range   Glucose 106 (H) 65 - 99 mg/dL   BUN 7 6 - 20 mg/dL   Creatinine, Ser 0.36 (L) 0.57 - 1.00 mg/dL   GFR calc non Af Amer 148 >59 mL/min/1.73   GFR calc Af Amer 171 >59 mL/min/1.73   BUN/Creatinine Ratio 19 9 - 23   Sodium 135 134 - 144 mmol/L   Potassium 3.3 (L) 3.5 - 5.2 mmol/L   Chloride 105 96 - 106 mmol/L   CO2 15 (L) 20 - 29 mmol/L   Calcium 8.8 8.7 - 10.2 mg/dL  TSH     Status: None   Collection Time: 08/06/18  2:42 PM  Result Value Ref Range   TSH 0.853 0.450 - 4.500 uIU/mL  Cervicovaginal ancillary only     Status: Abnormal   Collection Time: 08/14/18 12:00 AM  Result Value Ref Range   Bacterial vaginitis **POSITIVE for Gardnerella vaginalis** (A)     Comment: Normal Reference Range - Negative   Candida vaginitis Negative for Candida species     Comment: Normal Reference Range - Negative   Trichomonas Negative     Comment: Normal Reference Range - Negative  Comp Met (CMET)     Status: Abnormal   Collection Time: 08/29/18 11:11 AM  Result  Value Ref Range   Glucose 113 (H) 65 - 99 mg/dL   BUN 4 (L) 6 - 20 mg/dL   Creatinine, Ser 0.37 (L) 0.57 - 1.00 mg/dL   GFR calc non Af Amer 146 >59 mL/min/1.73   GFR calc Af Amer 168 >59 mL/min/1.73   BUN/Creatinine Ratio 11 9 - 23   Sodium 138 134 - 144 mmol/L   Potassium 3.4 (L) 3.5 - 5.2 mmol/L   Chloride 106 96 - 106 mmol/L   CO2 19 (L) 20 - 29 mmol/L   Calcium 8.6 (L) 8.7 - 10.2 mg/dL  Total Protein 5.3 (L) 6.0 - 8.5 g/dL   Albumin 3.4 (L) 3.9 - 5.0 g/dL   Globulin, Total 1.9 1.5 - 4.5 g/dL   Albumin/Globulin Ratio 1.8 1.2 - 2.2   Bilirubin Total <0.2 0.0 - 1.2 mg/dL   Alkaline Phosphatase 153 (H) 39 - 117 IU/L   AST 10 0 - 40 IU/L   ALT 11 0 - 32 IU/L  POC Urinalysis Dipstick OB     Status: None   Collection Time: 08/29/18 11:51 AM  Result Value Ref Range   Color, UA yellow    Clarity, UA clear    Glucose, UA Negative Negative   Bilirubin, UA neg    Ketones, UA neg    Spec Grav, UA 1.015 1.010 - 1.025   Blood, UA neg    pH, UA 6.0 5.0 - 8.0   POC,PROTEIN,UA Negative Negative, Trace, Small (1+), Moderate (2+), Large (3+), 4+   Urobilinogen, UA 0.2 0.2 or 1.0 E.U./dL   Nitrite, UA neg    Leukocytes, UA Negative Negative   Appearance     Odor    Urinalysis, Complete w Microscopic     Status: Abnormal   Collection Time: 09/11/18  9:24 PM  Result Value Ref Range   Color, Urine STRAW (A) YELLOW   APPearance HAZY (A) CLEAR   Specific Gravity, Urine 1.012 1.005 - 1.030   pH 8.0 5.0 - 8.0   Glucose, UA NEGATIVE NEGATIVE mg/dL   Hgb urine dipstick NEGATIVE NEGATIVE   Bilirubin Urine NEGATIVE NEGATIVE   Ketones, ur NEGATIVE NEGATIVE mg/dL   Protein, ur NEGATIVE NEGATIVE mg/dL   Nitrite NEGATIVE NEGATIVE   Leukocytes,Ua NEGATIVE NEGATIVE   RBC / HPF 0-5 0 - 5 RBC/hpf   WBC, UA 0-5 0 - 5 WBC/hpf   Bacteria, UA NONE SEEN NONE SEEN   Squamous Epithelial / LPF 0-5 0 - 5   Mucus PRESENT    Amorphous Crystal PRESENT     Comment: Performed at [facility], [address] [place], [place] [address]  Wet prep, genital     Status: Abnormal   Collection Time: 09/11/18  9:24 PM  Result Value Ref Range   Yeast Wet Prep HPF POC NONE SEEN NONE SEEN   Trich, Wet Prep NONE SEEN NONE SEEN   Clue Cells Wet Prep HPF POC PRESENT (A) NONE SEEN   WBC, Wet Prep HPF POC FEW (A) NONE SEEN   Sperm NONE SEEN     Comment: Performed at [facility], [address] [place], [place] [address]  Urine Drug Screen, Qualitative ([facility] only)     Status: None   Collection Time: 09/11/18  9:40 PM  Result Value Ref Range   Tricyclic, Ur Screen NONE DETECTED NONE DETECTED   Amphetamines, Ur Screen NONE DETECTED NONE DETECTED   MDMA (Ecstasy)Ur Screen NONE DETECTED NONE DETECTED   Cocaine Metabolite,Ur [place] NONE DETECTED NONE DETECTED   Opiate, Ur Screen NONE DETECTED NONE DETECTED   Phencyclidine (PCP) Ur S NONE DETECTED NONE DETECTED   Cannabinoid 50 Ng, Ur [place] NONE DETECTED NONE DETECTED   Barbiturates, Ur Screen NONE DETECTED NONE DETECTED   Benzodiazepine, Ur Scrn NONE DETECTED NONE DETECTED   Methadone Scn, Ur NONE DETECTED NONE DETECTED    Comment: (NOTE) Tricyclics + metabolites, urine    Cutoff 1000 ng/mL Amphetamines + metabolites, urine  Cutoff 1000 ng/mL MDMA (Ecstasy), urine              Cutoff 500 ng/mL  Cocaine Metabolite, urine          Cutoff 300 ng/mL Opiate + metabolites, urine        Cutoff 300 ng/mL Phencyclidine (PCP), urine         Cutoff 25 ng/mL Cannabinoid, urine                 Cutoff 50 ng/mL Barbiturates + metabolites, urine  Cutoff 200 ng/mL Benzodiazepine, urine              Cutoff 200 ng/mL Methadone, urine                   Cutoff 300 ng/mL The urine drug screen provides only a preliminary, unconfirmed analytical test result and should not be used for non-medical purposes. Clinical consideration and professional judgment should be applied to any positive drug screen result due to possible interfering substances. A more specific  alternate chemical method must be used in order to obtain a confirmed analytical result. Gas chromatography / mass spectrometry (GC/MS) is the preferred confirmat ory method. Performed at [facility], [address] [place], [place] [address]   Potassium     Status: None   Collection Time: 09/12/18 11:40 AM  Result Value Ref Range   Potassium 3.6 3.5 - 5.2 mmol/L  CBC     Status: Abnormal   Collection Time: 09/19/18  4:04 PM  Result Value Ref Range   WBC 8.9 4.0 - 10.5 K/uL   RBC 3.18 (L) 3.87 - 5.11 MIL/uL   Hemoglobin 10.7 (L) 12.0 - 15.0 g/dL   HCT 31.2 (L) 36.0 - 46.0 %   MCV 98.1 80.0 - 100.0 fL   MCH 33.6 26.0 - 34.0 pg   MCHC 34.3 30.0 - 36.0 g/dL   RDW 12.9 11.5 - 15.5 %   Platelets 137 (L) 150 - 400 K/uL   nRBC 0.0 0.0 - 0.2 %    Comment: Performed at [facility], [address] [place], [place] [address]  Type and screen     Status: None (Preliminary result)   Collection Time: 09/19/18  4:04 PM  Result Value Ref Range   ABO/RH(D) PENDING    Antibody Screen PENDING    Sample Expiration      09/22/2018,2359 Performed at [facility], [address] [place], [place] [address]   Comprehensive metabolic panel     Status: Abnormal   Collection Time: 09/19/18  4:04 PM  Result Value Ref Range   Sodium 137 135 - 145 mmol/L   Potassium 3.5 3.5 - 5.1 mmol/L   Chloride 110 98 - 111 mmol/L   CO2 19 (L) 22 - 32 mmol/L   Glucose, Bld 79 70 - 99 mg/dL   BUN 9 6 - 20 mg/dL   Creatinine, Ser <0.30 (L) 0.44 - 1.00 mg/dL   Calcium 8.9 8.9 - 10.3 mg/dL   Total Protein 5.9 (L) 6.5 - 8.1 g/dL   Albumin 2.9 (L) 3.5 - 5.0 g/dL   AST 16 15 - 41 U/L   ALT 10 0 - 44 U/L   Alkaline Phosphatase 217 (H) 38 - 126 U/L   Total Bilirubin 0.3 0.3 - 1.2 mg/dL   GFR calc non Af Amer NOT CALCULATED >60 mL/min   GFR calc Af Amer NOT CALCULATED >60 mL/min   Anion gap 8 5 - 15    Comment: Performed at [facility], [address], [place],  [place] [address]     Assessment &  Plan Discussed findings and reassured patient. Discussed continuous monitoring through the night. Patient to remain on bedrest. She may have some ice chips after a few hours if bleeding not increased.  Problem List Items Addressed This Visit      Other   Desires VBAC (vaginal birth after cesarean) trial   History of hypokalemia   Vaginal bleeding in pregnancy, third trimester   Relevant Orders   US OB Limited (Completed)      Meds ordered this encounter  Medications  . betamethasone acetate-betamethasone sodium phosphate (CELESTONE) 6 (3-3) MG/ML injection    Gavin Potters   : cabinet override  . betamethasone acetate-betamethasone sodium phosphate (CELESTONE) injection 12 mg  . zolpidem (AMBIEN) tablet 5 mg  . lactated ringers infusion    Dr Amalia Hailey updated on findings and agrees with plan for continued observation.  Annalysa Mohammad,CNM

## 2018-09-19 NOTE — H&P (Signed)
Obstetric History and Physical  [name] is a 28 y.o. G3P2002 with IUP at 4545w3d presenting with sudden onset around 3pm while sitting on toilet trying to have a BM. Denies contractions, or any other changes in last few days. Reports normal fetal mov't. Does have h/o previous c/s due to CPD at 41 weeks, followed by 41 weeks VBAC. Does have h/o MJ and tobacco use.  Patient states she has been having  none contractions, heavy vaginal bleeding, intact membranes, with active fetal movement.    [facility]  Pregnancy complications or risks:previous c/s followed by VBAC, MJ and tobacco use, low K+,anemia,  Prenatal labs and studies: ABO, Rh: O/Positive/-- (03/02 1036) Antibody: Negative (03/02 1036) Rubella: 1.56 (03/02 1036) RPR: Non Reactive (07/14 1003)  HBsAg: Negative (03/02 1036)  HIV: Non Reactive (03/02 1036)  GBS: unknown 1 hr Glucola  normal Genetic screening normal Anatomy US normal  Past Medical History:  Diagnosis Date  . Bipolar 1 disorder (HCC)   . Borderline personality disorder (HCC)   . Cochlear implant in place   . Hearing loss   . Interstitial cystitis   . Mental disorder    Borderline personalities disorder  . Oliguria 2013   cystoscopy, hydrodilation under anesthesia  . Polysubstance abuse (HCC)   . Vaginal Pap smear, abnormal     Past Surgical History:  Procedure Laterality Date  . Calibration of urethra with dilation of the urethra under anesthesia  04/04/2011   Dr. Rosezetta SchlatterJohn Harman  . CESAREAN SECTION  10/29/2010   Procedure: CESAREAN SECTION;  Surgeon: Lazaro ArmsLuther H Eure, MD;  Location: [facility];  Service: Gynecology;;  . CESAREAN SECTION    . COCHLEAR IMPLANT    . COCHLEAR IMPLANT    . cystoscopy, hydrodilation under anesthesia  04/04/2011    OB History  Gravida Para Term Preterm AB Living  3 2 2  0 0 2  SAB TAB Ectopic Multiple Live Births  0 0 0 0 2    # Outcome Date GA Lbr Len/2nd Weight Sex Delivery Anes PTL Lv  3 Current            2 Term 02/21/13   3572 g M Vag-Spont  N LIV  1 Term 10/29/10 6622w2d 29:01 / 03:53 4105 g M CS-LTranv EPI  LIV     Birth Comments: No dysmophic features; voided on OR table.     Social History   Socioeconomic History  . Marital status: Married    Spouse name: Not on file  . Number of children: Not on file  . Years of education: Not on file  . Highest education level: Not on file  Occupational History  . Not on file  Social Needs  . Financial resource strain: Not hard at all  . Food insecurity    Worry: Never true    Inability: Never true  . Transportation needs    Medical: No    Non-medical: No  Tobacco Use  . Smoking status: Former Smoker    Packs/day: 1.00    Years: 8.00    Pack years: 8.00    Types: Cigarettes    Quit date: 01/02/2018    Years since quitting: 0.7  . Smokeless tobacco: Never Used  Substance and Sexual Activity  . Alcohol use: Not Currently    Alcohol/week: 0.0 standard drinks  . Drug use: No  . Sexual activity: Yes    Partners: Male    Birth control/protection: None  Lifestyle  . Physical  activity    Days per week: 0 days    Minutes per session: 0 min  . Stress: Not at all  Relationships  . Social connections    Talks on phone: More than three times a week    Gets together: More than three times a week    Attends religious service: Never    Active member of club or organization: No    Attends meetings of clubs or organizations: Never    Relationship status: Not on file  Other Topics Concern  . Not on file  Social History Narrative  . Not on file    Family History  Problem Relation Age of Onset  . Hypertension Father   . Stroke Father   . Hypertension Mother   . Lung cancer Maternal Grandfather   . Syncope episode Paternal Grandmother     Medications Prior to Admission  Medication Sig Dispense Refill Last Dose  . Iron-FA-B Cmp-C-Biot-Probiotic (FUSION PLUS) CAPS Take 1 tablet by mouth daily. 30 capsule 6 09/19/2018 at Unknown  time  . potassium chloride (K-DUR) 10 MEQ tablet Take 1 tablet (10 mEq total) by mouth 4 (four) times daily. 120 tablet 1 09/19/2018 at Unknown time  . Prenatal Vit-Fe Fumarate-FA (PRENATAL VITAMINS) 28-0.8 MG TABS Take 1 tablet by mouth daily. 30 tablet 4 09/19/2018 at Unknown time    Allergies  Allergen Reactions  . Latex Rash  . Nsaids Rash  . Tylenol [Acetaminophen] Itching and Rash    Review of Systems: Negative except for what is mentioned in HPI.  Physical Exam: LMP 01/11/2018 (Approximate)  GENERAL: Well-developed, well-nourished female in no acute distress but tearful and anxious about bleeding. LUNGS: Clear to auscultation bilaterally.  HEART: Regular rate and rhythm. ABDOMEN: Soft, nontender, nondistended, gravid. EXTREMITIES: Nontender, no edema, 2+ distal pulses. Cervical Exam: Dilation: 4 Exam by:: Duff Pozzi CNM 4-4 01/18/78/OOP, moderate amount dark red blood noted in vaginal vault.  FHT:  Baseline rate 144 bpm   Variability moderate  Accelerations present   Decelerations none Contractions: Every rare and not noted by patient   Pertinent Labs/Studies:   No results found for this or any previous visit (from the past 24 hour(s)).  Assessment : Talyssa Gibas is a 28 y.o. G3P2002 at [redacted]w[redacted]d being admitted for observation due to vaginal bleeding of unknown origin at 35 weeks.  Plan: IVF started Bedside ultrasound ordered(being done now) Labs obtained- will follow up accordingly First dose betamethasone given. Continuous EFM with continuous monitoring of bleeding Complete bedrest. Neonatal consult order  Dr Amalia Hailey notified of patient admission and findings, agrees with plan of care.  Nawal Burling Gayla Medicus, CNM [facility]

## 2018-09-20 ENCOUNTER — Encounter: Payer: Self-pay | Admitting: *Deleted

## 2018-09-20 DIAGNOSIS — O26853 Spotting complicating pregnancy, third trimester: Secondary | ICD-10-CM | POA: Diagnosis not present

## 2018-09-20 DIAGNOSIS — O34219 Maternal care for unspecified type scar from previous cesarean delivery: Secondary | ICD-10-CM | POA: Diagnosis not present

## 2018-09-20 DIAGNOSIS — Z3A35 35 weeks gestation of pregnancy: Secondary | ICD-10-CM | POA: Diagnosis not present

## 2018-09-20 DIAGNOSIS — O4693 Antepartum hemorrhage, unspecified, third trimester: Secondary | ICD-10-CM | POA: Diagnosis not present

## 2018-09-20 LAB — CHLAMYDIA/NGC RT PCR (ARMC ONLY)
Chlamydia Tr: NOT DETECTED
N gonorrhoeae: NOT DETECTED

## 2018-09-20 LAB — SARS CORONAVIRUS 2 (TAT 6-24 HRS): SARS Coronavirus 2: NEGATIVE

## 2018-09-20 MED ORDER — BETAMETHASONE SOD PHOS & ACET 6 (3-3) MG/ML IJ SUSP
12.0000 mg | Freq: Once | INTRAMUSCULAR | Status: AC
  Administered 2018-09-20: 12 mg via INTRAMUSCULAR

## 2018-09-20 NOTE — Progress Notes (Signed)
Tammy Snyder is a 28 y.o. G3P2002 at [redacted]w[redacted]d who is admitted for vaginal bleeding.  Estimated Date of Delivery: 10/21/18 Fetal presentation is cephalic.  Length of Stay:  0 Days. Admitted 09/19/2018  Subjective: Denies bleeding and states contractions eased off overnight. Desires discharge home when possible. Patient reports good fetal movement.  She reports rare uterine contractions, scant dark brown bleeding and no loss of fluid per vagina.  Vitals:  Blood pressure (!) 116/50, pulse 78, temperature 98.1 F (36.7 C), temperature source Oral, resp. rate 16, height 5' 5.5" (1.664 m), weight 72.6 kg, last menstrual period 01/11/2018, SpO2 96 %. Physical Examination: CONSTITUTIONAL: Well-developed, well-nourished female in no acute distress. resting quietly in the bed with spouse sleeping in chair SKIN: Skin is warm and dry. No rash noted. Not diaphoretic. No erythema. No pallor. [place]: Alert and oriented to person, place, and time. Normal reflexes, muscle tone coordination. No cranial nerve deficit noted. PSYCHIATRIC: Normal mood and affect. Normal behavior. Normal judgment and thought content. CARDIOVASCULAR: Normal heart rate noted, regular rhythm RESPIRATORY: Effort and breath sounds normal, no problems with respiration noted MUSCULOSKELETAL: Normal range of motion. No edema and no tenderness. 2+ distal pulses. ABDOMEN: Soft, nontender, nondistended, gravid. CERVIX: not re-examined, but no blood noted on perineum Fetal monitoring: FHR: 148 bpm, Variability: moderate, Accelerations: Present, Decelerations: Absent  Uterine activity: 1-2 contractions per hour  Results for orders placed or performed during the hospital encounter of 09/19/18 (from the past 48 hour(s))  CBC     Status: Abnormal   Collection Time: 09/19/18  4:04 PM  Result Value Ref Range   WBC 8.9 4.0 - 10.5 K/uL   RBC 3.18 (L) 3.87 - 5.11 MIL/uL   Hemoglobin 10.7 (L) 12.0 - 15.0 g/dL   HCT 31.2 (L) 36.0 - 46.0 %   MCV 98.1  80.0 - 100.0 fL   MCH 33.6 26.0 - 34.0 pg   MCHC 34.3 30.0 - 36.0 g/dL   RDW 12.9 11.5 - 15.5 %   Platelets 137 (L) 150 - 400 K/uL   nRBC 0.0 0.0 - 0.2 %    Comment: Performed at [facility], [address] [place], [place] [address]  Type and screen     Status: None   Collection Time: 09/19/18  4:04 PM  Result Value Ref Range   ABO/RH(D) O POS    Antibody Screen NEG    Sample Expiration      09/22/2018,2359 Performed at [facility], [address] [place], [place] [address]   Comprehensive metabolic panel     Status: Abnormal   Collection Time: 09/19/18  4:04 PM  Result Value Ref Range   Sodium 137 135 - 145 mmol/L   Potassium 3.5 3.5 - 5.1 mmol/L   Chloride 110 98 - 111 mmol/L   CO2 19 (L) 22 - 32 mmol/L   Glucose, Bld 79 70 - 99 mg/dL   BUN 9 6 - 20 mg/dL   Creatinine, Ser <0.30 (L) 0.44 - 1.00 mg/dL   Calcium 8.9 8.9 - 10.3 mg/dL   Total Protein 5.9 (L) 6.5 - 8.1 g/dL   Albumin 2.9 (L) 3.5 - 5.0 g/dL   AST 16 15 - 41 U/L   ALT 10 0 - 44 U/L   Alkaline Phosphatase 217 (H) 38 - 126 U/L   Total Bilirubin 0.3 0.3 - 1.2 mg/dL   GFR calc non Af Amer NOT CALCULATED >60 mL/Snyder   GFR calc Af Amer NOT CALCULATED >60 mL/Snyder  Anion gap 8 5 - 15    Comment: Performed at [facility], [address] [place], [place] [address]  SARS CORONAVIRUS 2 (TAT 6-24 HRS) Nasopharyngeal Nasopharyngeal Swab     Status: None   Collection Time: 09/19/18  5:11 PM   Specimen: Nasopharyngeal Swab  Result Value Ref Range   SARS Coronavirus 2 NEGATIVE NEGATIVE    Comment: (NOTE) SARS-CoV-2 target nucleic acids are NOT DETECTED. The SARS-CoV-2 RNA is generally detectable in upper and lower respiratory specimens during the acute phase of infection. Negative results do not preclude SARS-CoV-2 infection, do not rule out co-infections with other pathogens, and should not be used as the sole basis for treatment or other patient management decisions. Negative results  must be combined with clinical observations, patient history, and epidemiological information. The expected result is Negative. Fact Sheet for Patients: HairSlick.no Fact Sheet for Healthcare Providers: quierodirigir.com This test is not yet approved or cleared by the [place] FDA and  has been authorized for detection and/or diagnosis of SARS-CoV-2 by FDA under an Emergency Use Authorization (EUA). This EUA will remain  in effect (meaning this test can be used) for the duration of the COVID-19 declaration under Section 56 4(b)(1) of the Act, 21 U.S.C. section 360bbb-3(b)(1), unless the authorization is terminated or revoked sooner. Performed at [facility], [address] [place], [place] [address]   Chlamydia/NGC rt PCR [facility] only)     Status: None   Collection Time: 09/19/18  6:18 PM   Specimen: Urine  Result Value Ref Range   Specimen source GC/Chlam URINE, RANDOM     Comment: CORRECTED ON 09/03 AT 0504: PREVIOUSLY REPORTED AS ENDOCERVICAL   Chlamydia Tr NOT DETECTED NOT DETECTED   N gonorrhoeae NOT DETECTED NOT DETECTED    Comment: (NOTE) This CT/NG assay has not been evaluated in patients with a history of  hysterectomy. Performed at [facility], [address] [place], [place] [address]   Urine Drug Screen, Qualitative [facility] only)     Status: None   Collection Time: 09/19/18  6:18 PM  Result Value Ref Range   Tricyclic, Ur Screen NONE DETECTED NONE DETECTED   Amphetamines, Ur Screen NONE DETECTED NONE DETECTED   MDMA (Ecstasy)Ur Screen NONE DETECTED NONE DETECTED   Cocaine Metabolite,Ur [place] NONE DETECTED NONE DETECTED   Opiate, Ur Screen NONE DETECTED NONE DETECTED   Phencyclidine (PCP) Ur S NONE DETECTED NONE DETECTED   Cannabinoid 50 Ng, Ur [place] NONE DETECTED NONE DETECTED   Barbiturates, Ur Screen NONE DETECTED NONE DETECTED   Benzodiazepine, Ur Scrn NONE DETECTED NONE DETECTED   Methadone  Scn, Ur NONE DETECTED NONE DETECTED    Comment: (NOTE) Tricyclics + metabolites, urine    Cutoff 1000 ng/mL Amphetamines + metabolites, urine  Cutoff 1000 ng/mL MDMA (Ecstasy), urine              Cutoff 500 ng/mL Cocaine Metabolite, urine          Cutoff 300 ng/mL Opiate + metabolites, urine        Cutoff 300 ng/mL Phencyclidine (PCP), urine         Cutoff 25 ng/mL Cannabinoid, urine                 Cutoff 50 ng/mL Barbiturates + metabolites, urine  Cutoff 200 ng/mL Benzodiazepine, urine              Cutoff 200 ng/mL Methadone, urine  Cutoff 300 ng/mL The urine drug screen provides only a preliminary, unconfirmed analytical test result and should not be used for non-medical purposes. Clinical consideration and professional judgment should be applied to any positive drug screen result due to possible interfering substances. A more specific alternate chemical method must be used in order to obtain a confirmed analytical result. Gas chromatography / mass spectrometry (GC/MS) is the preferred confirmat ory method. Performed at [facility], [address] [place], [place] [address]   SARS Coronavirus 2 [facility](Hospital order, Performed in [facility]) Nasopharyngeal Nasopharyngeal Swab     Status: None   Collection Time: 09/19/18  8:56 PM   Specimen: Nasopharyngeal Swab  Result Value Ref Range   SARS Coronavirus 2 NEGATIVE NEGATIVE    Comment: (NOTE) If result is NEGATIVE SARS-CoV-2 target nucleic acids are NOT DETECTED. The SARS-CoV-2 RNA is generally detectable in upper and lower  respiratory specimens during the acute phase of infection. The lowest  concentration of SARS-CoV-2 viral copies this assay can detect is 250  copies / mL. A negative result does not preclude SARS-CoV-2 infection  and should not be used as the sole basis for treatment or other  patient management decisions.  A negative result may occur with  improper specimen collection /  handling, submission of specimen other  than nasopharyngeal swab, presence of viral mutation(s) within the  areas targeted by this assay, and inadequate number of viral copies  (<250 copies / mL). A negative result must be combined with clinical  observations, patient history, and epidemiological information. If result is POSITIVE SARS-CoV-2 target nucleic acids are DETECTED. The SARS-CoV-2 RNA is generally detectable in upper and lower  respiratory specimens dur ing the acute phase of infection.  Positive  results are indicative of active infection with SARS-CoV-2.  Clinical  correlation with patient history and other diagnostic information is  necessary to determine patient infection status.  Positive results do  not rule out bacterial infection or co-infection with other viruses. If result is PRESUMPTIVE POSTIVE SARS-CoV-2 nucleic acids MAY BE PRESENT.   A presumptive positive result was obtained on the submitted specimen  and confirmed on repeat testing.  While 2019 novel coronavirus  (SARS-CoV-2) nucleic acids may be present in the submitted sample  additional confirmatory testing may be necessary for epidemiological  and / or clinical management purposes  to differentiate between  SARS-CoV-2 and other Sarbecovirus currently known to infect humans.  If clinically indicated additional testing with an alternate test  methodology [phone]([id number]) is advised. The SARS-CoV-2 RNA is generally  detectable in upper and lower respiratory sp ecimens during the acute  phase of infection. The expected result is Negative. Fact Sheet for Patients:  BoilerBrush.com.cyhttps://www.fda.gov/media/136312/download Fact Sheet for Healthcare Providers: https://pope.com/https://www.fda.gov/media/136313/download This test is not yet approved or cleared by the [place] FDA and has been authorized for detection and/or diagnosis of SARS-CoV-2 by FDA under an Emergency Use Authorization (EUA).  This EUA will remain in effect (meaning this  test can be used) for the duration of the COVID-19 declaration under Section 564(b)(1) of the Act, 21 U.S.C. section 360bbb-3(b)(1), unless the authorization is terminated or revoked sooner. Performed at [facility], [address] [place], [place] [address]     [place] Ob Limited  Result Date: 09/19/2018 CLINICAL DATA:  28 year old pregnant female with vaginal bleeding. Assigned gestational age of [redacted] weeks 3 days. EXAM: LIMITED OBSTETRIC ULTRASOUND FINDINGS: Number of Fetuses: 1 Heart Rate:  123 bpm Movement: Yes Presentation: Cephalic  Placental Location: Anterior Previa: No Amniotic Fluid (Subjective):  Within normal limits. BPD: 8.9 cm 35 w  6 d MATERNAL FINDINGS: Cervix:  Appears closed with cervical length measuring 2 cm. Uterus/Adnexae: No abnormality visualized. IMPRESSION: 1. Single living intrauterine gestation with assigned gestational age of [redacted] weeks 3 days. No evidence of placenta previa or gross evidence of placental abruption. 2. Closed cervix but cervical length measure 2 cm. This exam is performed on an emergent basis and does not comprehensively evaluate fetal size, dating, or anatomy; follow-up should be considered if further fetal assessment is warranted. Electronically Signed   By: Harmon PierJeffrey  Hu M.D.   On: 09/19/2018 16:42    Current scheduled medications . betamethasone acetate-betamethasone sodium phosphate  12 mg Intramuscular Once    I have reviewed the patient's current medications.  ASSESSMENT: Patient Active Problem List   Diagnosis Date Noted  . Vaginal bleeding in pregnancy, third trimester 09/19/2018  . History of hypokalemia 09/13/2018  . Indication for care in labor and delivery, antepartum 09/11/2018  . Pregnancy 08/03/2018  . Palpitations 07/04/2018  . Orthostatic lightheadedness 07/04/2018  . Syncope and collapse 07/03/2018  . Pelvic pain affecting pregnancy in second trimester, antepartum 07/03/2018  . History of chlamydia 07/02/2018  . Desires  VBAC (vaginal birth after cesarean) trial 07/02/2018  . Hearing loss 03/06/2018  . Previous cesarean section 03/06/2018  . History of VBAC 03/06/2018  . Abnormal uterine bleeding 03/16/2011    PLAN: Will allow to get up to BR and shower, otherwise continue with bedrest. After second dose betamethasone given this afternoon will discharge home if stable.IV to saline lock. May have regular diet. NST this afternoon. May transfer to MB unit.   Lauree Yurick Suzan NailerN Canyon Lohr, CNM [facility]

## 2018-09-20 NOTE — Progress Notes (Signed)
Pt has been more comfortable recently. Pt has had no vaginal bleeding noted since 0700 today. Pt up to bathroom and still had no bleeding. Vitals WDL.

## 2018-09-20 NOTE — Discharge Instructions (Signed)
Vaginal Bleeding During Pregnancy, Third Trimester ° °A small amount of bleeding (spotting) from the vagina is common during pregnancy. Sometimes the bleeding is normal and is not a problem, and sometimes it is a sign of something serious. Tell your doctor about any bleeding from your vagina right away. °Follow these instructions at home: °Activity °· Follow your doctor's instructions about how active you can be. Your doctor may recommend that you: °? Stay in bed and only get up to use the bathroom. °? Continue light activity. °· If needed, make plans for someone to help you with your normal activities. °· Ask your doctor if it is safe for you to drive. °· Do not lift anything that is heavier than 10 lb (4.5 kg) until your doctor says that this is safe. °· Do not have sex or orgasms until your doctor says that this is safe. °Medicines °· Take over-the-counter and prescription medicines only as told by your doctor. °· Do not take aspirin. It can cause bleeding. °General instructions °· Watch your condition for any changes. °· Write down: °? The number of pads you use each day. °? How often you change pads. °? How soaked (saturated) your pads are. °· Do not use tampons. °· Do not douche. °· If you pass any tissue from your vagina, save the tissue to show your doctor. °· Keep all follow-up visits as told by your doctor. This is important. °Contact a doctor if: °· You have vaginal bleeding at any time during pregnancy. °· You have cramps. °· You have a fever. °Get help right away if: °· You have very bad cramps. °· You have very bad pain in your back or belly (abdomen). °· You have a gush of fluid from your vagina. °· You pass large clots or a lot of tissue from your vagina. °· Your bleeding gets worse. °· You feel light-headed or weak. °· You pass out (faint). °· Your baby is moving less than usual, or not moving at all. °Summary °· Tell your doctor about any bleeding from your vagina right away. °· Follow instructions  from your doctor about how active you can be. You may need someone to help you with your normal activities. °This information is not intended to replace advice given to you by your health care provider. Make sure you discuss any questions you have with your health care provider. °Document Released: 05/20/2013 Document Revised: 04/24/2018 Document Reviewed: 04/06/2016 °Elsevier Patient Education © 2020 Elsevier Inc. ° °

## 2018-09-20 NOTE — Progress Notes (Signed)
Pt called out stating her bleeding had started back. Nurse went to room to assess and patient stated "I've been constipated and I finally got it out but the bleeding started back from my vagina." Nurse looked in urinary hat in toilet and noted pale yellow urine with a dime size amount of red blood dripped into the hat. Pt peri pad had just a couple of very small streaks of bright red blood. Nurse informed pt to call out if the bleeding persisted or started gushing. Vitals WDL and fetal monitors reapplied with FHT 135. RN will update provider and continue to monitor closely.

## 2018-09-25 ENCOUNTER — Other Ambulatory Visit: Payer: Medicaid Other

## 2018-09-25 ENCOUNTER — Other Ambulatory Visit: Payer: Self-pay

## 2018-09-25 ENCOUNTER — Ambulatory Visit (INDEPENDENT_AMBULATORY_CARE_PROVIDER_SITE_OTHER): Payer: Medicaid Other | Admitting: Obstetrics and Gynecology

## 2018-09-25 VITALS — BP 104/71 | HR 76 | Wt 164.4 lb

## 2018-09-25 DIAGNOSIS — Z3493 Encounter for supervision of normal pregnancy, unspecified, third trimester: Secondary | ICD-10-CM

## 2018-09-25 DIAGNOSIS — Z3A36 36 weeks gestation of pregnancy: Secondary | ICD-10-CM

## 2018-09-25 DIAGNOSIS — O4693 Antepartum hemorrhage, unspecified, third trimester: Secondary | ICD-10-CM

## 2018-09-25 LAB — POCT URINALYSIS DIPSTICK OB
Bilirubin, UA: NEGATIVE
Blood, UA: NEGATIVE
Glucose, UA: NEGATIVE
Ketones, UA: NEGATIVE
Leukocytes, UA: NEGATIVE
Nitrite, UA: NEGATIVE
POC,PROTEIN,UA: NEGATIVE
Spec Grav, UA: 1.01 (ref 1.010–1.025)
Urobilinogen, UA: 0.2 E.U./dL
pH, UA: 6.5 (ref 5.0–8.0)

## 2018-09-25 NOTE — Progress Notes (Signed)
ROB and recheck with vaginal bleeding(see hospital notes)- patient denies any bleeding in last few days. Has continued on modified bedrest at home. GBS culture obtained. Will do u/s at [facility] this week to check growth/AFI and placenta, will continue bedrest.

## 2018-09-25 NOTE — Progress Notes (Signed)
ROB- pt is having pelvic pressure 

## 2018-09-28 ENCOUNTER — Ambulatory Visit
Admission: RE | Admit: 2018-09-28 | Discharge: 2018-09-28 | Disposition: A | Discharge status: Home / Self Care | Payer: Medicaid Other | Source: Ambulatory Visit | Attending: Obstetrics and Gynecology | Admitting: Obstetrics and Gynecology

## 2018-09-28 ENCOUNTER — Other Ambulatory Visit: Payer: Self-pay

## 2018-09-28 DIAGNOSIS — O4693 Antepartum hemorrhage, unspecified, third trimester: Secondary | ICD-10-CM | POA: Diagnosis not present

## 2018-09-29 LAB — CULTURE, BETA STREP (GROUP B ONLY): Strep Gp B Culture: NEGATIVE

## 2018-10-02 ENCOUNTER — Ambulatory Visit (INDEPENDENT_AMBULATORY_CARE_PROVIDER_SITE_OTHER): Payer: Medicaid Other | Admitting: Certified Nurse Midwife

## 2018-10-02 ENCOUNTER — Other Ambulatory Visit: Payer: Self-pay

## 2018-10-02 VITALS — BP 104/67 | HR 90 | Wt 163.3 lb

## 2018-10-02 DIAGNOSIS — Z3493 Encounter for supervision of normal pregnancy, unspecified, third trimester: Secondary | ICD-10-CM

## 2018-10-02 LAB — POCT URINALYSIS DIPSTICK OB
Bilirubin, UA: NEGATIVE
Blood, UA: NEGATIVE
Glucose, UA: NEGATIVE
Nitrite, UA: NEGATIVE
Spec Grav, UA: 1.015 (ref 1.010–1.025)
Urobilinogen, UA: 0.2 E.U./dL
pH, UA: 6.5 (ref 5.0–8.0)

## 2018-10-02 NOTE — Progress Notes (Signed)
ROB-Patient c/o "blobs of mucous with blood in it" x3 days and intermittent back pain x1 week.

## 2018-10-02 NOTE — Patient Instructions (Signed)

## 2018-10-02 NOTE — Progress Notes (Signed)
ROB-Reports loss of mucous plug over the last three (3) days along with intermittent back pain. Requests SVE, unchanged since previous visit. Ultrasound completed on 09/28/2018 wnl; see below. Continue pelvic rest. Released from bedrest with twice daily kick counts.  Anticipatory guidance regarding course of prenatal care. Reviewed red flag symptoms and when to call. RTC x 1 week for ROB or sooner if needed.   CLINICAL DATA:  Vaginal bleeding in 3rd trimester pregnancy. Evaluate placenta, fetal growth, and amniotic fluid.  EXAM: OBSTETRIC 14+ WK ULTRASOUND  FINDINGS: Number of Fetuses: 1  Heart Rate:  128 bpm  Movement: Yes  Presentation: Cephalic  Previa: No  Placental Location: Anterior  Amniotic Fluid (Subjective): Within normal limits  Amniotic Fluid (Objective):  Vertical pocket 5.9cm  AFI 9.2 cm (5%ile= 7.7 cm, 95%= 24.9 cm for 36 wks)  FETAL BIOMETRY  BPD:  9.1cm 36w 5d  HC:    31.9cm 35w 6d  AC:    32.7cm 36w 4d  FL:    7.6cm 39w 0d  Current Mean GA: 37w 0d [place] EDC: 10/19/2018  Assigned GA: 36w 5d Assigned EDC: 10/21/2018  Estimated Fetal Weight:  3,132g 67%ile  FETAL ANATOMY  Lateral Ventricles: Appears normal  Thalami/CSP: Appears normal  Posterior Fossa: Not visualized  Nuchal Region: Not visualized  Upper Lip: Not visualized  Spine: Not visualized  4 Chamber Heart on Left: Appears normal  LVOT: Not visualized  RVOT: Not visualized  Stomach on Left: Appears normal  3 Vessel Cord: Appears normal  Cord Insertion site: Not visualized  Kidneys: Appears normal  Bladder: Appears normal  Extremities: Not visualized  Sex: Not visualized  Technical Limitations: Advanced gestational age and fetal position  Maternal Findings:  Cervix:  5.0 cm TA  IMPRESSION: Assigned GA currently 36 weeks 5 days. Appropriate fetal growth, with EFW at 67 %ile.  Amniotic fluid volume within normal limits,  with AFI 9.2 cm.  No evidence of placenta previa or abruption.

## 2018-10-05 ENCOUNTER — Other Ambulatory Visit: Payer: Self-pay

## 2018-10-05 ENCOUNTER — Inpatient Hospital Stay
Admission: EM | Admit: 2018-10-05 | Discharge: 2018-10-07 | DRG: 807 | Disposition: A | Discharge status: Home / Self Care | Payer: Medicaid Other | Attending: Certified Nurse Midwife | Admitting: Certified Nurse Midwife

## 2018-10-05 ENCOUNTER — Inpatient Hospital Stay: Payer: Medicaid Other | Admitting: Anesthesiology

## 2018-10-05 DIAGNOSIS — Z3A37 37 weeks gestation of pregnancy: Secondary | ICD-10-CM

## 2018-10-05 DIAGNOSIS — H9190 Unspecified hearing loss, unspecified ear: Secondary | ICD-10-CM | POA: Diagnosis present

## 2018-10-05 DIAGNOSIS — Z87891 Personal history of nicotine dependence: Secondary | ICD-10-CM | POA: Diagnosis not present

## 2018-10-05 DIAGNOSIS — O34219 Maternal care for unspecified type scar from previous cesarean delivery: Principal | ICD-10-CM | POA: Diagnosis present

## 2018-10-05 DIAGNOSIS — Z20828 Contact with and (suspected) exposure to other viral communicable diseases: Secondary | ICD-10-CM | POA: Diagnosis present

## 2018-10-05 DIAGNOSIS — Z8639 Personal history of other endocrine, nutritional and metabolic disease: Secondary | ICD-10-CM

## 2018-10-05 DIAGNOSIS — O34211 Maternal care for low transverse scar from previous cesarean delivery: Secondary | ICD-10-CM

## 2018-10-05 DIAGNOSIS — O26893 Other specified pregnancy related conditions, third trimester: Secondary | ICD-10-CM | POA: Diagnosis present

## 2018-10-05 LAB — COMPREHENSIVE METABOLIC PANEL
ALT: 14 U/L (ref 0–44)
AST: 18 U/L (ref 15–41)
Albumin: 3 g/dL — ABNORMAL LOW (ref 3.5–5.0)
Alkaline Phosphatase: 308 U/L — ABNORMAL HIGH (ref 38–126)
Anion gap: 10 (ref 5–15)
BUN: 8 mg/dL (ref 6–20)
CO2: 20 mmol/L — ABNORMAL LOW (ref 22–32)
Calcium: 8.5 mg/dL — ABNORMAL LOW (ref 8.9–10.3)
Chloride: 107 mmol/L (ref 98–111)
Creatinine, Ser: 0.34 mg/dL — ABNORMAL LOW (ref 0.44–1.00)
GFR calc Af Amer: >60 mL/min (ref >60)
GFR calc non Af Amer: >60 mL/min (ref >60)
Glucose, Bld: 86 mg/dL (ref 70–99)
Potassium: 3.5 mmol/L (ref 3.5–5.1)
Sodium: 137 mmol/L (ref 135–145)
Total Bilirubin: 0.5 mg/dL (ref 0.3–1.2)
Total Protein: 6.3 g/dL — ABNORMAL LOW (ref 6.5–8.1)

## 2018-10-05 LAB — TYPE AND SCREEN
ABO/RH(D): O POS
Antibody Screen: NEGATIVE

## 2018-10-05 LAB — CBC
HCT: 31.7 % — ABNORMAL LOW (ref 36.0–46.0)
Hemoglobin: 11 g/dL — ABNORMAL LOW (ref 12.0–15.0)
MCH: 34 pg (ref 26.0–34.0)
MCHC: 34.7 g/dL (ref 30.0–36.0)
MCV: 97.8 fL (ref 80.0–100.0)
Platelets: 166 10*3/uL (ref 150–400)
RBC: 3.24 MIL/uL — ABNORMAL LOW (ref 3.87–5.11)
RDW: 12.7 % (ref 11.5–15.5)
WBC: 10.5 10*3/uL (ref 4.0–10.5)
nRBC: 0 % (ref 0.0–0.2)

## 2018-10-05 LAB — SARS CORONAVIRUS 2 BY RT PCR (HOSPITAL ORDER, PERFORMED IN ~~LOC~~ HOSPITAL LAB): SARS Coronavirus 2: NEGATIVE

## 2018-10-05 MED ORDER — ONDANSETRON HCL 4 MG/2ML IJ SOLN: 4.0000 mg | Freq: Four times a day (QID) | INTRAMUSCULAR | Status: DC | PRN

## 2018-10-05 MED ORDER — FENTANYL 2.5 MCG/ML W/ROPIVACAINE 0.15% IN NS 100 ML EPIDURAL (ARMC)
EPIDURAL | Status: AC
  Filled 2018-10-05: qty 100

## 2018-10-05 MED ORDER — TERBUTALINE SULFATE 1 MG/ML IJ SOLN: 0.2500 mg | Freq: Once | INTRAMUSCULAR | Status: DC | PRN

## 2018-10-05 MED ORDER — LIDOCAINE HCL (PF) 1 % IJ SOLN: 30.0000 mL | INTRAMUSCULAR | Status: DC | PRN

## 2018-10-05 MED ORDER — FENTANYL 2.5 MCG/ML W/ROPIVACAINE 0.15% IN NS 100 ML EPIDURAL (ARMC)
EPIDURAL | Status: DC | PRN
  Administered 2018-10-05: 12 mL/h via EPIDURAL

## 2018-10-05 MED ORDER — FENTANYL-BUPIVACAINE-NACL 0.5-0.125-0.9 MG/250ML-% EP SOLN: 12.0000 mL/h | EPIDURAL | Status: DC | PRN

## 2018-10-05 MED ORDER — EPHEDRINE 5 MG/ML INJ: 10.0000 mg | INTRAVENOUS | Status: DC | PRN

## 2018-10-05 MED ORDER — LACTATED RINGERS IV SOLN
500.0000 mL | Freq: Once | INTRAVENOUS | Status: AC
  Administered 2018-10-05: 500 mL via INTRAVENOUS

## 2018-10-05 MED ORDER — LIDOCAINE HCL (PF) 1 % IJ SOLN
INTRAMUSCULAR | Status: AC
  Filled 2018-10-05: qty 30

## 2018-10-05 MED ORDER — LACTATED RINGERS IV SOLN
INTRAVENOUS | Status: DC
  Administered 2018-10-05: 19:00:00 via INTRAVENOUS

## 2018-10-05 MED ORDER — MISOPROSTOL 200 MCG PO TABS
ORAL_TABLET | ORAL | Status: AC
  Filled 2018-10-05: qty 4

## 2018-10-05 MED ORDER — PHENYLEPHRINE 40 MCG/ML (10ML) SYRINGE FOR IV PUSH (FOR BLOOD PRESSURE SUPPORT): 80.0000 ug | PREFILLED_SYRINGE | INTRAVENOUS | Status: DC | PRN

## 2018-10-05 MED ORDER — LACTATED RINGERS IV SOLN: 500.0000 mL | INTRAVENOUS | Status: DC | PRN

## 2018-10-05 MED ORDER — OXYTOCIN BOLUS FROM INFUSION
500.0000 mL | Freq: Once | INTRAVENOUS | Status: AC
  Administered 2018-10-05: 500 mL via INTRAVENOUS

## 2018-10-05 MED ORDER — OXYTOCIN 10 UNIT/ML IJ SOLN
INTRAMUSCULAR | Status: AC
  Filled 2018-10-05: qty 2

## 2018-10-05 MED ORDER — DIPHENHYDRAMINE HCL 50 MG/ML IJ SOLN: 12.5000 mg | INTRAMUSCULAR | Status: DC | PRN

## 2018-10-05 MED ORDER — OXYTOCIN 40 UNITS IN NORMAL SALINE INFUSION - SIMPLE MED
1.0000 m[IU]/min | INTRAVENOUS | Status: DC
  Administered 2018-10-05: 2 m[IU]/min via INTRAVENOUS

## 2018-10-05 MED ORDER — BUPIVACAINE HCL (PF) 0.25 % IJ SOLN
INTRAMUSCULAR | Status: DC | PRN
  Administered 2018-10-05 (×2): 4 mL via EPIDURAL

## 2018-10-05 MED ORDER — LIDOCAINE HCL (PF) 1 % IJ SOLN
INTRAMUSCULAR | Status: DC | PRN
  Administered 2018-10-05: 4 mL via INTRADERMAL

## 2018-10-05 MED ORDER — AMMONIA AROMATIC IN INHA
RESPIRATORY_TRACT | Status: AC
  Filled 2018-10-05: qty 10

## 2018-10-05 MED ORDER — SODIUM CHLORIDE 0.9% FLUSH: 3.0000 mL | INTRAVENOUS | Status: DC | PRN

## 2018-10-05 MED ORDER — FENTANYL CITRATE (PF) 100 MCG/2ML IJ SOLN
50.0000 ug | INTRAMUSCULAR | Status: DC | PRN
  Administered 2018-10-05: 100 ug via INTRAVENOUS
  Filled 2018-10-05: qty 2

## 2018-10-05 MED ORDER — LIDOCAINE-EPINEPHRINE (PF) 1.5 %-1:200000 IJ SOLN
INTRAMUSCULAR | Status: DC | PRN
  Administered 2018-10-05: 4 mL via PERINEURAL

## 2018-10-05 MED ORDER — SODIUM CHLORIDE 0.9% FLUSH: 3.0000 mL | Freq: Two times a day (BID) | INTRAVENOUS | Status: DC

## 2018-10-05 MED ORDER — OXYTOCIN 40 UNITS IN NORMAL SALINE INFUSION - SIMPLE MED
2.5000 [IU]/h | INTRAVENOUS | Status: DC
  Administered 2018-10-06: 2.5 [IU]/h via INTRAVENOUS
  Filled 2018-10-05: qty 1000

## 2018-10-05 MED ORDER — SOD CITRATE-CITRIC ACID 500-334 MG/5ML PO SOLN: 30.0000 mL | ORAL | Status: DC | PRN

## 2018-10-05 MED ORDER — SODIUM CHLORIDE 0.9 % IV SOLN: 250.0000 mL | INTRAVENOUS | Status: DC | PRN

## 2018-10-05 NOTE — OB Triage Note (Signed)
Pt c/o ctx since 5:30 am and she had some bloody show.

## 2018-10-05 NOTE — Progress Notes (Signed)
Tammy Snyder is a 28 y.o. G3P2002 at [redacted]w[redacted]d by LMP admitted for active labor  Subjective:  Reports pain in low back with contractions, but no change after walking for over an hour Objective: BP 105/75   Pulse 93   Temp 98 F (36.7 C) (Oral)   Resp 15   Ht 5' (1.524 m)   Wt 74.4 kg   LMP 01/11/2018 (Approximate)   BMI 32.03 kg/m  No intake/output data recorded. No intake/output data recorded.  FHT:  FHR: 130 bpm, variability: moderate,  accelerations:  Present,  decelerations:  Absent UC:   irregular, every 5-7 minutes, moderate to palpation SVE:   Dilation: 7 Effacement (%): 70 Station: -1 Exam by:: Shambley, CNM  Labs: Lab Results  Component Value Date   WBC 10.5 10/05/2018   HGB 11.0 (L) 10/05/2018   HCT 31.7 (L) 10/05/2018   MCV 97.8 10/05/2018   PLT 166 10/05/2018    Assessment / Plan: Protracted active phase  Labor: Progressing normally Preeclampsia:  labs stable Fetal Wellbeing:  Category I Pain Control:  Labor support without medications I/D:  n/a Anticipated MOD:  NSVD  [name] 10/05/2018, 4:57 PM

## 2018-10-05 NOTE — Progress Notes (Signed)
Tammy Snyder is a 28 y.o. G3P2002 at [redacted]w[redacted]d by LMP admitted for active labor  Subjective: Reports lower pelvic pressure with contractions since epidural placed.  Objective: BP 117/73 (BP Location: Left Arm)   Pulse 76   Temp 98.2 F (36.8 C) (Oral)   Resp 20   Ht 5' (1.524 m)   Wt 74.4 kg   LMP 01/11/2018 (Approximate)   SpO2 100%   BMI 32.03 kg/m  No intake/output data recorded. No intake/output data recorded.  FHT:  FHR: 140 bpm, variability: moderate,  accelerations:  Present,  decelerations:  Absent UC:   irregular, every 4-6 minutes, moderate to palpation SVE:   Dilation: 8 Effacement (%): 90 Station: -1 Exam by:: [employer] CNM  Labs: Lab Results  Component Value Date   WBC 10.5 10/05/2018   HGB 11.0 (L) 10/05/2018   HCT 31.7 (L) 10/05/2018   MCV 97.8 10/05/2018   PLT 166 10/05/2018    Assessment / Plan: Spontaneous labor, progressing normally  Labor: IUPC placed to assess uterine strength, will consider pitocin augmentation if needed, patient consent to pitocin. Preeclampsia:  labs stable Fetal Wellbeing:  Category I Pain Control:  Epidural I/D:  n/a Anticipated MOD:  NSVD  [name] 10/05/2018, 9:50 PM

## 2018-10-05 NOTE — Anesthesia Procedure Notes (Signed)
Epidural Patient location during procedure: OB  Staffing Performed: anesthesiologist   Preanesthetic Checklist Completed: patient identified, site marked, surgical consent, pre-op evaluation, timeout performed, IV checked, risks and benefits discussed and monitors and equipment checked  Epidural Patient position: sitting Prep: Betadine Patient monitoring: heart rate, continuous pulse ox and blood pressure Approach: midline Location: L4-L5 Injection technique: LOR saline  Needle:  Needle type: Tuohy  Needle gauge: 17 G Needle length: 9 cm and 9 Needle insertion depth: 4 cm Catheter type: closed end flexible Catheter size: 19 Gauge Catheter at skin depth: 9 cm Test dose: negative and 1.5% lidocaine with Epi 1:200 K  Assessment Sensory level: T10 Events: blood not aspirated, injection not painful, no injection resistance, negative IV test and no paresthesia  Additional Notes   Patient tolerated the insertion well without complications.-SATD -IVTD. No paresthesia. Refer to [place] for VS and dosingReason for block:procedure for pain

## 2018-10-05 NOTE — Progress Notes (Signed)
Tammy Snyder is a 28 y.o. G3P2002 at [redacted]w[redacted]d by LMP admitted for active labor and attempted VBAC.  Subjective: Denies increase in pain  Objective: BP 110/70 (BP Location: Left Arm)   Pulse 77   Temp 98 F (36.7 C) (Oral)   Resp 16   Ht 5' (1.524 m)   Wt 74.4 kg   LMP 01/11/2018 (Approximate)   BMI 32.03 kg/m  No intake/output data recorded. No intake/output data recorded.  FHT:  FHR: 133 bpm, variability: moderate,  accelerations:  Present,  decelerations:  Absent UC:   irregular, every 6-9 minutes SVE:   Dilation: 7 Effacement (%): 70 Station: -1 Exam by:: Shambley, CNM  Labs: Lab Results  Component Value Date   WBC 10.5 10/05/2018   HGB 11.0 (L) 10/05/2018   HCT 31.7 (L) 10/05/2018   MCV 97.8 10/05/2018   PLT 166 10/05/2018    Assessment / Plan: Protracted active phase  Labor: AROM with scant amount clear fluid and bloody show noted Preeclampsia:  labs stable Fetal Wellbeing:  Category I Pain Control:  Labor support without medications I/D:  n/a Anticipated MOD:  NSVD  [name] 10/05/2018, 6:07 PM

## 2018-10-05 NOTE — Progress Notes (Signed)
Tammy Snyder is a 27 y.o. G3P2002 at [redacted]w[redacted]d by LMP admitted for active labor  Subjective:  Reports increased pressure and urge to push with contractions, leaking copious amounts of clear fluid Objective: BP 110/70 (BP Location: Left Arm)   Pulse 77   Temp 98 F (36.7 C) (Oral)   Resp 16   Ht 5' (1.524 m)   Wt 74.4 kg   LMP 01/11/2018 (Approximate)   BMI 32.03 kg/m  No intake/output data recorded. No intake/output data recorded.  FHT:  FHR: 133 bpm, variability: moderate,  accelerations:  Present,  decelerations:  Absent UC:   regular, every 2-3 minutes SVE:   Dilation: 7 Effacement (%): 80 Station: -1 Exam by:: Tammy Snyder, CNM  Labs: Lab Results  Component Value Date   WBC 10.5 10/05/2018   HGB 11.0 (L) 10/05/2018   HCT 31.7 (L) 10/05/2018   MCV 97.8 10/05/2018   PLT 166 10/05/2018    Assessment / Plan: Spontaneous labor, progressing normally, up to birthing ball. May consider IV pain med in near future. Declines epidural.  Labor: Progressing normally Preeclampsia:  labs stable Fetal Wellbeing:  Category I Pain Control:  Labor support without medications I/D:  n/a Anticipated MOD:  NSVD  Tammy Snyder N Tammy Snyder 10/05/2018, 7:25 PM

## 2018-10-05 NOTE — H&P (Signed)
Obstetric History and Physical  Tammy Snyder is a 28 y.o. G3P2002 with IUP at 8658w5d presenting with irregular contractions and low back pain for labor check. Patient states she has been having  irregular, every 4-5 minutes contractions, none vaginal bleeding, intact membranes, with active fetal movement.    [facility]  Pregnancy complications or risks:previous c/s, with successful VBAC; third trimester bleeding(resolved), smoker, anemia,   Prenatal labs and studies: ABO, Rh: --/--/O POS (09/02 1604) Antibody: NEG (09/02 1604) Rubella: 1.56 (03/02 1036) RPR: Non Reactive (07/14 1003)  HBsAg: Negative (03/02 1036)  HIV: Non Reactive (03/02 1036)  ZOX:WRUEAVWU/GBS:Negative/-- (09/08 1355) 1 hr Glucola  normal Genetic screening normal Anatomy US normal  Past Medical History:  Diagnosis Date  . Bipolar 1 disorder (HCC)   . Borderline personality disorder (HCC)   . Cochlear implant in place   . Hearing loss   . Interstitial cystitis   . Mental disorder    Borderline personalities disorder  . Oliguria 2013   cystoscopy, hydrodilation under anesthesia  . Polysubstance abuse (HCC)   . Vaginal Pap smear, abnormal     Past Surgical History:  Procedure Laterality Date  . Calibration of urethra with dilation of the urethra under anesthesia  04/04/2011   Dr. Rosezetta SchlatterJohn Harman  . CESAREAN SECTION  10/29/2010   Procedure: CESAREAN SECTION;  Surgeon: Lazaro ArmsLuther H Eure, MD;  Location: [facility];  Service: Gynecology;;  . CESAREAN SECTION    . COCHLEAR IMPLANT    . COCHLEAR IMPLANT    . cystoscopy, hydrodilation under anesthesia  04/04/2011    OB History  Gravida Para Term Preterm AB Living  3 2 2  0 0 2  SAB TAB Ectopic Multiple Live Births  0 0 0 0 2    # Outcome Date GA Lbr Len/2nd Weight Sex Delivery Anes PTL Lv  3 Current           2 Term 02/21/13   3572 g M Vag-Spont  N LIV  1 Term 10/29/10 7431w2d 29:01 / 03:53 4105 g M CS-LTranv EPI  LIV     Birth Comments: No dysmophic  features; voided on OR table.     Social History   Socioeconomic History  . Marital status: Married    Spouse name: Not on file  . Number of children: Not on file  . Years of education: Not on file  . Highest education level: Not on file  Occupational History  . Not on file  Social Needs  . Financial resource strain: Not hard at all  . Food insecurity    Worry: Never true    Inability: Never true  . Transportation needs    Medical: No    Non-medical: No  Tobacco Use  . Smoking status: Former Smoker    Packs/day: 1.00    Years: 8.00    Pack years: 8.00    Types: Cigarettes    Quit date: 01/02/2018    Years since quitting: 0.7  . Smokeless tobacco: Never Used  Substance and Sexual Activity  . Alcohol use: Not Currently    Alcohol/week: 0.0 standard drinks  . Drug use: No  . Sexual activity: Yes    Partners: Male    Birth control/protection: None  Lifestyle  . Physical activity    Days per week: 0 days    Minutes per session: 0 min  . Stress: Not at all  Relationships  . Social connections    Talks on phone: More than  three times a week    Gets together: More than three times a week    Attends religious service: Never    Active member of club or organization: No    Attends meetings of clubs or organizations: Never    Relationship status: Not on file  Other Topics Concern  . Not on file  Social History Narrative  . Not on file    Family History  Problem Relation Age of Onset  . Hypertension Father   . Stroke Father   . Hypertension Mother   . Lung cancer Maternal Grandfather   . Syncope episode Paternal Grandmother     Medications Prior to Admission  Medication Sig Dispense Refill Last Dose  . Iron-FA-B Cmp-C-Biot-Probiotic (FUSION PLUS) CAPS Take 1 tablet by mouth daily. 30 capsule 6   . potassium chloride (K-DUR) 10 MEQ tablet Take 1 tablet (10 mEq total) by mouth 4 (four) times daily. 120 tablet 1   . Prenatal Vit-Fe Fumarate-FA (PRENATAL VITAMINS)  28-0.8 MG TABS Take 1 tablet by mouth daily. 30 tablet 4     Allergies  Allergen Reactions  . Latex Rash  . Nsaids Rash  . Tylenol [Acetaminophen] Itching and Rash    Review of Systems: Negative except for what is mentioned in HPI.  Physical Exam: BP 107/75 (BP Location: Left Arm)   Pulse (!) 103   Temp 98.4 F (36.9 C) (Oral)   Resp 12   LMP 01/11/2018 (Approximate)  GENERAL: Well-developed, well-nourished female in no acute distress.  LUNGS: Clear to auscultation bilaterally.  HEART: Regular rate and rhythm. ABDOMEN: Soft, nontender, nondistended, gravid. EXTREMITIES: Nontender, no edema, 2+ distal pulses. Cervical Exam: Dilation: 6 Effacement (%): 70 Cervical Position: Posterior Station: -2 Presentation: Vertex Exam by:: Reece Leader, RN FHT:  Baseline rate 138 bpm   Variability moderate  Accelerations present   Decelerations none Contractions: Every 3-5 mins   Pertinent Labs/Studies:   No results found for this or any previous visit (from the past 24 hour(s)).  Assessment : Tammy Snyder is a 28 y.o. G3P2002 at [redacted]w[redacted]d being admitted for trial of labor after c/s. Dr Marcelline Mates aware of admission and is available if needed. Plan: Labor: Expectant management.  Induction/Augmentation as needed, per protocol FWB: Reassuring fetal heart tracing.  GBS negative Delivery plan: Hopeful for vaginal delivery   , CNM [facility], [facility]

## 2018-10-05 NOTE — Anesthesia Preprocedure Evaluation (Signed)
Anesthesia Evaluation  Patient identified by MRN, date of birth, ID band Patient awake    Reviewed: Allergy & Precautions, H&P , NPO status , Patient's Chart, lab work & pertinent test results, reviewed documented beta blocker date and time   Airway Mallampati: II  TM Distance: >3 FB Neck ROM: full    Dental no notable dental hx. (+) Teeth Intact   Pulmonary neg pulmonary ROS, Current Smoker and Patient abstained from smoking., former smoker,    Pulmonary exam normal breath sounds clear to auscultation       Cardiovascular Exercise Tolerance: Good negative cardio ROS   Rhythm:regular Rate:Normal     Neuro/Psych PSYCHIATRIC DISORDERS Bipolar Disorder negative neurological ROS     GI/Hepatic negative GI ROS, Neg liver ROS,   Endo/Other  negative endocrine ROSdiabetes, Gestational  Renal/GU      Musculoskeletal   Abdominal   Peds  Hematology negative hematology ROS (+)   Anesthesia Other Findings   Reproductive/Obstetrics (+) Pregnancy                             Anesthesia Physical Anesthesia Plan  ASA: II  Anesthesia Plan: Epidural   Post-op Pain Management:    Induction:   PONV Risk Score and Plan:   Airway Management Planned:   Additional Equipment:   Intra-op Plan:   Post-operative Plan:   Informed Consent: I have reviewed the patients History and Physical, chart, labs and discussed the procedure including the risks, benefits and alternatives for the proposed anesthesia with the patient or authorized representative who has indicated his/her understanding and acceptance.       Plan Discussed with:   Anesthesia Plan Comments:         Anesthesia Quick Evaluation

## 2018-10-06 LAB — CBC
HCT: 34.4 % — ABNORMAL LOW (ref 36.0–46.0)
Hemoglobin: 11.7 g/dL — ABNORMAL LOW (ref 12.0–15.0)
MCH: 33.3 pg (ref 26.0–34.0)
MCHC: 34 g/dL (ref 30.0–36.0)
MCV: 98 fL (ref 80.0–100.0)
Platelets: 148 10*3/uL — ABNORMAL LOW (ref 150–400)
RBC: 3.51 MIL/uL — ABNORMAL LOW (ref 3.87–5.11)
RDW: 12.4 % (ref 11.5–15.5)
WBC: 15.9 10*3/uL — ABNORMAL HIGH (ref 4.0–10.5)
nRBC: 0 % (ref 0.0–0.2)

## 2018-10-06 LAB — RPR: RPR Ser Ql: NONREACTIVE

## 2018-10-06 MED ORDER — COCONUT OIL OIL
1.0000 "application " | TOPICAL_OIL | Status: DC | PRN
  Administered 2018-10-06: 1 via TOPICAL
  Filled 2018-10-06 (×2): qty 120

## 2018-10-06 MED ORDER — OXYCODONE HCL 5 MG PO TABS
5.0000 mg | ORAL_TABLET | ORAL | Status: DC | PRN
  Administered 2018-10-06 – 2018-10-07 (×4): 5 mg via ORAL
  Filled 2018-10-06 (×4): qty 1

## 2018-10-06 MED ORDER — BENZOCAINE-MENTHOL 20-0.5 % EX AERO
1.0000 "application " | INHALATION_SPRAY | CUTANEOUS | Status: DC | PRN
  Administered 2018-10-06: 1 via TOPICAL
  Filled 2018-10-06: qty 56

## 2018-10-06 MED ORDER — DIBUCAINE (PERIANAL) 1 % EX OINT
1.0000 "application " | TOPICAL_OINTMENT | CUTANEOUS | Status: DC | PRN
  Filled 2018-10-06: qty 28

## 2018-10-06 MED ORDER — ONDANSETRON HCL 4 MG PO TABS: 4.0000 mg | ORAL_TABLET | ORAL | Status: DC | PRN

## 2018-10-06 MED ORDER — HYDROMORPHONE HCL 2 MG PO TABS
2.0000 mg | ORAL_TABLET | ORAL | Status: DC | PRN
  Administered 2018-10-06 (×2): 2 mg via ORAL
  Filled 2018-10-06 (×2): qty 1

## 2018-10-06 MED ORDER — DIPHENHYDRAMINE HCL 25 MG PO CAPS: 25.0000 mg | ORAL_CAPSULE | Freq: Four times a day (QID) | ORAL | Status: DC | PRN

## 2018-10-06 MED ORDER — DOCUSATE SODIUM 100 MG PO CAPS
100.0000 mg | ORAL_CAPSULE | Freq: Two times a day (BID) | ORAL | Status: DC
  Administered 2018-10-06 (×2): 100 mg via ORAL
  Filled 2018-10-06 (×2): qty 1

## 2018-10-06 MED ORDER — SIMETHICONE 80 MG PO CHEW: 80.0000 mg | CHEWABLE_TABLET | ORAL | Status: DC | PRN

## 2018-10-06 MED ORDER — PRENATAL MULTIVITAMIN CH
1.0000 | ORAL_TABLET | Freq: Every day | ORAL | Status: DC
  Administered 2018-10-06 – 2018-10-07 (×2): 1 via ORAL
  Filled 2018-10-06 (×2): qty 1

## 2018-10-06 MED ORDER — WITCH HAZEL-GLYCERIN EX PADS
1.0000 "application " | MEDICATED_PAD | CUTANEOUS | Status: DC | PRN
  Administered 2018-10-06: 1 via TOPICAL
  Filled 2018-10-06: qty 100

## 2018-10-06 MED ORDER — ONDANSETRON HCL 4 MG/2ML IJ SOLN: 4.0000 mg | INTRAMUSCULAR | Status: DC | PRN

## 2018-10-06 MED ORDER — OXYCODONE HCL 5 MG PO TABS
5.0000 mg | ORAL_TABLET | Freq: Four times a day (QID) | ORAL | Status: DC | PRN
  Administered 2018-10-06 (×2): 5 mg via ORAL
  Filled 2018-10-06 (×2): qty 1

## 2018-10-06 NOTE — Progress Notes (Signed)
Patient ID: Tammy Snyder, female   DOB: 1990/06/28, 28 y.o.   MRN: [id number]  Post Partum Day # 1, s/p spontaneous vagina birth, successful vaginal birth after cesarean section, Rh positive, GBS negative, Breastfeeding  Subjective:  Patient lying in bed on right side, reports intermittent abdominal pain and cramping.   Denies difficulty breathing or respiratory distress, chest pain, excessive vaginal bleeding, dysuria, and leg pain or swelling.   Infant sleeping in bassinet at bedside.   Objective:  Temp:  [97.8 F (36.6 C)-98.6 F (37 C)] 98.6 F (37 C) (09/19 1105) Pulse Rate:  [68-153] 75 (09/19 1105) Resp:  [12-20] 20 (09/19 1105) BP: (79-130)/(30-95) 116/78 (09/19 1105) SpO2:  [97 %-100 %] 98 % (09/19 0803) Weight:  [74.4 kg] 74.4 kg (09/18 1500)  Physical Exam:   General: alert and cooperative   Lungs: clear to auscultation bilaterally  Breasts: deferred, no complaints  Heart: normal apical impulse  Abdomen: soft, non-tender; bowel sounds normal; no masses,  no organomegaly  Pelvis: Lochia: appropriate, Uterine Fundus: firm  Extremities: DVT Evaluation: No evidence of DVT seen on physical exam. Negative Homan's sign.  Recent Labs    10/05/18 1439 10/06/18 0615  HGB 11.0* 11.7*  HCT 31.7* 34.4*    Assessment:  28 year old, G3P3, Post Partum Day # 1, s/p spontaneous vagina birth, successful vaginal birth after cesarean section, Rh positive, GBS negative  Breastfeeding  Plan:  May have heating pad, see orders.   Routine postpartum care and education.   Reviewed red flag symptoms and when to call.   Continue orders as written. Reassess as needed.   Anticipate discharge tomorrow.    LOS: 1 day   Diona Fanti, CNM [facility], [facility] 10/06/2018 12:01 PM

## 2018-10-06 NOTE — Anesthesia Postprocedure Evaluation (Signed)
Anesthesia Post Note  Patient: Tammy Snyder  Procedure(s) Performed: AN AD HOC LABOR EPIDURAL  Patient location during evaluation: Mother Baby Anesthesia Type: Epidural Level of consciousness: awake and alert Pain management: pain level controlled Vital Signs Assessment: post-procedure vital signs reviewed and stable Respiratory status: spontaneous breathing, nonlabored ventilation and respiratory function stable Cardiovascular status: stable Postop Assessment: no headache, no backache and epidural receding Anesthetic complications: no     Last Vitals:  Vitals:   10/06/18 0803 10/06/18 1105  BP: 108/69 116/78  Pulse: 68 75  Resp: 18 20  Temp: 37 C 37 C  SpO2: 98%     Last Pain:  Vitals:   10/06/18 1229  TempSrc:   PainSc: 7                  KEPHART,WILLIAM K

## 2018-10-07 LAB — COMPREHENSIVE METABOLIC PANEL
ALT: 16 U/L (ref 0–44)
AST: 28 U/L (ref 15–41)
Albumin: 2.8 g/dL — ABNORMAL LOW (ref 3.5–5.0)
Alkaline Phosphatase: 242 U/L — ABNORMAL HIGH (ref 38–126)
Anion gap: 13 (ref 5–15)
BUN: 9 mg/dL (ref 6–20)
CO2: 23 mmol/L (ref 22–32)
Calcium: 8.9 mg/dL (ref 8.9–10.3)
Chloride: 104 mmol/L (ref 98–111)
Creatinine, Ser: 0.51 mg/dL (ref 0.44–1.00)
GFR calc Af Amer: >60 mL/min (ref >60)
GFR calc non Af Amer: >60 mL/min (ref >60)
Glucose, Bld: 90 mg/dL (ref 70–99)
Potassium: 3.5 mmol/L (ref 3.5–5.1)
Sodium: 140 mmol/L (ref 135–145)
Total Bilirubin: 0.3 mg/dL (ref 0.3–1.2)
Total Protein: 5.9 g/dL — ABNORMAL LOW (ref 6.5–8.1)

## 2018-10-07 LAB — CBC
HCT: 33.6 % — ABNORMAL LOW (ref 36.0–46.0)
Hemoglobin: 11.4 g/dL — ABNORMAL LOW (ref 12.0–15.0)
MCH: 33.3 pg (ref 26.0–34.0)
MCHC: 33.9 g/dL (ref 30.0–36.0)
MCV: 98.2 fL (ref 80.0–100.0)
Platelets: 153 10*3/uL (ref 150–400)
RBC: 3.42 MIL/uL — ABNORMAL LOW (ref 3.87–5.11)
RDW: 12.9 % (ref 11.5–15.5)
WBC: 14.1 10*3/uL — ABNORMAL HIGH (ref 4.0–10.5)
nRBC: 0 % (ref 0.0–0.2)

## 2018-10-07 MED ORDER — DOCUSATE SODIUM 100 MG PO CAPS: 100.0000 mg | ORAL_CAPSULE | Freq: Two times a day (BID) | ORAL | 0 refills | Status: DC

## 2018-10-07 MED ORDER — WITCH HAZEL-GLYCERIN EX PADS: 1.0000 "application " | MEDICATED_PAD | CUTANEOUS | 12 refills | Status: DC | PRN

## 2018-10-07 MED ORDER — OXYCODONE HCL 5 MG PO TABS: 5.0000 mg | ORAL_TABLET | ORAL | 0 refills | Status: DC | PRN

## 2018-10-07 NOTE — Discharge Instructions (Signed)

## 2018-10-07 NOTE — Discharge Summary (Addendum)
Obstetric Discharge Summary  Patient ID: Tammy Snyder MRN: [id number] DOB/AGE: 1990-03-22 28 y.o.   Date of Admission: 10/05/2018  Date of Discharge:  10/07/18  Admitting Diagnosis: Onset of Labor at 371w5d  Secondary Diagnosis: Hearing loss, History of previous cesarean section followed by spontaneous vaginal birth, History of hypokalemia, Orthostatic lightheadedness, Palpitations, Syncope and collapse  Mode of Delivery: Normal spontaneous vaginal delivery     Discharge Diagnosis: No other diagnosis   Intrapartum Procedures: Atificial rupture of membranes, epidural and pitocin augmentation   Post partum procedures: None  Complications: None   Brief Hospital Course   Tammy Snyder is a [id number] who had a SVD on 10/05/2018;  for further details of this birth, please refer to the delivey summary.  Patient had an uncomplicated postpartum course.  By time of discharge on PPD#2, her pain was controlled on oral pain medications; she had appropriate lochia and was ambulating, voiding without difficulty and tolerating regular diet.  She was deemed stable for discharge to home.    Labs:  CBC Latest Ref Rng & Units 10/07/2018 10/06/2018 10/05/2018  WBC 4.0 - 10.5 K/uL 14.1(H) 15.9(H) 10.5  Hemoglobin 12.0 - 15.0 g/dL 11.4(L) 11.7(L) 11.0(L)  Hematocrit 36.0 - 46.0 % 33.6(L) 34.4(L) 31.7(L)  Platelets 150 - 400 K/uL 153 148(L) 166   CMP Latest Ref Rng & Units 10/07/2018 10/05/2018 09/19/2018  Glucose 70 - 99 mg/dL 90 86 79  BUN 6 - 20 mg/dL 9 8 9   Creatinine 0.44 - 1.00 mg/dL 6.290.51 5.28(U0.34(L) <1.32(G<0.30(L)  Sodium 135 - 145 mmol/L 140 137 137  Potassium 3.5 - 5.1 mmol/L 3.5 3.5 3.5  Chloride 98 - 111 mmol/L 104 107 110  CO2 22 - 32 mmol/L 23 20(L) 19(L)  Calcium 8.9 - 10.3 mg/dL 8.9 4.0(N8.5(L) 8.9  Total Protein 6.5 - 8.1 g/dL 5.9(L) 6.3(L) 5.9(L)  Total Bilirubin 0.3 - 1.2 mg/dL 0.3 0.5 0.3  Alkaline Phos 38 - 126 U/L 242(H) 308(H) 217(H)  AST 15 - 41 U/L 28 18 16   ALT 0 - 44 U/L 16 14 10     O  POS  Physical exam:   Temp:  [98 F (36.7 C)-98.6 F (37 C)] 98.3 F (36.8 C) (09/20 0749) Pulse Rate:  [61-78] 69 (09/20 0749) Resp:  [16-20] 18 (09/20 0749) BP: (110-118)/(59-81) 111/71 (09/20 0749) SpO2:  [97 %-99 %] 97 % (09/20 0749)  General: alert and no distress  Lochia: appropriate  Abdomen: soft, NT  Uterine Fundus: firm  Perineum: no significant drainage, no dehiscence, no significant erythema  Extremities: No evidence of DVT seen on physical exam. No lower extremity edema  Edinburgh Postnatal Depression Scale Screening Tool 10/06/2018 10/06/2018  I have been able to laugh and see the funny side of things. 0 (No Data)  I have looked forward with enjoyment to things. 0 -  I have blamed myself unnecessarily when things went wrong. 1 -  I have been anxious or worried for no good reason. 0 -  I have felt scared or panicky for no good reason. 1 -  Things have been getting on top of me. 0 -  I have been so unhappy that I have had difficulty sleeping. 0 -  I have felt sad or miserable. 0 -  I have been so unhappy that I have been crying. 0 -  The thought of harming myself has occurred to me. 0 -  Edinburgh Postnatal Depression Scale Total 2 -    Discharge Instructions: Per After Visit Summary.  Activity:  Advance as tolerated. Pelvic rest for 6 weeks.  Also refer to After Visit Summary  Diet: Regular  Medications: Allergies as of 10/07/2018      Reactions   Latex Rash   Nsaids Rash   Tylenol [acetaminophen] Itching, Rash      Medication List    STOP taking these medications   potassium chloride 10 MEQ tablet Commonly known as: K-DUR     TAKE these medications   docusate sodium 100 MG capsule Commonly known as: COLACE Take 1 capsule (100 mg total) by mouth 2 (two) times daily.   Fusion Plus Caps Take 1 tablet by mouth daily.   oxyCODONE 5 MG immediate release tablet Commonly known as: Oxy IR/ROXICODONE Take 1 tablet (5 mg total) by mouth every 4  (four) hours as needed for moderate pain.   Prenatal Vitamins 28-0.8 MG Tabs Take 1 tablet by mouth daily.   witch hazel-glycerin pad Commonly known as: TUCKS Apply 1 application topically as needed for hemorrhoids.      Outpatient follow up:  Follow-up Information    Barrett, Raiford Simmonds, CNM. Schedule an appointment as soon as possible for a visit.   Specialties: Obstetrics and Gynecology, Radiology Why: Please call to schedule six (6) week postpartum visit.  Contact information: [place] [address] [phone]        [facility]. Schedule an appointment as soon as possible for a visit.   Why: Please call to make an appointment for Nexplanon insertion in eight (8) weeks or sooner if desired.  Contact information: [place]  [place] [phone]         Postpartum contraception: Nexplanon  Discharged Condition: stable  Discharged to: home   Newborn Data:  Disposition:home with mother  Apgars: APGAR (1 MIN):    Delainee, Tramel [[id number]]     Permelia, Bamba [[id number]]  8    APGAR (5 MINS):    Tabitha, Riggins [[id number]]     Necia, Kamm [[id number]]  9    Baby Feeding: Breast   Diona Fanti, CNM [facility], [facility] 10/07/18 10:57 AM

## 2018-10-07 NOTE — Progress Notes (Signed)
Reviewed D/C instructions with pt who verbalized understanding of teaching. Discharged from care, will room-in with baby as peds patient (under phototherapy). Pt to schedule f/u appt in 6 weeks.

## 2018-10-09 ENCOUNTER — Encounter: Payer: Medicaid Other | Admitting: Certified Nurse Midwife

## 2018-11-20 ENCOUNTER — Encounter: Payer: Medicaid Other | Admitting: Obstetrics and Gynecology

## 2018-12-04 ENCOUNTER — Ambulatory Visit: Payer: Medicaid Other | Admitting: Obstetrics and Gynecology

## 2019-01-08 ENCOUNTER — Telehealth: Payer: Self-pay

## 2019-01-08 NOTE — Telephone Encounter (Signed)
Called the preferred phone number listed- mailbox has not been setup. Called the second number [phone] and spoke with grandmother who reports patient is in jail. Patient had an appointment for a Nexplanon insertion on 01/09/19 which will be cancelled due to incarceration of patient. Grandmother will call and reschedule appointment.

## 2019-01-09 ENCOUNTER — Ambulatory Visit: Payer: Medicaid Other | Admitting: Certified Nurse Midwife

## 2019-01-18 NOTE — L&D Delivery Note (Addendum)
Delivery Note  0715 In room to see patient, reports pelvic pressure and the urge to push. SVE: 10/100/+3, vertex.   Room prepared for second stage.   Spontaneous vaginal birth of liveborn female infant in left occiput posterior position over intact perineum at 0723. Infant immediately to maternal abdomen, delayed cord clamping, skin to skin, three (3) vessel cord and tube of cord blood collected. APGARs: 8, 9. Weight pending. Receiving nurse present at bedside for birth.   Pitocin bolus infusing. Spontaneous delivery of intact placenta at 0730. Uterus firm. Rubra small. Right labial skidmark hemostatic, unrepaired. EBL: 250 ml. Vault check completed under adequate epidural anesthesia. Counts correct x 2.   Initiate routine postpartum care and orders. Mom to postpartum.  Baby to Couplet care / Skin to Skin.  FOB present at bedside and overjoyed with the birth of "Tammy Snyder".   [id number] Dr. Logan Bores notified of patient's desires for permanent sterilization. Bilateral tubal ligation consent placed on chart and orders signed.    Serafina Royals, CNM [facility], [facility] 10/05/2019, 7:44 AM

## 2019-01-31 NOTE — Progress Notes (Signed)
Letter printed and mailed to pt. Encouraged pt to contact the office to review test results.

## 2019-04-05 ENCOUNTER — Telehealth: Payer: Self-pay | Admitting: Certified Nurse Midwife

## 2019-04-05 NOTE — Telephone Encounter (Signed)
Pt called in and stated that she had a + pregnancy test last week and this week she is having cramping left side. I called back and talked to the nurse and she advised the pt to go to the ED

## 2019-04-09 ENCOUNTER — Other Ambulatory Visit: Payer: Self-pay | Admitting: Certified Nurse Midwife

## 2019-04-09 ENCOUNTER — Ambulatory Visit (INDEPENDENT_AMBULATORY_CARE_PROVIDER_SITE_OTHER): Payer: Medicaid Other | Admitting: Certified Nurse Midwife

## 2019-04-09 ENCOUNTER — Other Ambulatory Visit: Payer: Self-pay

## 2019-04-09 ENCOUNTER — Encounter: Payer: Self-pay | Admitting: Certified Nurse Midwife

## 2019-04-09 ENCOUNTER — Other Ambulatory Visit (INDEPENDENT_AMBULATORY_CARE_PROVIDER_SITE_OTHER): Payer: Medicaid Other

## 2019-04-09 VITALS — BP 106/61 | HR 76 | Wt 143.2 lb

## 2019-04-09 DIAGNOSIS — Z3482 Encounter for supervision of other normal pregnancy, second trimester: Secondary | ICD-10-CM

## 2019-04-09 DIAGNOSIS — N926 Irregular menstruation, unspecified: Secondary | ICD-10-CM

## 2019-04-09 DIAGNOSIS — Z789 Other specified health status: Secondary | ICD-10-CM

## 2019-04-09 DIAGNOSIS — Z3689 Encounter for other specified antenatal screening: Secondary | ICD-10-CM

## 2019-04-09 DIAGNOSIS — Z3A13 13 weeks gestation of pregnancy: Secondary | ICD-10-CM | POA: Diagnosis not present

## 2019-04-09 LAB — POCT URINE PREGNANCY: Preg Test, Ur: POSITIVE — AB

## 2019-04-09 LAB — OB RESULTS CONSOLE VARICELLA ZOSTER ANTIBODY, IGG: Varicella: IMMUNE

## 2019-04-09 NOTE — Progress Notes (Signed)
NEW OB HISTORY AND PHYSICAL  SUBJECTIVE:       Tammy Snyder is a 29 y.o. [id number] female, No LMP recorded. Patient is pregnant., Estimated Date of Delivery: None noted., Unknown, presents today for establishment of Prenatal Care. 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 Patient is pregnant. Normal Contraception: none Last Pap: 04/06/18. Results were: normal  Obstetric History OB History  Gravida Para Term Preterm AB Living  4 3 3  0 0 3  SAB TAB Ectopic Multiple Live Births  0 0 0 0 3    # Outcome Date GA Lbr Len/2nd Weight Sex Delivery Anes PTL Lv  4 Current           3 Term 10/05/18 [redacted]w[redacted]d 389:29 / 00:16 6 lb 13 oz (3.09 kg) F Vag-Spont EPI  LIV  2 Term 02/21/13   7 lb 14 oz (3.572 kg) M Vag-Spont  N LIV  1 Term 10/29/10 [redacted]w[redacted]d 29:01 / 03:53 9 lb 0.8 oz (4.105 kg) M CS-LTranv EPI  LIV     Birth Comments: No dysmophic features; voided on OR table.     Past Medical History:  Diagnosis Date  . Bipolar 1 disorder ([place])   . Borderline personality disorder ([place])   . Cochlear implant in place   . Hearing loss   . Interstitial cystitis   . Mental disorder    Borderline personalities disorder  . Oliguria 2013   cystoscopy, hydrodilation under anesthesia  . Polysubstance abuse ([place])   . Vaginal Pap smear, abnormal     Past Surgical History:  Procedure Laterality Date  . Calibration of urethra with dilation of the urethra under anesthesia  04/04/2011   Dr. Charyl Dancer  . CESAREAN SECTION  10/29/2010   Procedure: CESAREAN SECTION;  Surgeon: Florian Buff, MD;  Location: [facility];  Service: Gynecology;;  . [place]    . COCHLEAR IMPLANT    . COCHLEAR IMPLANT    . cystoscopy, hydrodilation under anesthesia  04/04/2011    Current Outpatient Medications on File Prior to Visit  Medication Sig Dispense  Refill  . docusate sodium (COLACE) 100 MG capsule Take 1 capsule (100 mg total) by mouth 2 (two) times daily. (Patient not taking: Reported on 04/09/2019) 10 capsule 0  . Iron-FA-B Cmp-C-Biot-Probiotic (FUSION PLUS) CAPS Take 1 tablet by mouth daily. (Patient not taking: Reported on 04/09/2019) 30 capsule 6  . oxyCODONE (OXY IR/ROXICODONE) 5 MG immediate release tablet Take 1 tablet (5 mg total) by mouth every 4 (four) hours as needed for moderate pain. (Patient not taking: Reported on 04/09/2019) 30 tablet 0  . Prenatal Vit-Fe Fumarate-FA (PRENATAL VITAMINS) 28-0.8 MG TABS Take 1 tablet by mouth daily. (Patient not taking: Reported on 04/09/2019) 30 tablet 4  . witch hazel-glycerin (TUCKS) pad Apply 1 application topically as needed for hemorrhoids. (Patient not taking: Reported on 04/09/2019) 40 each 12   No current facility-administered medications on file prior to visit.    Allergies  Allergen Reactions  . Latex Rash  . Nsaids Rash  . Tylenol [Acetaminophen] Itching and Rash    Social History   Socioeconomic History  . Marital status: Married    Spouse name: Gwyneth Sprout  . Number of children: Not on file  . Years of education: Not on file  . Highest education level: Not on file  Occupational History  .  Not on file  Tobacco Use  . Smoking status: Former Smoker    Packs/day: 1.00    Years: 8.00    Pack years: 8.00    Types: Cigarettes    Quit date: 01/02/2018    Years since quitting: 1.2  . Smokeless tobacco: Never Used  Substance and Sexual Activity  . Alcohol use: Not Currently    Alcohol/week: 0.0 standard drinks  . Drug use: No  . Sexual activity: Yes    Partners: Male    Birth control/protection: None    Comment: Nexplanon  Other Topics Concern  . Not on file  Social History Narrative  . Not on file   Social Determinants of Health   Financial Resource Strain: Low Risk   . Difficulty of Paying Living Expenses: Not hard at all  Food Insecurity: No Food Insecurity   . Worried About Programme researcher, broadcasting/film/video in the Last Year: Never true  . Ran Out of Food in the Last Year: Never true  Transportation Needs: No Transportation Needs  . Lack of Transportation (Medical): No  . Lack of Transportation (Non-Medical): No  Physical Activity: Inactive  . Days of Exercise per Week: 0 days  . Minutes of Exercise per Session: 0 min  Stress: No Stress Concern Present  . Feeling of Stress : Not at all  Social Connections: Unknown  . Frequency of Communication with Friends and Family: More than three times a week  . Frequency of Social Gatherings with Friends and Family: More than three times a week  . Attends Religious Services: Never  . Active Member of Clubs or Organizations: No  . Attends Banker Meetings: Never  . Marital Status: Not on file  Intimate Partner Violence: Not At Risk  . Fear of Current or Ex-Partner: No  . Emotionally Abused: No  . Physically Abused: No  . Sexually Abused: No    Family History  Problem Relation Age of Onset  . Hypertension Father   . Stroke Father   . Hypertension Mother   . Lung cancer Maternal Grandfather   . Syncope episode Paternal Grandmother     The following portions of the patient's history were reviewed and updated as appropriate: allergies, current medications, past OB history, past medical history, past surgical history, past family history, past social history, and problem list.    OBJECTIVE: Initial Physical Exam (New OB)  GENERAL APPEARANCE: alert, well appearing, in no apparent distress, oriented to person, place and time HEAD: normocephalic, atraumatic MOUTH: mucous membranes moist, pharynx normal without lesions THYROID: no thyromegaly or masses present BREASTS: no masses noted, no significant tenderness, no palpable axillary nodes, no skin changes LUNGS: clear to auscultation, no wheezes, rales or rhonchi, symmetric air entry HEART: regular rate and rhythm, no murmurs ABDOMEN: soft,  nontender, nondistended, no abnormal masses, no epigastric pain EXTREMITIES: no redness or tenderness in the calves or thighs, no edema, no limitation in range of motion, intact peripheral pulses SKIN: normal coloration and turgor, no rashes LYMPH NODES: no adenopathy palpable NEUROLOGIC: alert, oriented, normal speech, no focal findings or movement disorder noted  PELVIC EXAM EXTERNAL GENITALIA: normal appearing vulva with no masses, tenderness or lesions VAGINA: no abnormal discharge or lesions CERVIX: no lesions or cervical motion tenderness UTERUS: gravid ADNEXA: no masses palpable and nontender OB EXAM PELVIMETRY: appears adequate RECTUM: exam not indicated  Patient Name: Tammy Snyder DOB: March 21, 1990 MRN: [id number] ULTRASOUND REPORT  Location: [facility] Date of Service: 04/09/2019   Indications:dating Findings:  Singleton intrauterine pregnancy is visualized with a CRL consistent with [redacted]w[redacted]d gestation, giving an (U/S) EDD of 10/10/2019.   FHR: 139 BPM CRL measurement: 76.2 mm Yolk sac is not visualized and early anatomy is normal. Amnion: not visualized   Right Ovary is normal in appearance. Left Ovary is normal appearance. Corpus luteal cyst:  is not visualized Survey of the adnexa demonstrates no adnexal masses. There is no free peritoneal fluid in the cul de sac.  Impression: 1. [redacted]w[redacted]d Viable Singleton Intrauterine pregnancy by U/S. 2. (U/S) EDD is 10/10/2019.  Recommendations: 1.Clinical correlation with the patient's History and Physical Exam.  Jenine M. Marciano Sequin     RDMS  ASSESSMENT: Normal pregnancy  PLAN: New OB counseling: The patient has been given an overview regarding routine prenatal care. Recommendations regarding diet, weight gain, and exercise in pregnancy were given. Prenatal testing, optional genetic testing, carrier screening test, and ultrasound use in pregnancy were reviewed. Panorama collected today. Benefits of Breast Feeding were  discussed. The patient is encouraged to consider nursing her baby post partum.  Doreene Burke, CNM

## 2019-04-09 NOTE — Patient Instructions (Signed)

## 2019-04-10 ENCOUNTER — Other Ambulatory Visit: Payer: Medicaid Other

## 2019-04-10 LAB — CBC WITH DIFFERENTIAL
Basophils Absolute: 0 10*3/uL (ref 0.0–0.2)
Basos: 0 %
EOS (ABSOLUTE): 0.1 10*3/uL (ref 0.0–0.4)
Eos: 1 %
Hematocrit: 35 % (ref 34.0–46.6)
Hemoglobin: 11.7 g/dL (ref 11.1–15.9)
Immature Grans (Abs): 0 10*3/uL (ref 0.0–0.1)
Immature Granulocytes: 0 %
Lymphocytes Absolute: 2 10*3/uL (ref 0.7–3.1)
Lymphs: 26 %
MCH: 31.7 pg (ref 26.6–33.0)
MCHC: 33.4 g/dL (ref 31.5–35.7)
MCV: 95 fL (ref 79–97)
Monocytes Absolute: 0.6 10*3/uL (ref 0.1–0.9)
Monocytes: 7 %
Neutrophils Absolute: 5.1 10*3/uL (ref 1.4–7.0)
Neutrophils: 66 %
RBC: 3.69 x10E6/uL — ABNORMAL LOW (ref 3.77–5.28)
RDW: 12.9 % (ref 11.7–15.4)
WBC: 7.8 10*3/uL (ref 3.4–10.8)

## 2019-04-10 LAB — HEPATITIS B SURFACE ANTIGEN: Hepatitis B Surface Ag: NEGATIVE

## 2019-04-10 LAB — URINALYSIS, ROUTINE W REFLEX MICROSCOPIC
Bilirubin, UA: NEGATIVE
Glucose, UA: NEGATIVE
Ketones, UA: NEGATIVE
Leukocytes,UA: NEGATIVE
Nitrite, UA: NEGATIVE
Protein,UA: NEGATIVE
RBC, UA: NEGATIVE
Specific Gravity, UA: 1.023 (ref 1.005–1.030)
Urobilinogen, Ur: 0.2 mg/dL (ref 0.2–1.0)
pH, UA: 6 (ref 5.0–7.5)

## 2019-04-10 LAB — "ABO AND RH ": Rh Factor: POSITIVE

## 2019-04-10 LAB — SYPHILIS: RPR W/REFLEX TO RPR TITER AND TREPONEMAL ANTIBODIES, TRADITIONAL SCREENING AND DIAGNOSIS ALGORITHM: RPR Ser Ql: NONREACTIVE

## 2019-04-10 LAB — RUBELLA SCREEN: Rubella Antibodies, IGG: 1.62 {index}

## 2019-04-10 LAB — ANTIBODY SCREEN: Antibody Screen: NEGATIVE

## 2019-04-10 LAB — VARICELLA ZOSTER ANTIBODY, IGG: Varicella zoster IgG: 774 index (ref >165)

## 2019-04-11 LAB — URINE CULTURE

## 2019-04-11 LAB — GC/CHLAMYDIA PROBE AMP
Chlamydia trachomatis, NAA: NEGATIVE
Neisseria Gonorrhoeae by PCR: NEGATIVE

## 2019-04-15 ENCOUNTER — Telehealth: Payer: Self-pay

## 2019-04-15 ENCOUNTER — Other Ambulatory Visit: Payer: Self-pay | Admitting: Certified Nurse Midwife

## 2019-04-15 MED ORDER — NITROFURANTOIN MONOHYD MACRO 100 MG PO CAPS: 100.0000 mg | ORAL_CAPSULE | Freq: Two times a day (BID) | ORAL | 0 refills | Status: AC

## 2019-04-15 NOTE — Progress Notes (Signed)
Urine positive for UTI, orders placed for treatment.   Temiloluwa Laredo, CNM  

## 2019-04-15 NOTE — Telephone Encounter (Signed)
Patient informed of positive urine culture. She states she will go pick up the medicine and start taking it. Expressed importance of taking all medication patient expressed understanding.

## 2019-04-15 NOTE — Telephone Encounter (Signed)
Voicemail left for patient please return call.

## 2019-04-23 ENCOUNTER — Telehealth: Payer: Self-pay

## 2019-04-23 ENCOUNTER — Telehealth: Payer: Self-pay | Admitting: Certified Nurse Midwife

## 2019-04-23 NOTE — Telephone Encounter (Signed)
Pt was returning the nurses call. I called back and the nurse line was busy. I informed the pt that the nurse would be in touch with her. Please advise

## 2019-04-23 NOTE — Telephone Encounter (Signed)
Voicemail message sent to patient- results of genetic screening.

## 2019-04-23 NOTE — Telephone Encounter (Signed)
Returned patients call back to me- informed her of low risk genetic test. Patient requested to be told verbally the gender. She is having a healthy baby girl.

## 2019-04-28 ENCOUNTER — Emergency Department: Payer: Medicaid Other

## 2019-04-28 ENCOUNTER — Other Ambulatory Visit: Payer: Self-pay

## 2019-04-28 ENCOUNTER — Inpatient Hospital Stay
Admission: EM | Admit: 2019-04-28 | Discharge: 2019-04-30 | DRG: 833 | Disposition: A | Discharge status: Home / Self Care | Payer: Medicaid Other | Attending: Obstetrics and Gynecology | Admitting: Obstetrics and Gynecology

## 2019-04-28 DIAGNOSIS — O34219 Maternal care for unspecified type scar from previous cesarean delivery: Secondary | ICD-10-CM | POA: Diagnosis present

## 2019-04-28 DIAGNOSIS — Z20822 Contact with and (suspected) exposure to covid-19: Secondary | ICD-10-CM | POA: Diagnosis present

## 2019-04-28 DIAGNOSIS — N12 Tubulo-interstitial nephritis, not specified as acute or chronic: Secondary | ICD-10-CM

## 2019-04-28 DIAGNOSIS — Z87891 Personal history of nicotine dependence: Secondary | ICD-10-CM

## 2019-04-28 DIAGNOSIS — Z3A16 16 weeks gestation of pregnancy: Secondary | ICD-10-CM

## 2019-04-28 DIAGNOSIS — N10 Acute tubulo-interstitial nephritis: Secondary | ICD-10-CM | POA: Diagnosis present

## 2019-04-28 DIAGNOSIS — O2302 Infections of kidney in pregnancy, second trimester: Principal | ICD-10-CM | POA: Diagnosis present

## 2019-04-28 LAB — CBC WITH DIFFERENTIAL/PLATELET
Abs Immature Granulocytes: 0.07 10*3/uL (ref 0.00–0.07)
Basophils Absolute: 0 10*3/uL (ref 0.0–0.1)
Basophils Relative: 0 %
Eosinophils Absolute: 0.1 10*3/uL (ref 0.0–0.5)
Eosinophils Relative: 0 %
HCT: 33.1 % — ABNORMAL LOW (ref 36.0–46.0)
Hemoglobin: 11.5 g/dL — ABNORMAL LOW (ref 12.0–15.0)
Immature Granulocytes: 1 %
Lymphocytes Relative: 16 %
Lymphs Abs: 2.1 10*3/uL (ref 0.7–4.0)
MCH: 33 pg (ref 26.0–34.0)
MCHC: 34.7 g/dL (ref 30.0–36.0)
MCV: 95.1 fL (ref 80.0–100.0)
Monocytes Absolute: 0.6 10*3/uL (ref 0.1–1.0)
Monocytes Relative: 5 %
Neutro Abs: 10.1 10*3/uL — ABNORMAL HIGH (ref 1.7–7.7)
Neutrophils Relative %: 78 %
Platelets: 170 10*3/uL (ref 150–400)
RBC: 3.48 MIL/uL — ABNORMAL LOW (ref 3.87–5.11)
RDW: 13.2 % (ref 11.5–15.5)
WBC: 12.9 10*3/uL — ABNORMAL HIGH (ref 4.0–10.5)
nRBC: 0 % (ref 0.0–0.2)

## 2019-04-28 LAB — URINALYSIS, COMPLETE (UACMP) WITH MICROSCOPIC
Bacteria, UA: NONE SEEN
Bilirubin Urine: NEGATIVE
Glucose, UA: NEGATIVE mg/dL
Ketones, ur: NEGATIVE mg/dL
Nitrite: NEGATIVE
Protein, ur: 100 mg/dL — AB
Specific Gravity, Urine: 1.018 (ref 1.005–1.030)
WBC, UA: >50 WBC/hpf — ABNORMAL HIGH (ref 0–5)
pH: 6 (ref 5.0–8.0)

## 2019-04-28 LAB — PROTIME-INR
INR: 1 (ref 0.8–1.2)
Prothrombin Time: 12.6 seconds (ref 11.4–15.2)

## 2019-04-28 LAB — COMPREHENSIVE METABOLIC PANEL
ALT: 22 U/L (ref 0–44)
AST: 20 U/L (ref 15–41)
Albumin: 3.4 g/dL — ABNORMAL LOW (ref 3.5–5.0)
Alkaline Phosphatase: 123 U/L (ref 38–126)
Anion gap: 9 (ref 5–15)
BUN: 7 mg/dL (ref 6–20)
CO2: 21 mmol/L — ABNORMAL LOW (ref 22–32)
Calcium: 9.1 mg/dL (ref 8.9–10.3)
Chloride: 107 mmol/L (ref 98–111)
Creatinine, Ser: 0.33 mg/dL — ABNORMAL LOW (ref 0.44–1.00)
GFR calc Af Amer: >60 mL/min (ref >60)
GFR calc non Af Amer: >60 mL/min (ref >60)
Glucose, Bld: 124 mg/dL — ABNORMAL HIGH (ref 70–99)
Potassium: 3 mmol/L — ABNORMAL LOW (ref 3.5–5.1)
Sodium: 137 mmol/L (ref 135–145)
Total Bilirubin: 0.4 mg/dL (ref 0.3–1.2)
Total Protein: 6.7 g/dL (ref 6.5–8.1)

## 2019-04-28 LAB — RESPIRATORY PANEL BY RT PCR (FLU A&B, COVID)
Influenza A by PCR: NEGATIVE
Influenza B by PCR: NEGATIVE
SARS Coronavirus 2 by RT PCR: NEGATIVE

## 2019-04-28 LAB — LACTIC ACID, PLASMA: Lactic Acid, Venous: 1.4 mmol/L (ref 0.5–1.9)

## 2019-04-28 MED ORDER — PRENATAL MULTIVITAMIN CH
1.0000 | ORAL_TABLET | Freq: Every day | ORAL | Status: DC
  Administered 2019-04-29: 1 via ORAL
  Filled 2019-04-28 (×2): qty 1

## 2019-04-28 MED ORDER — CEFTRIAXONE SODIUM 500 MG IJ SOLR: 1.0000 g | Freq: Every day | INTRAMUSCULAR | Status: DC

## 2019-04-28 MED ORDER — KCL-LACTATED RINGERS-D5W 20 MEQ/L IV SOLN
INTRAVENOUS | Status: DC
  Filled 2019-04-28 (×11): qty 1000

## 2019-04-28 MED ORDER — LACTATED RINGERS IV BOLUS
1000.0000 mL | Freq: Once | INTRAVENOUS | Status: AC
  Administered 2019-04-28: 1000 mL via INTRAVENOUS

## 2019-04-28 MED ORDER — SODIUM CHLORIDE 0.9 % IV SOLN
1.0000 g | Freq: Once | INTRAVENOUS | Status: AC
  Administered 2019-04-28: 1 g via INTRAVENOUS
  Filled 2019-04-28: qty 10

## 2019-04-28 MED ORDER — OXYCODONE HCL 5 MG PO TABS
10.0000 mg | ORAL_TABLET | ORAL | Status: DC | PRN
  Administered 2019-04-29 – 2019-04-30 (×6): 10 mg via ORAL
  Filled 2019-04-28 (×6): qty 2

## 2019-04-28 MED ORDER — POTASSIUM CHLORIDE CRYS ER 20 MEQ PO TBCR
40.0000 meq | EXTENDED_RELEASE_TABLET | Freq: Once | ORAL | Status: AC
  Administered 2019-04-28: 40 meq via ORAL
  Filled 2019-04-28: qty 2

## 2019-04-28 MED ORDER — MORPHINE SULFATE (PF) 4 MG/ML IV SOLN
4.0000 mg | Freq: Once | INTRAVENOUS | Status: AC
  Administered 2019-04-28: 4 mg via INTRAVENOUS
  Filled 2019-04-28: qty 1

## 2019-04-28 MED ORDER — CALCIUM CARBONATE ANTACID 500 MG PO CHEW: 2.0000 | CHEWABLE_TABLET | ORAL | Status: DC | PRN

## 2019-04-28 NOTE — ED Notes (Signed)
Pt states having bad lower back pain that radiates to her vagina when moving. Pt states pain is 10/10.

## 2019-04-28 NOTE — ED Provider Notes (Signed)
[facility] Emergency Department Provider Note   ____________________________________________   First MD Initiated Contact with Patient 04/28/19 2223     (approximate)  I have reviewed the triage vital signs and the nursing notes.   HISTORY  Chief Complaint Flank Pain and Recurrent UTI    HPI Tammy Snyder is a 29 y.o. female G4 P3-0-0-3 at approximately 16 weeks of pregnancy who presents to the ED complaining of flank pain.  Patient reports that she initially developed dysuria and lower abdominal pain about 2 weeks ago.  She was initially started on Macrobid by her midwife and completed a 7-day course a few days prior.  She states that the pain has worsened since then and seems to have tracked into both sides of her mid back.  She denies any fevers, but has been feeling nauseous with no vomiting.  She denies any vaginal bleeding or discharge.  She had an unremarkable ultrasound earlier this pregnancy.  When she described her symptoms to her OB/GYN office, they recommended she be seen in the ED for further evaluation.        Past Medical History:  Diagnosis Date  . Bipolar 1 disorder ([facility])   . Borderline personality disorder ([facility])   . Cochlear implant in place   . Hearing loss   . Interstitial cystitis   . Mental disorder    Borderline personalities disorder  . Oliguria 2013   cystoscopy, hydrodilation under anesthesia  . Polysubstance abuse ([facility])   . Vaginal Pap smear, abnormal     Patient Active Problem List   Diagnosis Date Noted  . Acute pyelonephritis 04/28/2019  . Labor and delivery, indication for care 10/05/2018  . Vaginal birth after cesarean 10/05/2018  . History of hypokalemia 09/13/2018  . Pregnancy 08/03/2018  . Palpitations 07/04/2018  . Orthostatic lightheadedness 07/04/2018  . Syncope and collapse 07/03/2018  . History of chlamydia 07/02/2018  . Desires VBAC (vaginal birth after cesarean) trial 07/02/2018  . Hearing loss  03/06/2018  . Previous cesarean section 03/06/2018  . History of VBAC 03/06/2018    Past Surgical History:  Procedure Laterality Date  . Calibration of urethra with dilation of the urethra under anesthesia  04/04/2011   Dr. Rosezetta Schlatter  . CESAREAN SECTION  10/29/2010   Procedure: CESAREAN SECTION;  Surgeon: Lazaro Arms, MD;  Location: [facility];  Service: Gynecology;;  . CESAREAN SECTION    . COCHLEAR IMPLANT    . COCHLEAR IMPLANT    . cystoscopy, hydrodilation under anesthesia  04/04/2011    Prior to Admission medications   Medication Sig Start Date End Date Taking? Authorizing Provider  Prenat w/o A-FeCbGl-DSS-FA-DHA (CITRANATAL DHA PO) Take 1 tablet by mouth daily.   Yes [provider]  docusate sodium (COLACE) 100 MG capsule Take 1 capsule (100 mg total) by mouth 2 (two) times daily. Patient not taking: Reported on 04/09/2019 10/07/18   Gunnar Bulla, CNM  Iron-FA-B Cmp-C-Biot-Probiotic (FUSION PLUS) CAPS Take 1 tablet by mouth daily. Patient not taking: Reported on 04/09/2019 08/01/18   Doreene Burke, CNM  nitrofurantoin, macrocrystal-monohydrate, (MACROBID) 100 MG capsule Take 100 mg by mouth 2 (two) times daily.    [provider]  Prenatal Vit-Fe Fumarate-FA (PRENATAL VITAMINS) 28-0.8 MG TABS Take 1 tablet by mouth daily. Patient not taking: Reported on 04/28/2019 08/07/18   Purcell Nails, CNM  witch hazel-glycerin (TUCKS) pad Apply 1 application topically as needed for hemorrhoids. Patient not taking: Reported on 04/09/2019 10/07/18   Jeralyn Bennett,  Lara Mulch, CNM    Allergies Latex, Nsaids, and Tylenol [acetaminophen]  Family History  Problem Relation Age of Onset  . Hypertension Father   . Stroke Father   . Hypertension Mother   . Lung cancer Maternal Grandfather   . Syncope episode Paternal Grandmother     Social History Social History   Tobacco Use  . Smoking status: Former Smoker    Packs/day: 1.00    Years: 8.00    Pack  years: 8.00    Types: Cigarettes    Quit date: 01/02/2018    Years since quitting: 1.3  . Smokeless tobacco: Never Used  Substance Use Topics  . Alcohol use: Not Currently    Alcohol/week: 0.0 standard drinks  . Drug use: No    Review of Systems  Constitutional: No fever/chills Eyes: No visual changes. ENT: No sore throat. Cardiovascular: Denies chest pain. Respiratory: Denies shortness of breath. Gastrointestinal: No abdominal pain.  Positive for flank pain.  Positive for nausea, no vomiting.  No diarrhea.  No constipation. Genitourinary: Negative for dysuria. Musculoskeletal: Negative for back pain. Skin: Negative for rash. Neurological: Negative for headaches, focal weakness or numbness.  ____________________________________________   PHYSICAL EXAM:  VITAL SIGNS: ED Triage Vitals  Enc Vitals Group     BP 04/28/19 2011 (!) 99/50     Pulse Rate 04/28/19 2011 (!) 104     Resp 04/28/19 2011 (!) 22     Temp 04/28/19 2011 98.7 F (37.1 C)     Temp Source 04/28/19 2011 Oral     SpO2 04/28/19 2011 99 %     Weight 04/28/19 2014 143 lb 4 oz (65 kg)     Height 04/28/19 2014 5' 0.5" (1.537 m)     Head Circumference --      Peak Flow --      Pain Score 04/28/19 2013 10     Pain Loc --      Pain Edu? --      Excl. in [place]? --     Constitutional: Alert and oriented. Eyes: Conjunctivae are normal. Head: Atraumatic. Nose: No congestion/rhinnorhea. Mouth/Throat: Mucous membranes are moist. Neck: Normal ROM Cardiovascular: Normal rate, regular rhythm. Grossly normal heart sounds. Respiratory: Normal respiratory effort.  No retractions. Lungs CTAB. Gastrointestinal: Soft and tender to palpation in the suprapubic area with bilateral CVA tenderness. No distention. Genitourinary: deferred Musculoskeletal: No lower extremity tenderness nor edema. Neurologic:  Normal speech and language. No gross focal neurologic deficits are appreciated. Skin:  Skin is warm, dry and intact. No  rash noted. Psychiatric: Mood and affect are normal. Speech and behavior are normal.  ____________________________________________   LABS (all labs ordered are listed, but only abnormal results are displayed)  Labs Reviewed  COMPREHENSIVE METABOLIC PANEL - Abnormal; Notable for the following components:      Result Value   Potassium 3.0 (*)    CO2 21 (*)    Glucose, Bld 124 (*)    Creatinine, Ser 0.33 (*)    Albumin 3.4 (*)    All other components within normal limits  CBC WITH DIFFERENTIAL/PLATELET - Abnormal; Notable for the following components:   WBC 12.9 (*)    RBC 3.48 (*)    Hemoglobin 11.5 (*)    HCT 33.1 (*)    Neutro Abs 10.1 (*)    All other components within normal limits  URINALYSIS, COMPLETE (UACMP) WITH MICROSCOPIC - Abnormal; Notable for the following components:   Color, Urine YELLOW (*)    APPearance CLOUDY (*)  Hgb urine dipstick SMALL (*)    Protein, ur 100 (*)    Leukocytes,Ua MODERATE (*)    WBC, UA >50 (*)    All other components within normal limits  RESPIRATORY PANEL BY RT PCR (FLU A&B, COVID)  CULTURE, BLOOD (ROUTINE X 2)  CULTURE, BLOOD (ROUTINE X 2)  URINE CULTURE  LACTIC ACID, PLASMA  PROTIME-INR    PROCEDURES  Procedure(s) performed (including Critical Care):  Procedures   ____________________________________________   INITIAL IMPRESSION / ASSESSMENT AND PLAN / ED COURSE       29 year old female at approximately 16 weeks of pregnancy presents to the ED complaining of bilateral flank pain and nausea after recently completing treatment for UTI.  She does not appear systemically ill on my evaluation, but does have significant pain in both flanks with CVA tenderness.  Urine is concerning for UTI but vital signs are not consistent with sepsis.  Prior urine culture was contaminated, we will give dose of Rocephin here in the ED.  Bilateral renal ultrasound shows no hydronephrosis to suggest obstructive process.  Given patient's level of  pain, I do feel that she would benefit from admission for treatment with IV antibiotics.  Case discussed with Dr. Logan Bores of [facility], who accepts patient for admission.      ____________________________________________   FINAL CLINICAL IMPRESSION(S) / ED DIAGNOSES  Final diagnoses:  Pyelonephritis  [redacted] weeks gestation of pregnancy     ED Discharge Orders    None       Note:  This document was prepared using Dragon voice recognition software and may include unintentional dictation errors.   Chesley Noon, MD 04/29/19 [phone]

## 2019-04-28 NOTE — ED Triage Notes (Signed)
Patient arrived via POV from home with husband. Patient is AOx4 and ambulatory, [redacted] weeks pregnant. Patient chief complaint is flank pain on right and left side as well as sharp pain in suprapubic area. Patient is currently on antibiotic. Pain began suddenly today around 1500. Patient states it is extremely painful to urinate. Patient OBGYN instructed patient to go to ED ASAP due to possible pyelonephritis.

## 2019-04-29 ENCOUNTER — Encounter: Payer: Self-pay | Admitting: Obstetrics and Gynecology

## 2019-04-29 DIAGNOSIS — O2302 Infections of kidney in pregnancy, second trimester: Principal | ICD-10-CM

## 2019-04-29 DIAGNOSIS — Z3A16 16 weeks gestation of pregnancy: Secondary | ICD-10-CM | POA: Diagnosis not present

## 2019-04-29 DIAGNOSIS — O34219 Maternal care for unspecified type scar from previous cesarean delivery: Secondary | ICD-10-CM | POA: Diagnosis present

## 2019-04-29 DIAGNOSIS — Z87891 Personal history of nicotine dependence: Secondary | ICD-10-CM | POA: Diagnosis not present

## 2019-04-29 DIAGNOSIS — Z20822 Contact with and (suspected) exposure to covid-19: Secondary | ICD-10-CM | POA: Diagnosis present

## 2019-04-29 DIAGNOSIS — N10 Acute tubulo-interstitial nephritis: Secondary | ICD-10-CM | POA: Diagnosis not present

## 2019-04-29 DIAGNOSIS — M545 Low back pain: Secondary | ICD-10-CM | POA: Diagnosis present

## 2019-04-29 MED ORDER — SODIUM CHLORIDE 0.9 % IV SOLN
1.0000 g | INTRAVENOUS | Status: DC
  Administered 2019-04-29: 1 g via INTRAVENOUS
  Filled 2019-04-29: qty 1
  Filled 2019-04-29: qty 10

## 2019-04-29 NOTE — Plan of Care (Signed)

## 2019-04-29 NOTE — Progress Notes (Signed)
[place] COMPREHENSIVE PROGRESS NOTE  Tammy Snyder is a 29 y.o. G4P3003 at [redacted]w[redacted]d who is admitted for Pyelonephritis.  Estimated Date of Delivery: 10/10/19 Fetal presentation is unsure.  Length of Stay:  0 Days. Admitted 04/28/2019  Subjective: Feeling a little better, less painful to urinate   She reports no uterine contractions, no bleeding and no loss of fluid per vagina.  Vitals:  Blood pressure 102/62, pulse 84, temperature 98.4 F (36.9 C), temperature source Oral, resp. rate 18, height 5' 0.5" (1.537 m), weight 65 kg, SpO2 100 %, unknown if currently breastfeeding. Physical Examination: CONSTITUTIONAL: Well-developed, well-nourished female in no acute distress.  HENT:  Normocephalic, atraumatic, External right and left ear normal. Oropharynx is clear and moist EYES: Conjunctivae and EOM are normal. Pupils are equal, round, and reactive to light. No scleral icterus.  NECK: Normal range of motion, supple, no masses SKIN: Skin is warm and dry. No rash noted. Not diaphoretic. No erythema. No pallor. [place]: Alert and oriented to person, place, and time. Normal reflexes, muscle tone coordination. No cranial nerve deficit noted. PSYCHIATRIC: Normal mood and affect. Normal behavior. Normal judgment and thought content. CARDIOVASCULAR: Normal heart rate noted, regular rhythm RESPIRATORY: Effort and breath sounds normal, no problems with respiration noted MUSCULOSKELETAL: Normal range of motion. No edema and no tenderness. 2+ distal pulses. ABDOMEN: Soft, nontender, nondistended, gravid. Positive CVA tenderness on right side CERVIX:  Deferred   Fetal monitoring: FHT- not done yet this morning Uterine activity: abdomen soft  Results for orders placed or performed during the hospital encounter of 04/28/19 (from the past 48 hour(s))  Comprehensive metabolic panel     Status: Abnormal   Collection Time: 04/28/19  8:24 PM  Result Value Ref Range   Sodium 137 135 - 145  mmol/L   Potassium 3.0 (L) 3.5 - 5.1 mmol/L   Chloride 107 98 - 111 mmol/L   CO2 21 (L) 22 - 32 mmol/L   Glucose, Bld 124 (H) 70 - 99 mg/dL    Comment: Glucose reference range applies only to samples taken after fasting for at least 8 hours.   BUN 7 6 - 20 mg/dL   Creatinine, Ser 0.33 (L) 0.44 - 1.00 mg/dL   Calcium 9.1 8.9 - 10.3 mg/dL   Total Protein 6.7 6.5 - 8.1 g/dL   Albumin 3.4 (L) 3.5 - 5.0 g/dL   AST 20 15 - 41 U/L   ALT 22 0 - 44 U/L   Alkaline Phosphatase 123 38 - 126 U/L   Total Bilirubin 0.4 0.3 - 1.2 mg/dL   GFR calc non Af Amer >60 >60 mL/min   GFR calc Af Amer >60 >60 mL/min   Anion gap 9 5 - 15    Comment: Performed at [facility], [address] [place], [place] [address]  Lactic acid, plasma     Status: None   Collection Time: 04/28/19  8:24 PM  Result Value Ref Range   Lactic Acid, Venous 1.4 0.5 - 1.9 mmol/L    Comment: Performed at [facility], [address] [place], [place] [address]  CBC with Differential     Status: Abnormal   Collection Time: 04/28/19  8:24 PM  Result Value Ref Range   WBC 12.9 (H) 4.0 - 10.5 K/uL   RBC 3.48 (L) 3.87 - 5.11 MIL/uL   Hemoglobin 11.5 (L) 12.0 - 15.0 g/dL   HCT 33.1 (L) 36.0 - 46.0 %   MCV 95.1 80.0 - 100.0 fL  MCH 33.0 26.0 - 34.0 pg   MCHC 34.7 30.0 - 36.0 g/dL   RDW 16.1 09.6 - 04.5 %   Platelets 170 150 - 400 K/uL   nRBC 0.0 0.0 - 0.2 %   Neutrophils Relative % 78 %   Neutro Abs 10.1 (H) 1.7 - 7.7 K/uL   Lymphocytes Relative 16 %   Lymphs Abs 2.1 0.7 - 4.0 K/uL   Monocytes Relative 5 %   Monocytes Absolute 0.6 0.1 - 1.0 K/uL   Eosinophils Relative 0 %   Eosinophils Absolute 0.1 0.0 - 0.5 K/uL   Basophils Relative 0 %   Basophils Absolute 0.0 0.0 - 0.1 K/uL   Immature Granulocytes 1 %   Abs Immature Granulocytes 0.07 0.00 - 0.07 K/uL    Comment: Performed at [facility], [address] [place], [place] [address]  Protime-INR     Status: None   Collection Time: 04/28/19   8:24 PM  Result Value Ref Range   Prothrombin Time 12.6 11.4 - 15.2 seconds   INR 1.0 0.8 - 1.2    Comment: (NOTE) INR goal varies based on device and disease states. Performed at [facility], [address] [place], [place] [address]   Culture, blood (Routine x 2)     Status: None (Preliminary result)   Collection Time: 04/28/19  8:24 PM   Specimen: BLOOD  Result Value Ref Range   Specimen Description BLOOD RIGHT AC    Special Requests      BOTTLES DRAWN AEROBIC AND ANAEROBIC Blood Culture results may not be optimal due to an excessive volume of blood received in culture bottles   Culture      NO GROWTH < 12 HOURS Performed at [facility], [address] [place], [place] [address]    Report Status PENDING   Culture, blood (Routine x 2)     Status: None (Preliminary result)   Collection Time: 04/28/19  8:24 PM   Specimen: BLOOD  Result Value Ref Range   Specimen Description BLOOD LEFT AC    Special Requests      BOTTLES DRAWN AEROBIC AND ANAEROBIC Blood Culture results may not be optimal due to an excessive volume of blood received in culture bottles   Culture      NO GROWTH < 12 HOURS Performed at [facility], [address] [place], [place] [address]    Report Status PENDING   Urinalysis, Complete w Microscopic     Status: Abnormal   Collection Time: 04/28/19  8:24 PM  Result Value Ref Range   Color, Urine YELLOW (A) YELLOW   APPearance CLOUDY (A) CLEAR   Specific Gravity, Urine 1.018 1.005 - 1.030   pH 6.0 5.0 - 8.0   Glucose, UA NEGATIVE NEGATIVE mg/dL   Hgb urine dipstick SMALL (A) NEGATIVE   Bilirubin Urine NEGATIVE NEGATIVE   Ketones, ur NEGATIVE NEGATIVE mg/dL   Protein, ur 657 (A) NEGATIVE mg/dL   Nitrite NEGATIVE NEGATIVE   Leukocytes,Ua MODERATE (A) NEGATIVE   RBC / HPF 11-20 0 - 5 RBC/hpf   WBC, UA >50 (H) 0 - 5 WBC/hpf   Bacteria, UA NONE SEEN NONE SEEN   Squamous Epithelial / LPF 6-10 0 - 5   Mucus PRESENT     Hyaline Casts, UA PRESENT    Amorphous Crystal PRESENT     Comment: Performed at [facility], [address], [place], [place] [address]  Respiratory Panel by RT PCR (Flu A&B, Covid) -  Nasopharyngeal Swab     Status: None   Collection Time: 04/28/19 10:42 PM   Specimen: Nasopharyngeal Swab  Result Value Ref Range   SARS Coronavirus 2 by RT PCR NEGATIVE NEGATIVE    Comment: (NOTE) SARS-CoV-2 target nucleic acids are NOT DETECTED. The SARS-CoV-2 RNA is generally detectable in upper respiratoy specimens during the acute phase of infection. The lowest concentration of SARS-CoV-2 viral copies this assay can detect is 131 copies/mL. A negative result does not preclude SARS-Cov-2 infection and should not be used as the sole basis for treatment or other patient management decisions. A negative result may occur with  improper specimen collection/handling, submission of specimen other than nasopharyngeal swab, presence of viral mutation(s) within the areas targeted by this assay, and inadequate number of viral copies (<131 copies/mL). A negative result must be combined with clinical observations, patient history, and epidemiological information. The expected result is Negative. Fact Sheet for Patients:  https://www.moore.com/ Fact Sheet for Healthcare Providers:  https://www.young.biz/ This test is not yet ap proved or cleared by the [place] FDA and  has been authorized for detection and/or diagnosis of SARS-CoV-2 by FDA under an Emergency Use Authorization (EUA). This EUA will remain  in effect (meaning this test can be used) for the duration of the COVID-19 declaration under Section 564(b)(1) of the Act, 21 U.S.C. section 360bbb-3(b)(1), unless the authorization is terminated or revoked sooner.    Influenza A by PCR NEGATIVE NEGATIVE   Influenza B by PCR NEGATIVE NEGATIVE    Comment: (NOTE) The Xpert Xpress SARS-CoV-2/FLU/RSV assay  is intended as an aid in  the diagnosis of influenza from Nasopharyngeal swab specimens and  should not be used as a sole basis for treatment. Nasal washings and  aspirates are unacceptable for Xpert Xpress SARS-CoV-2/FLU/RSV  testing. Fact Sheet for Patients: https://www.moore.com/ Fact Sheet for Healthcare Providers: https://www.young.biz/ This test is not yet approved or cleared by the [place] FDA and  has been authorized for detection and/or diagnosis of SARS-CoV-2 by  FDA under an Emergency Use Authorization (EUA). This EUA will remain  in effect (meaning this test can be used) for the duration of the  Covid-19 declaration under Section 564(b)(1) of the Act, 21  U.S.C. section 360bbb-3(b)(1), unless the authorization is  terminated or revoked. Performed at [facility], [address], [place], [place] [address]     US Renal  Result Date: 04/28/2019 CLINICAL DATA:  Bilateral flank pain. EXAM: RENAL / URINARY TRACT ULTRASOUND COMPLETE COMPARISON:  None. FINDINGS: Right Kidney: Renal measurements: 11.7 cm x 5.8 cm x 4.7 cm = volume: 166.1 mL . Echogenicity within normal limits. No mass or hydronephrosis visualized. Left Kidney: Renal measurements: 12.2 cm x 5.9 cm x 5.8 cm = volume: 214.7 mL. Echogenicity within normal limits. No mass or hydronephrosis visualized. Bladder: Mild nonspecific urinary bladder wall thickening is noted. Other: None. IMPRESSION: 1. Normal ultrasonographic appearance of the kidneys. 2. Mild nonspecific urinary bladder wall thickening. Electronically Signed   By: Aram Candela M.D.   On: 04/28/2019 23:16    Current scheduled medications . prenatal multivitamin  1 tablet Oral Q1200    I have reviewed the patient's current medications.  ASSESSMENT: Active Problems:   Acute pyelonephritis   PLAN: Continue IV antibiotics and pain medication as needed.    Continue routine antenatal  care.   Doreene Burke, CNM

## 2019-04-30 DIAGNOSIS — N10 Acute tubulo-interstitial nephritis: Secondary | ICD-10-CM

## 2019-04-30 MED ORDER — OXYCODONE HCL 10 MG PO TABS: 10.0000 mg | ORAL_TABLET | ORAL | 0 refills | Status: DC | PRN

## 2019-04-30 MED ORDER — NITROFURANTOIN MONOHYD MACRO 100 MG PO CAPS: 100.0000 mg | ORAL_CAPSULE | Freq: Every day | ORAL | 6 refills | Status: AC

## 2019-04-30 NOTE — Discharge Summary (Signed)
Antenatal Physician Discharge Summary  Patient ID: Tammy Snyder MRN: [id number] DOB/AGE: 16-Dec-1990 29 y.o.  Admit date: 04/28/2019 Discharge date: 04/30/2019  Admission Diagnoses: pyelonephritis in pregnancy   Discharge Diagnoses: same  Prenatal Procedures: Fetal heart tones   Consults: None  Hospital Course:  Tammy Snyder is a 29 y.o. [id number] with IUP at [redacted]w[redacted]d admitted for IV antibiotics therapy,treatment pyelonephritis.Marland Kitchen   She was observed, fetal heart tones remained reassuring, and she improving symptom and no other maternal-fetal concerns. .  She was deemed stable for discharge to home with outpatient follow up.  Discharge Exam: Temp:  [97.5 F (36.4 C)-99.3 F (37.4 C)] 98.6 F (37 C) (04/13 0752) Pulse Rate:  [74-91] 81 (04/13 0752) Resp:  [18-20] 18 (04/13 0752) BP: (94-102)/(51-67) 95/51 (04/13 0752) SpO2:  [98 %-100 %] 99 % (04/13 0752) Physical Examination: CONSTITUTIONAL: Well-developed, well-nourished female in no acute distress.  HENT:  Normocephalic, atraumatic, External right and left ear normal. Oropharynx is clear and moist EYES: Conjunctivae and EOM are normal. Pupils are equal, round, and reactive to light. No scleral icterus.  NECK: Normal range of motion, supple, no masses SKIN: Skin is warm and dry. No rash noted. Not diaphoretic. No erythema. No pallor. NEUROLGIC: Alert and oriented to person, place, and time. Normal reflexes, muscle tone coordination. No cranial nerve deficit noted. PSYCHIATRIC: Normal mood and affect. Normal behavior. Normal judgment and thought content. CARDIOVASCULAR: Normal heart rate noted, regular rhythm RESPIRATORY: Effort and breath sounds normal, no problems with respiration noted MUSCULOSKELETAL: Normal range of motion. No edema and no tenderness. 2+ distal pulses. ABDOMEN: Soft, nontender, nondistended, gravid. CERVIX:    Fetal monitoring: FHR: 143 bpm, Uterine activity: soft, non tender  Significant Diagnostic Studies:   Results for orders placed or performed during the hospital encounter of 04/28/19 (from the past 168 hour(s))  Culture, blood (Routine x 2)   Collection Time: 04/28/19  8:24 PM   Specimen: BLOOD  Result Value Ref Range   Specimen Description BLOOD RIGHT AC    Special Requests      BOTTLES DRAWN AEROBIC AND ANAEROBIC Blood Culture results may not be optimal due to an excessive volume of blood received in culture bottles   Culture      NO GROWTH 2 DAYS Performed at [facility], [address], [place], [place] [address]    Report Status PENDING   Culture, blood (Routine x 2)   Collection Time: 04/28/19  8:24 PM   Specimen: BLOOD  Result Value Ref Range   Specimen Description BLOOD LEFT AC    Special Requests      BOTTLES DRAWN AEROBIC AND ANAEROBIC Blood Culture results may not be optimal due to an excessive volume of blood received in culture bottles   Culture      NO GROWTH 2 DAYS Performed at [facility], [address], [place], [place] [address]    Report Status PENDING   Urine culture   Collection Time: 04/28/19  8:24 PM   Specimen: Urine, Random  Result Value Ref Range   Specimen Description      URINE, RANDOM Performed at [facility], [address], [place], [place] [address]    Special Requests      NONE Performed at [facility], [address], [place], [place] [address]    Culture      CULTURE REINCUBATED FOR BETTER GROWTH Performed at [facility], [address], [place], [place] [address]    Report Status  PENDING   Comprehensive metabolic panel   Collection Time: 04/28/19  8:24 PM  Result Value Ref Range   Sodium 137 135 - 145 mmol/L   Potassium 3.0 (L) 3.5 - 5.1 mmol/L   Chloride 107 98 - 111 mmol/L   CO2 21 (L) 22 - 32 mmol/L   Glucose, Bld 124 (H) 70 - 99 mg/dL   BUN 7 6 - 20 mg/dL   Creatinine, Ser 7.51 (L) 0.44 - 1.00 mg/dL   Calcium 9.1 8.9 - 02.5 mg/dL   Total Protein 6.7 6.5 - 8.1 g/dL    Albumin 3.4 (L) 3.5 - 5.0 g/dL   AST 20 15 - 41 U/L   ALT 22 0 - 44 U/L   Alkaline Phosphatase 123 38 - 126 U/L   Total Bilirubin 0.4 0.3 - 1.2 mg/dL   GFR calc non Af Amer >60 >60 mL/min   GFR calc Af Amer >60 >60 mL/min   Anion gap 9 5 - 15  Lactic acid, plasma   Collection Time: 04/28/19  8:24 PM  Result Value Ref Range   Lactic Acid, Venous 1.4 0.5 - 1.9 mmol/L  CBC with Differential   Collection Time: 04/28/19  8:24 PM  Result Value Ref Range   WBC 12.9 (H) 4.0 - 10.5 K/uL   RBC 3.48 (L) 3.87 - 5.11 MIL/uL   Hemoglobin 11.5 (L) 12.0 - 15.0 g/dL   HCT 85.2 (L) 77.8 - 24.2 %   MCV 95.1 80.0 - 100.0 fL   MCH 33.0 26.0 - 34.0 pg   MCHC 34.7 30.0 - 36.0 g/dL   RDW 35.3 61.4 - 43.1 %   Platelets 170 150 - 400 K/uL   nRBC 0.0 0.0 - 0.2 %   Neutrophils Relative % 78 %   Neutro Abs 10.1 (H) 1.7 - 7.7 K/uL   Lymphocytes Relative 16 %   Lymphs Abs 2.1 0.7 - 4.0 K/uL   Monocytes Relative 5 %   Monocytes Absolute 0.6 0.1 - 1.0 K/uL   Eosinophils Relative 0 %   Eosinophils Absolute 0.1 0.0 - 0.5 K/uL   Basophils Relative 0 %   Basophils Absolute 0.0 0.0 - 0.1 K/uL   Immature Granulocytes 1 %   Abs Immature Granulocytes 0.07 0.00 - 0.07 K/uL  Protime-INR   Collection Time: 04/28/19  8:24 PM  Result Value Ref Range   Prothrombin Time 12.6 11.4 - 15.2 seconds   INR 1.0 0.8 - 1.2  Urinalysis, Complete w Microscopic   Collection Time: 04/28/19  8:24 PM  Result Value Ref Range   Color, Urine YELLOW (A) YELLOW   APPearance CLOUDY (A) CLEAR   Specific Gravity, Urine 1.018 1.005 - 1.030   pH 6.0 5.0 - 8.0   Glucose, UA NEGATIVE NEGATIVE mg/dL   Hgb urine dipstick SMALL (A) NEGATIVE   Bilirubin Urine NEGATIVE NEGATIVE   Ketones, ur NEGATIVE NEGATIVE mg/dL   Protein, ur 540 (A) NEGATIVE mg/dL   Nitrite NEGATIVE NEGATIVE   Leukocytes,Ua MODERATE (A) NEGATIVE   RBC / HPF 11-20 0 - 5 RBC/hpf   WBC, UA >50 (H) 0 - 5 WBC/hpf   Bacteria, UA NONE SEEN NONE SEEN   Squamous Epithelial /  LPF 6-10 0 - 5   Mucus PRESENT    Hyaline Casts, UA PRESENT    Amorphous Crystal PRESENT   Respiratory Panel by RT PCR (Flu A&B, Covid) - Nasopharyngeal Swab   Collection Time: 04/28/19 10:42 PM   Specimen: Nasopharyngeal Swab  Result Value Ref  Range   SARS Coronavirus 2 by RT PCR NEGATIVE NEGATIVE   Influenza A by PCR NEGATIVE NEGATIVE   Influenza B by PCR NEGATIVE NEGATIVE   US OB Comp Less 14 Wks  Result Date: 04/10/2019 Patient Name: Tammy Snyder DOB: 12-22-1990 MRN: [id number] ULTRASOUND REPORT Location: [facility] Date of Service: 04/09/2019 Indications:dating Findings: Nelda Marseille intrauterine pregnancy is visualized with a CRL consistent with [redacted]w[redacted]d gestation, giving an (U/S) EDD of 10/10/2019. FHR: 139 BPM CRL measurement: 76.2 mm Yolk sac is not visualized and early anatomy is normal. Amnion: not visualized Right Ovary is normal in appearance. Left Ovary is normal appearance. Corpus luteal cyst:  is not visualized Survey of the adnexa demonstrates no adnexal masses. There is no free peritoneal fluid in the cul de sac. Impression: 1. [redacted]w[redacted]d Viable [place] Intrauterine pregnancy by U/S. 2. (U/S) EDD is 10/10/2019. Recommendations: 1.Clinical correlation with the patient's History and Physical Exam. Jenine M. Albertine Grates     RDMS I have reviewed this study and agree with documented findings. Rubie Maid, MD [facility]  US Renal  Result Date: 04/28/2019 CLINICAL DATA:  Bilateral flank pain. EXAM: RENAL / URINARY TRACT ULTRASOUND COMPLETE COMPARISON:  None. FINDINGS: Right Kidney: Renal measurements: 11.7 cm x 5.8 cm x 4.7 cm = volume: 166.1 mL . Echogenicity within normal limits. No mass or hydronephrosis visualized. Left Kidney: Renal measurements: 12.2 cm x 5.9 cm x 5.8 cm = volume: 214.7 mL. Echogenicity within normal limits. No mass or hydronephrosis visualized. Bladder: Mild nonspecific urinary bladder wall thickening is noted. Other: None. IMPRESSION: 1. Normal  ultrasonographic appearance of the kidneys. 2. Mild nonspecific urinary bladder wall thickening. Electronically Signed   By: Virgina Norfolk M.D.   On: 04/28/2019 23:16    Future Appointments  Date Time Provider [place]  05/06/2019  2:00 PM Lawhorn, Lara Mulch, CNM [facility] None    Discharge Condition: Stable  Discharge disposition: 01-Home or Self Care        Allergies as of 04/30/2019      Reactions   Latex Rash   Nsaids Rash   Tylenol [acetaminophen] Itching, Rash      Medication List    STOP taking these medications   Prenatal Vitamins 28-0.8 MG Tabs     TAKE these medications   CITRANATAL DHA PO Take 1 tablet by mouth daily.   docusate sodium 100 MG capsule Commonly known as: COLACE Take 1 capsule (100 mg total) by mouth 2 (two) times daily.   Fusion Plus Caps Take 1 tablet by mouth daily.   nitrofurantoin (macrocrystal-monohydrate) 100 MG capsule Commonly known as: Macrobid Take 1 capsule (100 mg total) by mouth daily. What changed: when to take this   Oxycodone HCl 10 MG Tabs Take 1 tablet (10 mg total) by mouth every 4 (four) hours as needed for severe pain.   witch hazel-glycerin pad Commonly known as: TUCKS Apply 1 application topically as needed for hemorrhoids.        Signed: Philip Aspen M.D. 04/30/2019, 8:31 AM

## 2019-04-30 NOTE — Final Progress Note (Signed)
FACULTY PRACTICE ANTEPARTUM COMPREHENSIVE PROGRESS NOTE  Tammy Snyder is a 29 y.o. G4P3003 at [redacted]w[redacted]d who is admitted for pyelonephritis.  Estimated Date of Delivery: 10/10/19 Fetal presentation is unsure.  Length of Stay:  1 Days. Admitted 04/28/2019  Subjective:Doing well, feeling better, denies difficulty urinating.  Patient reports good fetal movement.  She reports no uterine contractions, no bleeding and no loss of fluid per vagina.  Vitals:  Blood pressure (!) 95/51, pulse 81, temperature 98.6 F (37 C), temperature source Oral, resp. rate 18, height 5' 0.5" (1.537 m), weight 65 kg, SpO2 99 %, unknown if currently breastfeeding. Physical Examination: CONSTITUTIONAL: Well-developed, well-nourished female in no acute distress.  HENT:  Normocephalic, atraumatic, External right and left ear normal. Oropharynx is clear and moist EYES: Conjunctivae and EOM are normal. Pupils are equal, round, and reactive to light. No scleral icterus.  NECK: Normal range of motion, supple, no masses SKIN: Skin is warm and dry. No rash noted. Not diaphoretic. No erythema. No pallor. NEUROLGIC: Alert and oriented to person, place, and time. Normal reflexes, muscle tone coordination. No cranial nerve deficit noted. PSYCHIATRIC: Normal mood and affect. Normal behavior. Normal judgment and thought content. CARDIOVASCULAR: Normal heart rate noted, regular rhythm RESPIRATORY: Effort and breath sounds normal, no problems with respiration noted MUSCULOSKELETAL: Normal range of motion. No edema and no tenderness. 2+ distal pulses. ABDOMEN: Soft, nontender, nondistended, gravid. CERVIX:    Fetal monitoring: FHR: 143 bpm, Uterine activity: no contractions per hour  Results for orders placed or performed during the hospital encounter of 04/28/19 (from the past 48 hour(s))  Comprehensive metabolic panel     Status: Abnormal   Collection Time: 04/28/19  8:24 PM  Result Value Ref Range   Sodium 137 135 - 145 mmol/L    Potassium 3.0 (L) 3.5 - 5.1 mmol/L   Chloride 107 98 - 111 mmol/L   CO2 21 (L) 22 - 32 mmol/L   Glucose, Bld 124 (H) 70 - 99 mg/dL    Comment: Glucose reference range applies only to samples taken after fasting for at least 8 hours.   BUN 7 6 - 20 mg/dL   Creatinine, Ser 3.15 (L) 0.44 - 1.00 mg/dL   Calcium 9.1 8.9 - 94.5 mg/dL   Total Protein 6.7 6.5 - 8.1 g/dL   Albumin 3.4 (L) 3.5 - 5.0 g/dL   AST 20 15 - 41 U/L   ALT 22 0 - 44 U/L   Alkaline Phosphatase 123 38 - 126 U/L   Total Bilirubin 0.4 0.3 - 1.2 mg/dL   GFR calc non Af Amer >60 >60 mL/min   GFR calc Af Amer >60 >60 mL/min   Anion gap 9 5 - 15    Comment: Performed at [facility], [address] [place], [place] [address]  Lactic acid, plasma     Status: None   Collection Time: 04/28/19  8:24 PM  Result Value Ref Range   Lactic Acid, Venous 1.4 0.5 - 1.9 mmol/L    Comment: Performed at [facility], [address] [place], [place] [address]  CBC with Differential     Status: Abnormal   Collection Time: 04/28/19  8:24 PM  Result Value Ref Range   WBC 12.9 (H) 4.0 - 10.5 K/uL   RBC 3.48 (L) 3.87 - 5.11 MIL/uL   Hemoglobin 11.5 (L) 12.0 - 15.0 g/dL   HCT 86.3 (L) 81.7 - 71.1 %   MCV 95.1 80.0 - 100.0 fL   MCH 33.0 26.0 -  34.0 pg   MCHC 34.7 30.0 - 36.0 g/dL   RDW 58.5 27.7 - 82.4 %   Platelets 170 150 - 400 K/uL   nRBC 0.0 0.0 - 0.2 %   Neutrophils Relative % 78 %   Neutro Abs 10.1 (H) 1.7 - 7.7 K/uL   Lymphocytes Relative 16 %   Lymphs Abs 2.1 0.7 - 4.0 K/uL   Monocytes Relative 5 %   Monocytes Absolute 0.6 0.1 - 1.0 K/uL   Eosinophils Relative 0 %   Eosinophils Absolute 0.1 0.0 - 0.5 K/uL   Basophils Relative 0 %   Basophils Absolute 0.0 0.0 - 0.1 K/uL   Immature Granulocytes 1 %   Abs Immature Granulocytes 0.07 0.00 - 0.07 K/uL    Comment: Performed at [facility], [address] [place], [place] [address]  Protime-INR     Status: None   Collection Time: 04/28/19  8:24 PM   Result Value Ref Range   Prothrombin Time 12.6 11.4 - 15.2 seconds   INR 1.0 0.8 - 1.2    Comment: (NOTE) INR goal varies based on device and disease states. Performed at [facility], [address] [place], [place] [address]   Culture, blood (Routine x 2)     Status: None (Preliminary result)   Collection Time: 04/28/19  8:24 PM   Specimen: BLOOD  Result Value Ref Range   Specimen Description BLOOD RIGHT AC    Special Requests      BOTTLES DRAWN AEROBIC AND ANAEROBIC Blood Culture results may not be optimal due to an excessive volume of blood received in culture bottles   Culture      NO GROWTH 2 DAYS Performed at [facility], [address] [place], [place] [address]    Report Status PENDING   Culture, blood (Routine x 2)     Status: None (Preliminary result)   Collection Time: 04/28/19  8:24 PM   Specimen: BLOOD  Result Value Ref Range   Specimen Description BLOOD LEFT AC    Special Requests      BOTTLES DRAWN AEROBIC AND ANAEROBIC Blood Culture results may not be optimal due to an excessive volume of blood received in culture bottles   Culture      NO GROWTH 2 DAYS Performed at [facility], [address], [place], [place] [address]    Report Status PENDING   Urinalysis, Complete w Microscopic     Status: Abnormal   Collection Time: 04/28/19  8:24 PM  Result Value Ref Range   Color, Urine YELLOW (A) YELLOW   APPearance CLOUDY (A) CLEAR   Specific Gravity, Urine 1.018 1.005 - 1.030   pH 6.0 5.0 - 8.0   Glucose, UA NEGATIVE NEGATIVE mg/dL   Hgb urine dipstick SMALL (A) NEGATIVE   Bilirubin Urine NEGATIVE NEGATIVE   Ketones, ur NEGATIVE NEGATIVE mg/dL   Protein, ur 509 (A) NEGATIVE mg/dL   Nitrite NEGATIVE NEGATIVE   Leukocytes,Ua MODERATE (A) NEGATIVE   RBC / HPF 11-20 0 - 5 RBC/hpf   WBC, UA >50 (H) 0 - 5 WBC/hpf   Bacteria, UA NONE SEEN NONE SEEN   Squamous Epithelial / LPF 6-10 0 - 5   Mucus PRESENT    Hyaline Casts, UA  PRESENT    Amorphous Crystal PRESENT     Comment: Performed at [facility], [address] [place], [place] [address]  Urine culture     Status: None (Preliminary result)   Collection Time:  04/28/19  8:24 PM   Specimen: Urine, Random  Result Value Ref Range   Specimen Description      URINE, RANDOM Performed at [facility], [address], [place], [place] [address]    Special Requests      NONE Performed at [facility], [address], [place], [place] [address]    Culture      CULTURE REINCUBATED FOR BETTER GROWTH Performed at [facility], [address], [place], [place] [address]    Report Status PENDING   Respiratory Panel by RT PCR (Flu A&B, Covid) - Nasopharyngeal Swab     Status: None   Collection Time: 04/28/19 10:42 PM   Specimen: Nasopharyngeal Swab  Result Value Ref Range   SARS Coronavirus 2 by RT PCR NEGATIVE NEGATIVE    Comment: (NOTE) SARS-CoV-2 target nucleic acids are NOT DETECTED. The SARS-CoV-2 RNA is generally detectable in upper respiratoy specimens during the acute phase of infection. The lowest concentration of SARS-CoV-2 viral copies this assay can detect is 131 copies/mL. A negative result does not preclude SARS-Cov-2 infection and should not be used as the sole basis for treatment or other patient management decisions. A negative result may occur with  improper specimen collection/handling, submission of specimen other than nasopharyngeal swab, presence of viral mutation(s) within the areas targeted by this assay, and inadequate number of viral copies (<131 copies/mL). A negative result must be combined with clinical observations, patient history, and epidemiological information. The expected result is Negative. Fact Sheet for Patients:  https://www.moore.com/ Fact Sheet for Healthcare Providers:  https://www.young.biz/ This test is not yet ap proved or cleared by the  [place] FDA and  has been authorized for detection and/or diagnosis of SARS-CoV-2 by FDA under an Emergency Use Authorization (EUA). This EUA will remain  in effect (meaning this test can be used) for the duration of the COVID-19 declaration under Section 564(b)(1) of the Act, 21 U.S.C. section 360bbb-3(b)(1), unless the authorization is terminated or revoked sooner.    Influenza A by PCR NEGATIVE NEGATIVE   Influenza B by PCR NEGATIVE NEGATIVE    Comment: (NOTE) The Xpert Xpress SARS-CoV-2/FLU/RSV assay is intended as an aid in  the diagnosis of influenza from Nasopharyngeal swab specimens and  should not be used as a sole basis for treatment. Nasal washings and  aspirates are unacceptable for Xpert Xpress SARS-CoV-2/FLU/RSV  testing. Fact Sheet for Patients: https://www.moore.com/ Fact Sheet for Healthcare Providers: https://www.young.biz/ This test is not yet approved or cleared by the [place] FDA and  has been authorized for detection and/or diagnosis of SARS-CoV-2 by  FDA under an Emergency Use Authorization (EUA). This EUA will remain  in effect (meaning this test can be used) for the duration of the  Covid-19 declaration under Section 564(b)(1) of the Act, 21  U.S.C. section 360bbb-3(b)(1), unless the authorization is  terminated or revoked. Performed at [facility], [address], [place], [place] [address]     US Renal  Result Date: 04/28/2019 CLINICAL DATA:  Bilateral flank pain. EXAM: RENAL / URINARY TRACT ULTRASOUND COMPLETE COMPARISON:  None. FINDINGS: Right Kidney: Renal measurements: 11.7 cm x 5.8 cm x 4.7 cm = volume: 166.1 mL . Echogenicity within normal limits. No mass or hydronephrosis visualized. Left Kidney: Renal measurements: 12.2 cm x 5.9 cm x 5.8 cm = volume: 214.7 mL. Echogenicity within normal limits. No mass or hydronephrosis visualized. Bladder: Mild nonspecific urinary bladder wall thickening  is noted. Other: None. IMPRESSION: 1.  Normal ultrasonographic appearance of the kidneys. 2. Mild nonspecific urinary bladder wall thickening. Electronically Signed   By: Virgina Norfolk M.D.   On: 04/28/2019 23:16    Current scheduled medications . prenatal multivitamin  1 tablet Oral Q1200    I have reviewed the patient's current medications.  ASSESSMENT: Active Problems:   Acute pyelonephritis   PLAN: Discharge home today, daily Macrobid suppression. Follow up in office as scheduled on 05/06/19   Philip Aspen, CNM

## 2019-04-30 NOTE — Plan of Care (Signed)
Vs stable; up ad lib; tolerating regular diet; has iv rocephin ordered; FHT every shift; taking oxycodone for pain control; urine has been a great color all shift but once, urine appeared slightly cloudy in measuring container; pt has kpad for her right flank pain

## 2019-04-30 NOTE — Progress Notes (Signed)
DC inst reviewed with pt.  Verb u/o of home meds and f/u appointment.  Discussed to call provider for increased pain, elevated temperature, or concern

## 2019-04-30 NOTE — Discharge Instructions (Signed)
Call for increased pain or bleeding  Call for any concern

## 2019-05-01 LAB — URINE CULTURE: Culture: >=100000 — AB

## 2019-05-01 NOTE — H&P (Signed)
ADMIT NOTE  HPI:      Tammy Snyder is a 29 y.o. [phone] who LMP was No LMP recorded. Patient is pregnant..  Subjective: She presented to the ED with complaint of worsening bilateral lower back pain.  Of significant note, patient is [redacted] weeks pregnant and has previously been treated with Macrobid for urinary tract infection during this pregnancy.  She denies fevers or chills and has had no vomiting despite being occasionally nauseated.  She has no other complaints.           HISTORY Allergies  Allergen Reactions  . Latex Rash  . Nsaids Rash  . Tylenol [Acetaminophen] Itching and Rash    OB History  OB History  Gravida Para Term Preterm AB Living  4 3 3  0 0 3  SAB TAB Ectopic Multiple Live Births  0 0 0 0 3    # Outcome Date GA Lbr Len/2nd Weight Sex Delivery Anes PTL Lv  4 Current           3 Term 10/05/18 [redacted]w[redacted]d 389:29 / 00:16 3090 g F Vag-Spont EPI  LIV  2 Term 02/21/13   3572 g M Vag-Spont  N LIV  1 Term 10/29/10 [redacted]w[redacted]d 29:01 / 03:53 4105 g M CS-LTranv EPI  LIV     Birth Comments: No dysmophic features; voided on OR table.     Past Medical History  Past Medical History:  Diagnosis Date  . Bipolar 1 disorder (HCC)   . Borderline personality disorder (HCC)   . Cochlear implant in place   . Hearing loss   . Interstitial cystitis   . Mental disorder    Borderline personalities disorder  . Oliguria 2013   cystoscopy, hydrodilation under anesthesia  . Polysubstance abuse (HCC)   . Vaginal Pap smear, abnormal     Past Surgical History  Past Surgical History:  Procedure Laterality Date  . Calibration of urethra with dilation of the urethra under anesthesia  04/04/2011   Dr. 04/06/2011  . CESAREAN SECTION  10/29/2010   Procedure: CESAREAN SECTION;  Surgeon: 12/29/2010, MD;  Location: [facility];  Service: Gynecology;;  . CESAREAN SECTION    . COCHLEAR IMPLANT    . COCHLEAR IMPLANT    . cystoscopy, hydrodilation under anesthesia  04/04/2011      Past  Social History:  Social History   Socioeconomic History  . Marital status: Married    Spouse name: 04/06/2011  . Number of children: Not on file  . Years of education: Not on file  . Highest education level: Not on file  Occupational History  . Not on file  Tobacco Use  . Smoking status: Former Smoker    Packs/day: 1.00    Years: 8.00    Pack years: 8.00    Types: Cigarettes    Quit date: 01/02/2018    Years since quitting: 1.3  . Smokeless tobacco: Never Used  Substance and Sexual Activity  . Alcohol use: Not Currently    Alcohol/week: 0.0 standard drinks  . Drug use: No  . Sexual activity: Yes    Partners: Male    Birth control/protection: None    Comment: Nexplanon  Other Topics Concern  . Not on file  Social History Narrative  . Not on file   Social Determinants of Health   Financial Resource Strain: Low Risk   . Difficulty of Paying Living Expenses: Not hard at all  Food Insecurity: No  Food Insecurity  . Worried About Programme researcher, broadcasting/film/video in the Last Year: Never true  . Ran Out of Food in the Last Year: Never true  Transportation Needs: No Transportation Needs  . Lack of Transportation (Medical): No  . Lack of Transportation (Non-Medical): No  Physical Activity: Inactive  . Days of Exercise per Week: 0 days  . Minutes of Exercise per Session: 0 min  Stress: No Stress Concern Present  . Feeling of Stress : Not at all  Social Connections: Unknown  . Frequency of Communication with Friends and Family: More than three times a week  . Frequency of Social Gatherings with Friends and Family: More than three times a week  . Attends Religious Services: Never  . Active Member of Clubs or Organizations: No  . Attends Banker Meetings: Never  . Marital Status: Not on file    Family History  Family History  Problem Relation Age of Onset  . Hypertension Father   . Stroke Father   . Hypertension Mother   . Lung cancer Maternal Grandfather   .  Syncope episode Paternal Grandmother      ROS: Constitutional: Denied constitutional symptoms, night sweats, recent illness, fatigue, fever, insomnia and weight loss.  Eyes: Denied eye symptoms, eye pain, photophobia, vision change and visual disturbance.  Ears/Nose/Throat/Neck: Denied ear, nose, throat or neck symptoms, hearing loss, nasal discharge, sinus congestion and sore throat.  Cardiovascular: Denied cardiovascular symptoms, arrhythmia, chest pain/pressure, edema, exercise intolerance, orthopnea and palpitations.  Respiratory: Denied pulmonary symptoms, asthma, pleuritic pain, productive sputum, cough, dyspnea and wheezing.  Gastrointestinal: Denied, gastro-esophageal reflux, melena, nausea and vomiting.  Genitourinary:  Patient initially had dysuria and was treated with Macrobid she now is experiencing pain bilaterally in her back.  Musculoskeletal: Denied musculoskeletal symptoms, stiffness, swelling, muscle weakness and myalgia.  Dermatologic: Denied dermatology symptoms, rash and scar.  Neurologic: Denied neurology symptoms, dizziness, headache, neck pain and syncope.  Psychiatric: Denied psychiatric symptoms, anxiety and depression.  Endocrine: Denied endocrine symptoms including hot flashes and night sweats.   Medications    Discharge Medication List as of 04/30/2019 10:50 AM    START taking these medications   Details  oxyCODONE 10 MG TABS Take 1 tablet (10 mg total) by mouth every 4 (four) hours as needed for severe pain., Starting Tue 04/30/2019, Normal      CONTINUE these medications which have CHANGED   Details  nitrofurantoin, macrocrystal-monohydrate, (MACROBID) 100 MG capsule Take 1 capsule (100 mg total) by mouth daily., Starting Tue 04/30/2019, Until Thu 05/30/2019, Normal      CONTINUE these medications which have NOT CHANGED   Details  Prenat w/o A-FeCbGl-DSS-FA-DHA (CITRANATAL DHA PO) Take 1 tablet by mouth daily., Historical Med    docusate sodium (COLACE)  100 MG capsule Take 1 capsule (100 mg total) by mouth 2 (two) times daily., Starting Sun 10/07/2018, Normal    Iron-FA-B Cmp-C-Biot-Probiotic (FUSION PLUS) CAPS Take 1 tablet by mouth daily., Starting Wed 08/01/2018, Normal    witch hazel-glycerin (TUCKS) pad Apply 1 application topically as needed for hemorrhoids., Starting Sun 10/07/2018, Normal      STOP taking these medications     Prenatal Vit-Fe Fumarate-FA (PRENATAL VITAMINS) 28-0.8 MG TABS Comments:  Reason for Stopping:         @ENCMED @   Objective: Vitals:   04/30/19 0752 04/30/19 1144  BP: (!) 95/51 104/60  Pulse: 81 90  Resp: 18 20  Temp: 98.6 F (37 C) 98.5 F (36.9 C)  SpO2: 99%      HEENT: Grossly within normal limits.  Normo-cephalic.  Neck Supple.  Pupils reactive.  Thyroid Smooth without nodularity or enlargement.  Skin No rashes, lesions or ulceration  Breasts: No masses or discharge.  Symmetric.  No axillary adenopathy.  Lungs: Clear to auscultation.  No rales or wheezes.  Heart: NSR.  No murmurs or rubs appreciated.  Abdomen: Soft.  Non-tender.  Consistent with 16 weeks pregnancy positive fetal heart tones  Extremities: Moves all appropriately.  Normal ROM for age.  Neuro: Oriented to PPT.  Normal mood.    Pelvic:  Examination deferred            Bilateral renal ultrasound shows no hydronephrosis to suggest obstructive process.    ASSESSMENT:  1.  Pyelonephritis  [redacted] weeks gestation of pregnancy 2.  Patient's main issue is pain.  PLAN: 1.  Rocephin 2.  Pain relief  Jeannie Fend ,MD 05/01/2019,3:33 PM

## 2019-05-03 LAB — CULTURE, BLOOD (ROUTINE X 2)
Culture: NO GROWTH
Culture: NO GROWTH

## 2019-05-06 ENCOUNTER — Other Ambulatory Visit: Payer: Self-pay

## 2019-05-06 ENCOUNTER — Ambulatory Visit (INDEPENDENT_AMBULATORY_CARE_PROVIDER_SITE_OTHER): Payer: Medicaid Other | Admitting: Certified Nurse Midwife

## 2019-05-06 ENCOUNTER — Encounter: Payer: Self-pay | Admitting: Certified Nurse Midwife

## 2019-05-06 VITALS — BP 98/49 | HR 80 | Wt 149.8 lb

## 2019-05-06 DIAGNOSIS — O26812 Pregnancy related exhaustion and fatigue, second trimester: Secondary | ICD-10-CM | POA: Insufficient documentation

## 2019-05-06 DIAGNOSIS — M549 Dorsalgia, unspecified: Secondary | ICD-10-CM

## 2019-05-06 DIAGNOSIS — M25559 Pain in unspecified hip: Secondary | ICD-10-CM

## 2019-05-06 DIAGNOSIS — Z3A17 17 weeks gestation of pregnancy: Secondary | ICD-10-CM

## 2019-05-06 DIAGNOSIS — Z3482 Encounter for supervision of other normal pregnancy, second trimester: Secondary | ICD-10-CM

## 2019-05-06 DIAGNOSIS — Z3689 Encounter for other specified antenatal screening: Secondary | ICD-10-CM

## 2019-05-06 DIAGNOSIS — E876 Hypokalemia: Secondary | ICD-10-CM

## 2019-05-06 DIAGNOSIS — Z98891 History of uterine scar from previous surgery: Secondary | ICD-10-CM

## 2019-05-06 DIAGNOSIS — O09899 Supervision of other high risk pregnancies, unspecified trimester: Secondary | ICD-10-CM

## 2019-05-06 DIAGNOSIS — O99891 Other specified diseases and conditions complicating pregnancy: Secondary | ICD-10-CM

## 2019-05-06 DIAGNOSIS — O26892 Other specified pregnancy related conditions, second trimester: Secondary | ICD-10-CM

## 2019-05-06 DIAGNOSIS — O34219 Maternal care for unspecified type scar from previous cesarean delivery: Secondary | ICD-10-CM

## 2019-05-06 LAB — POCT URINALYSIS DIPSTICK OB
Bilirubin, UA: NEGATIVE
Blood, UA: NEGATIVE
Glucose, UA: NEGATIVE
Ketones, UA: NEGATIVE
Leukocytes, UA: NEGATIVE
Nitrite, UA: NEGATIVE
POC,PROTEIN,UA: NEGATIVE
Spec Grav, UA: 1.015 (ref 1.010–1.025)
Urobilinogen, UA: 0.2 E.U./dL
pH, UA: 7 (ref 5.0–8.0)

## 2019-05-06 NOTE — Progress Notes (Signed)
ROB-Not feeling much better since hospital discharge. Taking Macrobid daily. Drinking water and watermelon juice to stay hydrated and increase potassium levels. Labs today, see orders. Reports back pain and hip pain in pregnancy. Discussed home treatment measures. Referral to Physical Therapy, see orders. Anticipatory guidance regarding course of prenatal care. Reviewed red flag symptoms and when to call. RTC x 3-4 weeks for ANATOMY SCAN and ROB or sooner if needed.   Update given to Gayland Curry, [facility] Pregnancy Case Manager.    Wallene Dales, [facility], [facility]/ Gunnar Bulla, CNM, [facility], [facility]

## 2019-05-06 NOTE — Patient Instructions (Signed)
Second Trimester of Pregnancy  The second trimester is from week 14 through week 27 (month 4 through 6). This is often the time in pregnancy that you feel your best. Often times, morning sickness has lessened or quit. You may have more energy, and you may get hungry more often. Your unborn baby is growing rapidly. At the end of the sixth month, he or she is about 9 inches long and weighs about 1 pounds. You will likely feel the baby move between 18 and 20 weeks of pregnancy. Follow these instructions at home: Medicines  Take over-the-counter and prescription medicines only as told by your doctor. Some medicines are safe and some medicines are not safe during pregnancy.  Take a prenatal vitamin that contains at least 600 micrograms (mcg) of folic acid.  If you have trouble pooping (constipation), take medicine that will make your stool soft (stool softener) if your doctor approves. Eating and drinking   Eat regular, healthy meals.  Avoid raw meat and uncooked cheese.  If you get low calcium from the food you eat, talk to your doctor about taking a daily calcium supplement.  Avoid foods that are high in fat and sugars, such as fried and sweet foods.  If you feel sick to your stomach (nauseous) or throw up (vomit): ? Eat 4 or 5 small meals a day instead of 3 large meals. ? Try eating a few soda crackers. ? Drink liquids between meals instead of during meals.  To prevent constipation: ? Eat foods that are high in fiber, like fresh fruits and vegetables, whole grains, and beans. ? Drink enough fluids to keep your pee (urine) clear or pale yellow. Activity  Exercise only as told by your doctor. Stop exercising if you start to have cramps.  Do not exercise if it is too hot, too humid, or if you are in a place of great height (high altitude).  Avoid heavy lifting.  Wear low-heeled shoes. Sit and stand up straight.  You can continue to have sex unless your doctor tells you not  to. Relieving pain and discomfort  Wear a good support bra if your breasts are tender.  Take warm water baths (sitz baths) to soothe pain or discomfort caused by hemorrhoids. Use hemorrhoid cream if your doctor approves.  Rest with your legs raised if you have leg cramps or low back pain.  If you develop puffy, bulging veins (varicose veins) in your legs: ? Wear support hose or compression stockings as told by your doctor. ? Raise (elevate) your feet for 15 minutes, 3-4 times a day. ? Limit salt in your food. Prenatal care  Write down your questions. Take them to your prenatal visits.  Keep all your prenatal visits as told by your doctor. This is important. Safety  Wear your seat belt when driving.  Make a list of emergency phone numbers, including numbers for family, friends, the hospital, and police and fire departments. General instructions  Ask your doctor about the right foods to eat or for help finding a counselor, if you need these services.  Ask your doctor about local prenatal classes. Begin classes before month 6 of your pregnancy.  Do not use hot tubs, steam rooms, or saunas.  Do not douche or use tampons or scented sanitary pads.  Do not cross your legs for long periods of time.  Visit your dentist if you have not done so. Use a soft toothbrush to brush your teeth. Floss gently.  Avoid all smoking, herbs,   and alcohol. Avoid drugs that are not approved by your doctor.  Do not use any products that contain nicotine or tobacco, such as cigarettes and e-cigarettes. If you need help quitting, ask your doctor.  Avoid cat litter boxes and soil used by cats. These carry germs that can cause birth defects in the baby and can cause a loss of your baby (miscarriage) or stillbirth. Contact a doctor if:  You have mild cramps or pressure in your lower belly.  You have pain when you pee (urinate).  You have bad smelling fluid coming from your vagina.  You continue to  feel sick to your stomach (nauseous), throw up (vomit), or have watery poop (diarrhea).  You have a nagging pain in your belly area.  You feel dizzy. Get help right away if:  You have a fever.  You are leaking fluid from your vagina.  You have spotting or bleeding from your vagina.  You have severe belly cramping or pain.  You lose or gain weight rapidly.  You have trouble catching your breath and have chest pain.  You notice sudden or extreme puffiness (swelling) of your face, hands, ankles, feet, or legs.  You have not felt the baby move in over an hour.  You have severe headaches that do not go away when you take medicine.  You have trouble seeing. Summary  The second trimester is from week 14 through week 27 (months 4 through 6). This is often the time in pregnancy that you feel your best.  To take care of yourself and your unborn baby, you will need to eat healthy meals, take medicines only if your doctor tells you to do so, and do activities that are safe for you and your baby.  Call your doctor if you get sick or if you notice anything unusual about your pregnancy. Also, call your doctor if you need help with the right food to eat, or if you want to know what activities are safe for you. This information is not intended to replace advice given to you by your health care provider. Make sure you discuss any questions you have with your health care provider. Document Revised: 04/27/2018 Document Reviewed: 02/09/2016 Elsevier Patient Education  [place]. Common Medications Safe in Pregnancy  Acne:      Constipation:  Benzoyl Peroxide     Colace  Clindamycin      Dulcolax Suppository  Topica Erythromycin     Fibercon  Salicylic Acid      Metamucil         Miralax AVOID:        Senakot   Accutane    Cough:  Retin-A       Cough Drops  Tetracycline      Phenergan w/ Codeine if Rx  Minocycline      Robitussin (Plain &  DM)  Antibiotics:     Crabs/Lice:  Ceclor       RID  Cephalosporins    AVOID:  E-Mycins      Kwell  Keflex  Macrobid/Macrodantin   Diarrhea:  Penicillin      Kao-Pectate  Zithromax      Imodium AD         PUSH FLUIDS AVOID:       Cipro     Fever:  Tetracycline      Tylenol (Regular or Extra  Minocycline       Strength)  Levaquin      Extra Strength-Do not  Exceed 8 tabs/24 hrs Caffeine:        <235m/day (equiv. To 1 cup of coffee or  approx. 3 12 oz sodas)         Gas: Cold/Hayfever:       Gas-X  Benadryl      Mylicon  Claritin       Phazyme  **Claritin-D        Chlor-Trimeton    Headaches:  Dimetapp      ASA-Free Excedrin  Drixoral-Non-Drowsy     Cold Compress  Mucinex (Guaifenasin)     Tylenol (Regular or Extra  Sudafed/Sudafed-12 Hour     Strength)  **Sudafed PE Pseudoephedrine   Tylenol Cold & Sinus     Vicks Vapor Rub  Zyrtec  **AVOID if Problems With Blood Pressure         Heartburn: Avoid lying down for at least 1 hour after meals  Aciphex      Maalox     Rash:  Milk of Magnesia     Benadryl    Mylanta       1% Hydrocortisone Cream  Pepcid  Pepcid Complete   Sleep Aids:  Prevacid      Ambien   Prilosec       Benadryl  Rolaids       Chamomile Tea  Tums (Limit 4/day)     Unisom  Zantac       Tylenol PM         Warm milk-add vanilla or  Hemorrhoids:       Sugar for taste  Anusol/Anusol H.C.  (RX: Analapram 2.5%)  Sugar Substitutes:  Hydrocortisone OTC     Ok in moderation  Preparation H      Tucks        Vaseline lotion applied to tissue with wiping    Herpes:     Throat:  Acyclovir      Oragel  Famvir  Valtrex     Vaccines:         Flu Shot Leg Cramps:       *Gardasil  Benadryl      Hepatitis A         Hepatitis B Nasal Spray:       Pneumovax  Saline Nasal Spray     Polio Booster         Tetanus Nausea:       Tuberculosis test or PPD  Vitamin B6 25 mg TID   AVOID:    Dramamine      *Gardasil  Emetrol       Live  Poliovirus  Ginger Root 250 mg QID    MMR (measles, mumps &  High Complex Carbs @ Bedtime    rebella)  Sea Bands-Accupressure    Varicella (Chickenpox)  Unisom 1/2 tab TID     *No known complications           If received before Pain:         Known pregnancy;   Darvocet       Resume series after  Lortab        Delivery  Percocet    Yeast:   Tramadol      Femstat  Tylenol 3      Gyne-lotrimin  Ultram       Monistat  Vicodin           MISC:         All Sunscreens  Hair Coloring/highlights          Insect Repellant's          (Including DEET)         Mystic Tans Breastfeeding  Choosing to breastfeed is one of the best decisions you can make for yourself and your baby. A change in hormones during pregnancy causes your breasts to make breast milk in your milk-producing glands. Hormones prevent breast milk from being released before your baby is born. They also prompt milk flow after birth. Once breastfeeding has begun, thoughts of your baby, as well as his or her sucking or crying, can stimulate the release of milk from your milk-producing glands. Benefits of breastfeeding Research shows that breastfeeding offers many health benefits for infants and mothers. It also offers a cost-free and convenient way to feed your baby. For your baby  Your first milk (colostrum) helps your baby's digestive system to function better.  Special cells in your milk (antibodies) help your baby to fight off infections.  Breastfed babies are less likely to develop asthma, allergies, obesity, or type 2 diabetes. They are also at lower risk for sudden infant death syndrome (SIDS).  Nutrients in breast milk are better able to meet your baby's needs compared to infant formula.  Breast milk improves your baby's brain development. For you  Breastfeeding helps to create a very special bond between you and your baby.  Breastfeeding is convenient. Breast milk costs nothing and is always available at the  correct temperature.  Breastfeeding helps to burn calories. It helps you to lose the weight that you gained during pregnancy.  Breastfeeding makes your uterus return faster to its size before pregnancy. It also slows bleeding (lochia) after you give birth.  Breastfeeding helps to lower your risk of developing type 2 diabetes, osteoporosis, rheumatoid arthritis, cardiovascular disease, and breast, ovarian, uterine, and endometrial cancer later in life. Breastfeeding basics Starting breastfeeding  Find a comfortable place to sit or lie down, with your neck and back well-supported.  Place a pillow or a rolled-up blanket under your baby to bring him or her to the level of your breast (if you are seated). Nursing pillows are specially designed to help support your arms and your baby while you breastfeed.  Make sure that your baby's tummy (abdomen) is facing your abdomen.  Gently massage your breast. With your fingertips, massage from the outer edges of your breast inward toward the nipple. This encourages milk flow. If your milk flows slowly, you may need to continue this action during the feeding.  Support your breast with 4 fingers underneath and your thumb above your nipple (make the letter "C" with your hand). Make sure your fingers are well away from your nipple and your baby's mouth.  Stroke your baby's lips gently with your finger or nipple.  When your baby's mouth is open wide enough, quickly bring your baby to your breast, placing your entire nipple and as much of the areola as possible into your baby's mouth. The areola is the colored area around your nipple. ? More areola should be visible above your baby's upper lip than below the lower lip. ? Your baby's lips should be opened and extended outward (flanged) to ensure an adequate, comfortable latch. ? Your baby's tongue should be between his or her lower gum and your breast.  Make sure that your baby's mouth is correctly positioned  around your nipple (latched). Your baby's lips should create a seal on your breast and be turned   out (everted).  It is common for your baby to suck about 2-3 minutes in order to start the flow of breast milk. Latching Teaching your baby how to latch onto your breast properly is very important. An improper latch can cause nipple pain, decreased milk supply, and poor weight gain in your baby. Also, if your baby is not latched onto your nipple properly, he or she may swallow some air during feeding. This can make your baby fussy. Burping your baby when you switch breasts during the feeding can help to get rid of the air. However, teaching your baby to latch on properly is still the best way to prevent fussiness from swallowing air while breastfeeding. Signs that your baby has successfully latched onto your nipple  Silent tugging or silent sucking, without causing you pain. Infant's lips should be extended outward (flanged).  Swallowing heard between every 3-4 sucks once your milk has started to flow (after your let-down milk reflex occurs).  Muscle movement above and in front of his or her ears while sucking. Signs that your baby has not successfully latched onto your nipple  Sucking sounds or smacking sounds from your baby while breastfeeding.  Nipple pain. If you think your baby has not latched on correctly, slip your finger into the corner of your baby's mouth to break the suction and place it between your baby's gums. Attempt to start breastfeeding again. Signs of successful breastfeeding Signs from your baby  Your baby will gradually decrease the number of sucks or will completely stop sucking.  Your baby will fall asleep.  Your baby's body will relax.  Your baby will retain a small amount of milk in his or her mouth.  Your baby will let go of your breast by himself or herself. Signs from you  Breasts that have increased in firmness, weight, and size 1-3 hours after  feeding.  Breasts that are softer immediately after breastfeeding.  Increased milk volume, as well as a change in milk consistency and color by the fifth day of breastfeeding.  Nipples that are not sore, cracked, or bleeding. Signs that your baby is getting enough milk  Wetting at least 1-2 diapers during the first 24 hours after birth.  Wetting at least 5-6 diapers every 24 hours for the first week after birth. The urine should be clear or pale yellow by the age of 5 days.  Wetting 6-8 diapers every 24 hours as your baby continues to grow and develop.  At least 3 stools in a 24-hour period by the age of 5 days. The stool should be soft and yellow.  At least 3 stools in a 24-hour period by the age of 7 days. The stool should be seedy and yellow.  No loss of weight greater than 10% of birth weight during the first 3 days of life.  Average weight gain of 4-7 oz (113-198 g) per week after the age of 4 days.  Consistent daily weight gain by the age of 5 days, without weight loss after the age of 2 weeks. After a feeding, your baby may spit up a small amount of milk. This is normal. Breastfeeding frequency and duration Frequent feeding will help you make more milk and can prevent sore nipples and extremely full breasts (breast engorgement). Breastfeed when you feel the need to reduce the fullness of your breasts or when your baby shows signs of hunger. This is called "breastfeeding on demand." Signs that your baby is hungry include:  Increased alertness, activity,  or restlessness.  Movement of the head from side to side.  Opening of the mouth when the corner of the mouth or cheek is stroked (rooting).  Increased sucking sounds, smacking lips, cooing, sighing, or squeaking.  Hand-to-mouth movements and sucking on fingers or hands.  Fussing or crying. Avoid introducing a pacifier to your baby in the first 4-6 weeks after your baby is born. After this time, you may choose to use a  pacifier. Research has shown that pacifier use during the first year of a baby's life decreases the risk of sudden infant death syndrome (SIDS). Allow your baby to feed on each breast as long as he or she wants. When your baby unlatches or falls asleep while feeding from the first breast, offer the second breast. Because newborns are often sleepy in the first few weeks of life, you may need to awaken your baby to get him or her to feed. Breastfeeding times will vary from baby to baby. However, the following rules can serve as a guide to help you make sure that your baby is properly fed:  Newborns (babies 40 weeks of age or younger) may breastfeed every 1-3 hours.  Newborns should not go without breastfeeding for longer than 3 hours during the day or 5 hours during the night.  You should breastfeed your baby a minimum of 8 times in a 24-hour period. Breast milk pumping     Pumping and storing breast milk allows you to make sure that your baby is exclusively fed your breast milk, even at times when you are unable to breastfeed. This is especially important if you go back to work while you are still breastfeeding, or if you are not able to be present during feedings. Your lactation consultant can help you find a method of pumping that works best for you and give you guidelines about how long it is safe to store breast milk. Caring for your breasts while you breastfeed Nipples can become dry, cracked, and sore while breastfeeding. The following recommendations can help keep your breasts moisturized and healthy:  Avoid using soap on your nipples.  Wear a supportive bra designed especially for nursing. Avoid wearing underwire-style bras or extremely tight bras (sports bras).  Air-dry your nipples for 3-4 minutes after each feeding.  Use only cotton bra pads to absorb leaked breast milk. Leaking of breast milk between feedings is normal.  Use lanolin on your nipples after breastfeeding. Lanolin  helps to maintain your skin's normal moisture barrier. Pure lanolin is not harmful (not toxic) to your baby. You may also hand express a few drops of breast milk and gently massage that milk into your nipples and allow the milk to air-dry. In the first few weeks after giving birth, some women experience breast engorgement. Engorgement can make your breasts feel heavy, warm, and tender to the touch. Engorgement peaks within 3-5 days after you give birth. The following recommendations can help to ease engorgement:  Completely empty your breasts while breastfeeding or pumping. You may want to start by applying warm, moist heat (in the shower or with warm, water-soaked hand towels) just before feeding or pumping. This increases circulation and helps the milk flow. If your baby does not completely empty your breasts while breastfeeding, pump any extra milk after he or she is finished.  Apply ice packs to your breasts immediately after breastfeeding or pumping, unless this is too uncomfortable for you. To do this: ? Put ice in a plastic bag. ? Place a  towel between your skin and the bag. ? Leave the ice on for 20 minutes, 2-3 times a day.  Make sure that your baby is latched on and positioned properly while breastfeeding. If engorgement persists after 48 hours of following these recommendations, contact your health care provider or a Science writer. Overall health care recommendations while breastfeeding  Eat 3 healthy meals and 3 snacks every day. Well-nourished mothers who are breastfeeding need an additional 450-500 calories a day. You can meet this requirement by increasing the amount of a balanced diet that you eat.  Drink enough water to keep your urine pale yellow or clear.  Rest often, relax, and continue to take your prenatal vitamins to prevent fatigue, stress, and low vitamin and mineral levels in your body (nutrient deficiencies).  Do not use any products that contain nicotine or  tobacco, such as cigarettes and e-cigarettes. Your baby may be harmed by chemicals from cigarettes that pass into breast milk and exposure to secondhand smoke. If you need help quitting, ask your health care provider.  Avoid alcohol.  Do not use illegal drugs or marijuana.  Talk with your health care provider before taking any medicines. These include over-the-counter and prescription medicines as well as vitamins and herbal supplements. Some medicines that may be harmful to your baby can pass through breast milk.  It is possible to become pregnant while breastfeeding. If birth control is desired, ask your health care provider about options that will be safe while breastfeeding your baby. Where to find more information: [employer]: www.llli.org Contact a health care provider if:  You feel like you want to stop breastfeeding or have become frustrated with breastfeeding.  Your nipples are cracked or bleeding.  Your breasts are red, tender, or warm.  You have: ? Painful breasts or nipples. ? A swollen area on either breast. ? A fever or chills. ? Nausea or vomiting. ? Drainage other than breast milk from your nipples.  Your breasts do not become full before feedings by the fifth day after you give birth.  You feel sad and depressed.  Your baby is: ? Too sleepy to eat well. ? Having trouble sleeping. ? More than 19 week old and wetting fewer than 6 diapers in a 24-hour period. ? Not gaining weight by 61 days of age.  Your baby has fewer than 3 stools in a 24-hour period.  Your baby's skin or the white parts of his or her eyes become yellow. Get help right away if:  Your baby is overly tired (lethargic) and does not want to wake up and feed.  Your baby develops an unexplained fever. Summary  Breastfeeding offers many health benefits for infant and mothers.  Try to breastfeed your infant when he or she shows early signs of hunger.  Gently tickle or stroke  your baby's lips with your finger or nipple to allow the baby to open his or her mouth. Bring the baby to your breast. Make sure that much of the areola is in your baby's mouth. Offer one side and burp the baby before you offer the other side.  Talk with your health care provider or lactation consultant if you have questions or you face problems as you breastfeed. This information is not intended to replace advice given to you by your health care provider. Make sure you discuss any questions you have with your health care provider. Document Revised: 03/30/2017 Document Reviewed: 02/05/2016 Elsevier Patient Education  [place].

## 2019-05-06 NOTE — Progress Notes (Signed)
ROB-Pt present for routine prenatal care. Pt stated that she is still not feeling well and is very tired and has no energy to do anything.

## 2019-05-07 LAB — COMPREHENSIVE METABOLIC PANEL
ALT: 23 IU/L (ref 0–32)
AST: 17 IU/L (ref 0–40)
Albumin/Globulin Ratio: 1.6 (ref 1.2–2.2)
Albumin: 3.5 g/dL — ABNORMAL LOW (ref 3.9–5.0)
Alkaline Phosphatase: 134 IU/L — ABNORMAL HIGH (ref 39–117)
BUN/Creatinine Ratio: 16 (ref 9–23)
BUN: 6 mg/dL (ref 6–20)
Bilirubin Total: <0.2 mg/dL (ref 0.0–1.2)
CO2: 19 mmol/L — ABNORMAL LOW (ref 20–29)
Calcium: 9.3 mg/dL (ref 8.7–10.2)
Chloride: 106 mmol/L (ref 96–106)
Creatinine, Ser: 0.38 mg/dL — ABNORMAL LOW (ref 0.57–1.00)
GFR calc Af Amer: 167 mL/min/{1.73_m2} (ref >59)
GFR calc non Af Amer: 145 mL/min/{1.73_m2} (ref >59)
Globulin, Total: 2.2 g/dL (ref 1.5–4.5)
Glucose: 63 mg/dL — ABNORMAL LOW (ref 65–99)
Potassium: 3.8 mmol/L (ref 3.5–5.2)
Sodium: 139 mmol/L (ref 134–144)
Total Protein: 5.7 g/dL — ABNORMAL LOW (ref 6.0–8.5)

## 2019-05-07 LAB — VITAMIN B12: Vitamin B-12: 576 pg/mL (ref 232–1245)

## 2019-05-07 LAB — VITAMIN D 25 HYDROXY (VIT D DEFICIENCY, FRACTURES): Vit D, 25-Hydroxy: 30.2 ng/mL (ref 30.0–100.0)

## 2019-05-07 NOTE — Progress Notes (Signed)
Please contact patient. Potassium, B-12, and vitamin D levels normal. Encourage to activate MyChart. Thanks, JML

## 2019-05-11 ENCOUNTER — Emergency Department: Payer: Medicaid Other

## 2019-05-11 ENCOUNTER — Other Ambulatory Visit: Payer: Self-pay

## 2019-05-11 ENCOUNTER — Emergency Department
Admission: EM | Admit: 2019-05-11 | Discharge: 2019-05-12 | Disposition: A | Discharge status: Home / Self Care | Payer: Medicaid Other | Attending: Emergency Medicine | Admitting: Emergency Medicine

## 2019-05-11 DIAGNOSIS — R42 Dizziness and giddiness: Secondary | ICD-10-CM | POA: Insufficient documentation

## 2019-05-11 DIAGNOSIS — Z87891 Personal history of nicotine dependence: Secondary | ICD-10-CM | POA: Insufficient documentation

## 2019-05-11 DIAGNOSIS — Z91040 Latex allergy status: Secondary | ICD-10-CM | POA: Insufficient documentation

## 2019-05-11 DIAGNOSIS — R0602 Shortness of breath: Secondary | ICD-10-CM | POA: Diagnosis not present

## 2019-05-11 LAB — CBC
HCT: 32.3 % — ABNORMAL LOW (ref 36.0–46.0)
Hemoglobin: 11.4 g/dL — ABNORMAL LOW (ref 12.0–15.0)
MCH: 33.5 pg (ref 26.0–34.0)
MCHC: 35.3 g/dL (ref 30.0–36.0)
MCV: 95 fL (ref 80.0–100.0)
Platelets: 182 10*3/uL (ref 150–400)
RBC: 3.4 MIL/uL — ABNORMAL LOW (ref 3.87–5.11)
RDW: 13.4 % (ref 11.5–15.5)
WBC: 10.3 10*3/uL (ref 4.0–10.5)
nRBC: 0 % (ref 0.0–0.2)

## 2019-05-11 LAB — BASIC METABOLIC PANEL
Anion gap: 8 (ref 5–15)
BUN: 8 mg/dL (ref 6–20)
CO2: 21 mmol/L — ABNORMAL LOW (ref 22–32)
Calcium: 8.6 mg/dL — ABNORMAL LOW (ref 8.9–10.3)
Chloride: 106 mmol/L (ref 98–111)
Creatinine, Ser: 0.42 mg/dL — ABNORMAL LOW (ref 0.44–1.00)
GFR calc Af Amer: >60 mL/min (ref >60)
GFR calc non Af Amer: >60 mL/min (ref >60)
Glucose, Bld: 87 mg/dL (ref 70–99)
Potassium: 3 mmol/L — ABNORMAL LOW (ref 3.5–5.1)
Sodium: 135 mmol/L (ref 135–145)

## 2019-05-11 LAB — HEPATIC FUNCTION PANEL
ALT: 18 U/L (ref 0–44)
AST: 18 U/L (ref 15–41)
Albumin: 3.2 g/dL — ABNORMAL LOW (ref 3.5–5.0)
Alkaline Phosphatase: 95 U/L (ref 38–126)
Bilirubin, Direct: <0.1 mg/dL (ref 0.0–0.2)
Total Bilirubin: 0.3 mg/dL (ref 0.3–1.2)
Total Protein: 6.4 g/dL — ABNORMAL LOW (ref 6.5–8.1)

## 2019-05-11 LAB — URINALYSIS, COMPLETE (UACMP) WITH MICROSCOPIC
Bilirubin Urine: NEGATIVE
Glucose, UA: NEGATIVE mg/dL
Hgb urine dipstick: NEGATIVE
Ketones, ur: NEGATIVE mg/dL
Leukocytes,Ua: NEGATIVE
Nitrite: NEGATIVE
Protein, ur: NEGATIVE mg/dL
Specific Gravity, Urine: 1.025 (ref 1.005–1.030)
pH: 6.5 (ref 5.0–8.0)

## 2019-05-11 LAB — GLUCOSE, CAPILLARY: Glucose-Capillary: 81 mg/dL (ref 70–99)

## 2019-05-11 MED ORDER — POTASSIUM CHLORIDE CRYS ER 20 MEQ PO TBCR
40.0000 meq | EXTENDED_RELEASE_TABLET | Freq: Once | ORAL | Status: AC
  Administered 2019-05-11: 40 meq via ORAL
  Filled 2019-05-11: qty 2

## 2019-05-11 MED ORDER — LORAZEPAM 1 MG PO TABS
1.0000 mg | ORAL_TABLET | Freq: Once | ORAL | Status: AC
  Administered 2019-05-11: 1 mg via ORAL
  Filled 2019-05-11: qty 1

## 2019-05-11 NOTE — ED Triage Notes (Addendum)
Pt arrives to ED via POV c/o lightheadedness approx 1 hour ago follow by SOB. Pt reports frequent episodes of lightheadedness throughout pregnancy related to electrolyte issues. Pt reports lower back pain radiating across stomach, cramping. Pt [redacted] weeks pregnant. Pt currently taking Macrobid for kidney infection. No vaginal bleeding. Pt in NAD. RR even but increased in rate. Pt appears anxious, tearful. No audible wheezing noted.

## 2019-05-11 NOTE — ED Provider Notes (Signed)
[facility] Emergency Department Provider Note       Time seen: ----------------------------------------- 9:44 PM on 05/11/2019 -----------------------------------------   I have reviewed the triage vital signs and the nursing notes.  HISTORY   Chief Complaint Dizziness and Shortness of Breath   HPI Tammy Snyder is a 29 y.o. female with a history of bipolar disorder, borderline personality disorder, polysubstance abuse who presents to the ED for lightheadedness followed by shortness of breath.  Patient reports frequent episodes of lightheadedness throughout pregnancy related to electrolyte issues.  Patient states she is [redacted] weeks pregnant, denies any bleeding.  Past Medical History:  Diagnosis Date  . Bipolar 1 disorder (HCC)   . Borderline personality disorder (HCC)   . Cochlear implant in place   . Hearing loss   . Interstitial cystitis   . Mental disorder    Borderline personalities disorder  . Oliguria 2013   cystoscopy, hydrodilation under anesthesia  . Polysubstance abuse (HCC)   . Vaginal Pap smear, abnormal     Patient Active Problem List   Diagnosis Date Noted  . Hypokalemia 05/06/2019  . Short interval between pregnancies affecting pregnancy, antepartum 05/06/2019  . Back pain affecting pregnancy in second trimester 05/06/2019  . Pregnancy related hip pain in second trimester, antepartum 05/06/2019  . Pregnancy related fatigue in second trimester 05/06/2019  . Acute pyelonephritis 04/28/2019  . Vaginal birth after cesarean 10/05/2018  . History of hypokalemia 09/13/2018  . Pregnancy 08/03/2018  . Palpitations 07/04/2018  . Orthostatic lightheadedness 07/04/2018  . Syncope and collapse 07/03/2018  . History of chlamydia 07/02/2018  . Desires VBAC (vaginal birth after cesarean) trial 07/02/2018  . Hearing loss 03/06/2018  . History of cesarean delivery 03/06/2018  . History of VBAC 03/06/2018    Past Surgical History:  Procedure  Laterality Date  . Calibration of urethra with dilation of the urethra under anesthesia  04/04/2011   Dr. Rosezetta Schlatter  . CESAREAN SECTION  10/29/2010   Procedure: CESAREAN SECTION;  Surgeon: Lazaro Arms, MD;  Location: [facility];  Service: Gynecology;;  . CESAREAN SECTION    . COCHLEAR IMPLANT    . COCHLEAR IMPLANT    . cystoscopy, hydrodilation under anesthesia  04/04/2011    Allergies Latex, Nsaids, and Tylenol [acetaminophen]  Social History Social History   Tobacco Use  . Smoking status: Former Smoker    Packs/day: 1.00    Years: 8.00    Pack years: 8.00    Types: Cigarettes    Quit date: 01/02/2018    Years since quitting: 1.3  . Smokeless tobacco: Never Used  Substance Use Topics  . Alcohol use: Not Currently    Alcohol/week: 0.0 standard drinks  . Drug use: No    Review of Systems Constitutional: Negative for fever. Cardiovascular: Negative for chest pain. Respiratory: Positive for shortness of breath Gastrointestinal: Negative for abdominal pain, vomiting and diarrhea. Musculoskeletal: Negative for back pain. Skin: Negative for rash. Neurological: Negative for headaches, focal weakness or numbness.  All systems negative/normal/unremarkable except as stated in the HPI  ____________________________________________   PHYSICAL EXAM:  VITAL SIGNS: ED Triage Vitals  Enc Vitals Group     BP 05/11/19 1909 (!) 122/50     Pulse Rate 05/11/19 1909 92     Resp 05/11/19 1909 18     Temp 05/11/19 1912 99 F (37.2 C)     Temp Source 05/11/19 1912 Oral     SpO2 05/11/19 1909 100 %     Weight 05/11/19  1909 147 lb 11.3 oz (67 kg)     Height 05/11/19 1909 5\' 1"  (1.549 m)     Head Circumference --      Peak Flow --      Pain Score 05/11/19 1909 6     Pain Loc --      Pain Edu? --      Excl. in GC? --     Constitutional: Alert and oriented.  Anxious mood and affect Eyes: Conjunctivae are normal. Normal extraocular movements. ENT      Head: Normocephalic and  atraumatic.      Nose: No congestion/rhinnorhea.      Mouth/Throat: Mucous membranes are moist.      Neck: No stridor. Cardiovascular: Normal rate, regular rhythm. No murmurs, rubs, or gallops. Respiratory: Normal respiratory effort without tachypnea nor retractions. Breath sounds are clear and equal bilaterally. No wheezes/rales/rhonchi. Gastrointestinal: Soft and nontender. Normal bowel sounds.  Left CVA tenderness Musculoskeletal: Nontender with normal range of motion in extremities. No lower extremity tenderness nor edema. Neurologic:  Normal speech and language. No gross focal neurologic deficits are appreciated.  Skin:  Skin is warm, dry and intact. No rash noted. Psychiatric: Anxious ____________________________________________  EKG: Interpreted by me.  Sinus rhythm with a rate of 92 bpm, normal PR interval, normal QRS, normal QT  ____________________________________________  ED COURSE:  As part of my medical decision making, I reviewed the following data within the electronic MEDICAL RECORD NUMBER History obtained from family if available, nursing notes, old chart and ekg, as well as notes from prior ED visits. Patient presented for shortness of breath, we will assess with labs and imaging as indicated at this time. Clinical Course as of May 11 2239  Sat May 11, 2019  2241 Corrected calcium is 9.2   [JW]    Clinical Course User Index [JW] May 13, 2019, MD   Procedures  Tammy Snyder was evaluated in Emergency Department on 05/11/2019 for the symptoms described in the history of present illness. She was evaluated in the context of the global COVID-19 pandemic, which necessitated consideration that the patient might be at risk for infection with the SARS-CoV-2 virus that causes COVID-19. Institutional protocols and algorithms that pertain to the evaluation of patients at risk for COVID-19 are in a state of rapid change based on information released by regulatory bodies including  the CDC and federal and state organizations. These policies and algorithms were followed during the patient's care in the ED.  ____________________________________________   LABS (pertinent positives/negatives)  Labs Reviewed  BASIC METABOLIC PANEL - Abnormal; Notable for the following components:      Result Value   Potassium 3.0 (*)    CO2 21 (*)    Creatinine, Ser 0.42 (*)    Calcium 8.6 (*)    All other components within normal limits  CBC - Abnormal; Notable for the following components:   RBC 3.40 (*)    Hemoglobin 11.4 (*)    HCT 32.3 (*)    All other components within normal limits  URINALYSIS, COMPLETE (UACMP) WITH MICROSCOPIC - Abnormal; Notable for the following components:   Bacteria, UA RARE (*)    All other components within normal limits  GLUCOSE, CAPILLARY  HEPATIC FUNCTION PANEL  CBG MONITORING, ED    RADIOLOGY  Chest x-ray IMPRESSION: No active cardiopulmonary disease. Pregnancy ultrasound IMPRESSION: Single live intrauterine pregnancy. No acute abnormality identified.  This exam is performed on an emergent basis and does not comprehensively evaluate fetal size, dating,  or anatomy; follow-up complete OB US should be considered if further fetal assessment is warranted. ____________________________________________   DIFFERENTIAL DIAGNOSIS   Physiologic changes of pregnancy, dehydration, electrolyte abnormality, anemia, occult infection, PE  FINAL ASSESSMENT AND PLAN  Dizziness   Plan: The patient had presented for dizziness. Patient's labs revealed some mild irregularities but overall no acute process.  She was given oral potassium and Ativan.  Patient's imaging did not reveal any acute process.  I think some of her dizziness is anxiety related.  Overall she is cleared for outpatient follow-up.   Laurence Aly, MD    Note: This note was generated in part or whole with voice recognition software. Voice recognition is usually quite  accurate but there are transcription errors that can and very often do occur. I apologize for any typographical errors that were not detected and corrected.     Earleen Newport, MD 05/11/19 2242

## 2019-05-15 ENCOUNTER — Ambulatory Visit: Payer: Medicaid Other | Attending: Certified Nurse Midwife

## 2019-05-15 ENCOUNTER — Other Ambulatory Visit: Payer: Self-pay

## 2019-05-15 DIAGNOSIS — M533 Sacrococcygeal disorders, not elsewhere classified: Secondary | ICD-10-CM | POA: Insufficient documentation

## 2019-05-15 DIAGNOSIS — R293 Abnormal posture: Secondary | ICD-10-CM

## 2019-05-15 DIAGNOSIS — M62838 Other muscle spasm: Secondary | ICD-10-CM

## 2019-05-15 NOTE — Therapy (Signed)
[place] [facility] MAIN [facility] SERVICES [address] [place], [place], [address] Phone: [phone]   Fax:  [phone]  Physical Therapy Evaluation  The patient has been informed of current processes in place at Outpatient Rehab to protect patients from Covid-19 exposure including social distancing, schedule modifications, and new cleaning procedures. After discussing their particular risk with a therapist based on the patient's personal risk factors, the patient has decided to proceed with in-person therapy.   Patient Details  Name: Tammy Snyder MRN: [id number] Date of Birth: [birth year] No data recorded  Encounter Date: 05/15/2019  PT End of Session - 05/15/19 1620    Visit Number  1    Number of Visits  10    Date for PT Re-Evaluation  07/24/19    Authorization Type  mcaid    Authorization Time Period  4/28-5/19    Authorization - Visit Number  1    Authorization - Number of Visits  4    Progress Note Due on Visit  10    PT Start Time  1014    PT Stop Time  1105    PT Time Calculation (min)  51 min    Activity Tolerance  Patient tolerated treatment well    Behavior During Therapy  [facility] for tasks assessed/performed       Past Medical History:  Diagnosis Date  . Bipolar 1 disorder (HCC)   . Borderline personality disorder (HCC)   . Cochlear implant in place   . Hearing loss   . Interstitial cystitis   . Mental disorder    Borderline personalities disorder  . Oliguria 2013   cystoscopy, hydrodilation under anesthesia  . Polysubstance abuse (HCC)   . Vaginal Pap smear, abnormal     Past Surgical History:  Procedure Laterality Date  . Calibration of urethra with dilation of the urethra under anesthesia  04/04/2011   Dr. Rosezetta Schlatter  . CESAREAN SECTION  10/29/2010   Procedure: CESAREAN SECTION;  Surgeon: Lazaro Arms, MD;  Location: [facility];  Service: Gynecology;;  . CESAREAN SECTION    . COCHLEAR IMPLANT    . COCHLEAR IMPLANT    .  cystoscopy, hydrodilation under anesthesia  04/04/2011    There were no vitals filed for this visit.  Pelvic Floor Physical Therapy Evaluation and Assessment  SCREENING  Falls in last 6 mo: yes, fell down stairs ~3 months ago. 4 stairs. Landed flat on her back. Was pregnant but did not find out until she was ~ 16 weeks.     Patient's communication preference:   Red Flags:  Have you had any night sweats? Yes ( 4 weeks) Unexplained weight loss? none Saddle anesthesia? no Unexplained changes in bowel or bladder habits? no  SUBJECTIVE  Patient reports: Was in the hospital 3 weeks ago with a kidney infection, taking Macrobid daily. Drinks 12 17 oz bottles of water per day.   Her pain increases when she "does a lot" of house-chores or goes outside, etc. She has been told she needs to rest because she is still recovering from the kidney infection.  Has a hard time sleeping, only getting ~ 3-4 hours per night, mostly from pain.  Precautions:  Syncope and collapse, orthostatic lightheadedness, Bipolar 1 disorder, borderline personality disorder, coclear implant/hearing loss, IC, polysubstance abuse, c-section, Hypokalemia, Hx of Oliguria and 2 VBAC's as well as short duration between pregnancies.   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 G4P3, [redacted] weeks pregnant, due date is 1 year from prior delivery.  Gynecological History: H/o syphilis   Urinary History: Urinating between 2x/hr to every 2 hours throughout the day. Sometimes waits too long to go to the bathroom. Has some SUI that requires her to change her pants when she coughs/sneezes.  Gastrointestinal History: Has a BM every-other day.  Sexual activity/pain: none  Location of pain: LB and L>R hip Current pain:  5/10  Max pain:  10/10 Least pain:  2/10 Nature of pain: sharp stabs, ache  base-line.  Patient Goals: Be able to sleep for at least 6 hours. Be able to be on her feet doing IADL's for ~ 2 hours without her pain increasing greater than 3/10   OBJECTIVE  Posture/Observations:  Sitting: fidgeting, switching positions to help ease discomfort. Standing: ambidextrous, TR shoulder low,hyperkyphotic/lordotic, L PSIS and ASIS high.    Palpation/Segmental Motion/Joint Play: TTP at R iliac crest, L>R ilium, Psoas, pectineus,  Special tests:   Scoliosis: ~ 14 in. From floor, R Lumbar, L thoracic curve.  LLD: LLE ~ 1cm long (pelvic obliquity remains)   Range of Motion/Flexibilty:  Spine: R SB ~ 5 fingers from knee, L SB ~ 3 fingers from knee. R ROT ~ 30 Deg. With pain in L mid-back. L ROT ~ 15 deg. With pain in R SIJ Hips:   Strength/MMT: Deferred to follow-up LE MMT  LE MMT Left Right  Hip flex:  (L2) /5 /5  Hip ext: /5 /5  Hip abd: /5 /5  Hip add: /5 /5  Hip IR /5 /5  Hip ER /5 /5     Abdominal:  Palpation: TTP to L>R hip-flexors Diastasis: ~ 6 finger diastasis  Pelvic Floor External Exam: Deferred to follow-up Introitus Appears:  Skin integrity:  Palpation: Cough: Prolapse visible?: Scar mobility:  Internal Vaginal Exam: Deferred to follow-up Strength (PERF):  Symmetry: Palpation: Prolapse:   Gait Analysis: Deferred to follow-up   Pelvic Floor Outcome Measures: PGQ: 48/75 (considered pain only, not BP/fainting)   INTERVENTIONS THIS SESSION: There-act: Educated on the structure and function of the pelvic floor in relation to their symptoms as well as the POC, and initial HEP in order to set patient expectations and understanding from which we will build on in the future sessions.   Total time: 51 min.                  Objective measurements completed on examination: See above findings.                PT Short Term Goals - 05/15/19 1634      PT SHORT TERM GOAL #1   Title  Patient will demonstrate  improved pelvic alignment and balance of musculature surrounding the pelvis to facilitate decreased PFM spasms and decrease pelvic pain.    Baseline  L anterior rotation/up-slip Vs. LLE long    Time  5    Period  Weeks    Status  New    Target Date  06/19/19      PT SHORT TERM GOAL #2   Title  Patient will report a reduction in pain to no greater than 6/10 over the prior week to demonstrate symptom improvement.    Baseline  max pain of 10/10 with IADL's    Time  5    Period  Weeks    Status  New    Target Date  06/19/19  PT SHORT TERM GOAL #3   Title  Patient will demonstrate HEP x1 in the clinic to demonstrate understanding and proper form to allow for further improvement.    Baseline  Pt. lacks knowledge of what therapeutic exercises will decrease her Pain/Sx    Time  5    Period  Weeks    Status  New    Target Date  06/19/19        PT Long Term Goals - 05/15/19 1638      PT LONG TERM GOAL #1   Title  Patient will report no episodes of SUI over the course of the prior two weeks to demonstrate improved functional ability.    Baseline  Having to change her pants due to SUI with cough/sneeze    Time  10    Period  Weeks    Status  New    Target Date  07/24/19      PT LONG TERM GOAL #2   Title  Pt. will be able to participate in IADL's for up to 2 hours without pain increase >3/10    Baseline  Has increased pain with house-hold chores and playing with kids to 10/10 sometimes as early as 10 min. into activity.    Time  10    Period  Weeks    Status  New    Target Date  07/24/19      PT LONG TERM GOAL #3   Title  Patient will score at or below 30% on the PGQ to demonstrate a clinically significant decrease in disability and improved functional ability.    Baseline  GQ: 48/75 (64%) (considered pain only, not BP/fainting)    Time  10    Period  Weeks    Status  New    Target Date  07/24/19             Plan - 05/15/19 1621    Clinical Impression Statement   Pt. is a 29 y/o female who presents today with cheif c/o LBP and L hip pain following a fall and during her first trimester of pregnancy. Her PMH is significant for Syncope and collapse, orthostatic lightheadedness, Bipolar 1 disorder, borderline personality disorder, coclear implant/hearing loss, IC, polysubstance abuse, c-section, Hypokalemia, Hx of Oliguria and 2 VBAC's as well as short duration between pregnancies. Her clinical assessment revealed a pelvic obliquity as well as possible LLD with the LLE long, spasms surrounding the L>R hip, a large diastasis recti, decreased spinal and hip ROM, and Hx. indicating PFM dysfunction. She will benefit from skilled pelvic health PT to address the noted deficits and to assess for and address any other potential causes of Sx.    Personal Factors and Comorbidities  Comorbidity 3+;Social Background;Finances;Past/Current Experience    Comorbidities  Syncope and collapse, orthostatic lightheadedness, Bipolar 1 disorder, borderline personality disorder, coclear implant/hearing loss, IC, polysubstance abuse, c-section, Hypokalemia, Hx of Oliguria and 2 VBAC's as well as short duration between pregnancies.    Examination-Activity Limitations  Continence;Stairs;Stand;Squat;Sit;Sleep    Examination-Participation Restrictions  Community Activity;Cleaning;Laundry;Meal Prep;Yard Work    Stability/Clinical Decision Making  Unstable/Unpredictable    Scientist, water quality    Rehab Potential  Good    PT Frequency  1x / week    PT Duration  Other (comment)   10 weeks   PT Treatment/Interventions  ADLs/Self Care Home Management;Biofeedback;Moist Heat;Traction;Ultrasound;Therapeutic activities;Functional mobility training;Stair training;Gait training;Therapeutic exercise;Balance training;Neuromuscular re-education;Patient/family education;Manual techniques;Scar mobilization;Taping;Dry needling;Passive range of motion;Spinal Manipulations;Joint Manipulations  PT Next  Visit Plan  Decrease spasms surrounding L hip and correct for obliquity, re-measeure LLD and correct PRN.    Consulted and Agree with Plan of Care  Patient       Patient will benefit from skilled therapeutic intervention in order to improve the following deficits and impairments:  Increased muscle spasms, Improper body mechanics, Decreased activity tolerance, Decreased coordination, Decreased strength, Hypermobility, Increased fascial restricitons, Impaired flexibility, Postural dysfunction, Pain, Decreased range of motion  Visit Diagnosis: Sacrococcygeal disorders, not elsewhere classified  Abnormal posture  Other muscle spasm     Problem List Patient Active Problem List   Diagnosis Date Noted  . Hypokalemia 05/06/2019  . Short interval between pregnancies affecting pregnancy, antepartum 05/06/2019  . Back pain affecting pregnancy in second trimester 05/06/2019  . Pregnancy related hip pain in second trimester, antepartum 05/06/2019  . Pregnancy related fatigue in second trimester 05/06/2019  . Acute pyelonephritis 04/28/2019  . Vaginal birth after cesarean 10/05/2018  . History of hypokalemia 09/13/2018  . Pregnancy 08/03/2018  . Palpitations 07/04/2018  . Orthostatic lightheadedness 07/04/2018  . Syncope and collapse 07/03/2018  . History of chlamydia 07/02/2018  . Desires VBAC (vaginal birth after cesarean) trial 07/02/2018  . Hearing loss 03/06/2018  . History of cesarean delivery 03/06/2018  . History of VBAC 03/06/2018   Willa Rough DPT, ATC Willa Rough 05/15/2019, 4:43 PM  [place] MAIN [facility] SERVICES [address] [place], [place], [address] Phone: [phone]   Fax:  [phone]  Name: Sanaa Zilberman MRN: [id number] Date of Birth: March 01, 1990

## 2019-05-24 ENCOUNTER — Ambulatory Visit: Payer: Medicaid Other | Attending: Certified Nurse Midwife

## 2019-05-24 ENCOUNTER — Other Ambulatory Visit: Payer: Self-pay

## 2019-05-24 DIAGNOSIS — M62838 Other muscle spasm: Secondary | ICD-10-CM | POA: Diagnosis present

## 2019-05-24 DIAGNOSIS — R293 Abnormal posture: Secondary | ICD-10-CM

## 2019-05-24 DIAGNOSIS — M533 Sacrococcygeal disorders, not elsewhere classified: Secondary | ICD-10-CM | POA: Diagnosis not present

## 2019-05-24 NOTE — Patient Instructions (Signed)
Pelvic Tilt With Pelvic Floor (Hook-Lying)        Take a small belly breath then start to exhale through pursed lips and pull the low tummy muscles in as you flatten the low back, finally pull your ribs together in the front to push the last bit of air out. Repeat 2x15, 1 time per day.

## 2019-05-24 NOTE — Therapy (Signed)
[place] [place] [facility] MAIN [facility] SERVICES [address] [place], [place], [address] Phone: [phone]   Fax:  [phone]  Physical Therapy Treatment  The patient has been informed of current processes in place at Outpatient Rehab to protect patients from Covid-19 exposure including social distancing, schedule modifications, and new cleaning procedures. After discussing their particular risk with a therapist based on the patient's personal risk factors, the patient has decided to proceed with in-person therapy.   Patient Details  Name: Tammy Snyder MRN: [id number] Date of Birth: [birth year] No data recorded  Encounter Date: 05/24/2019  PT End of Session - 05/24/19 1113    Visit Number  2    Number of Visits  10    Date for PT Re-Evaluation  07/24/19    Authorization Type  mcaid    Authorization Time Period  4/28-5/19    Authorization - Visit Number  2    Authorization - Number of Visits  4    Progress Note Due on Visit  10    PT Start Time  1010    PT Stop Time  1110    PT Time Calculation (min)  60 min    Activity Tolerance  Patient tolerated treatment well;No increased pain    Behavior During Therapy  WFL for tasks assessed/performed       Past Medical History:  Diagnosis Date  . Bipolar 1 disorder (HCC)   . Borderline personality disorder (HCC)   . Cochlear implant in place   . Hearing loss   . Interstitial cystitis   . Mental disorder    Borderline personalities disorder  . Oliguria 2013   cystoscopy, hydrodilation under anesthesia  . Polysubstance abuse (HCC)   . Vaginal Pap smear, abnormal     Past Surgical History:  Procedure Laterality Date  . Calibration of urethra with dilation of the urethra under anesthesia  04/04/2011   Dr. Rosezetta Schlatter  . CESAREAN SECTION  10/29/2010   Procedure: CESAREAN SECTION;  Surgeon: Lazaro Arms, MD;  Location: [facility];  Service: Gynecology;;  . CESAREAN SECTION    . COCHLEAR IMPLANT    . COCHLEAR  IMPLANT    . cystoscopy, hydrodilation under anesthesia  04/04/2011    There were no vitals filed for this visit.    Pelvic Floor Physical Therapy Treatment Note  SCREENING  Changes in medications, allergies, or medical history?: none    SUBJECTIVE  Patient reports: She is having a good back day today, got compression stockings ~ 3 days ago. Has not had abdominal pain since she started doing log-roll transfer.   Precautions:  [redacted] weeks pregnant, heard of hearing  Location of pain: R hip Current pain: 2/10  Max pain: 9/10 Least pain: 2/10 Nature of pain:sharp stabs, ache base-line.  Patient Goals: Be able to sleep for at least 6 hours. Be able to be on her feet doing IADL's for ~ 2 hours without her pain increasing greater than 3/10   OBJECTIVE  Changes in: Posture/Observations:  L anterior rotation, hyperlordosis.  *resolved following treatment.  Range of Motion/Flexibilty:    Strength/MMT:  LE MMT:  Pelvic floor:  Abdominal:  ~ 4 finger diastasis dome with supine to lon-g sit transfer.  Palpation: TTP to L Iliacus, B QL and Piriformis, R pectineus  Gait Analysis: Hyperlordotic, leaning back, lurching B.  INTERVENTIONS THIS SESSION: Manual: Performed TP release to L Iliacus, QL and Piriformis, and R pectineus to decrease spasm and pain and allow for improved  balance of musculature for improved function and decreased symptoms.  Therex: Educated on and practiced posterior pelvic tilts with TA/glutes/Obliques timing to allow for improved coordination and recruitment of muscles surrounding the pelvis to decrease diastasis and LBP.  Total time: 60 min.                           PT Short Term Goals - 05/15/19 1634      PT SHORT TERM GOAL #1   Title  Patient will demonstrate improved pelvic alignment and balance of musculature surrounding the pelvis to facilitate decreased PFM spasms and decrease pelvic pain.    Baseline  L  anterior rotation/up-slip Vs. LLE long    Time  5    Period  Weeks    Status  New    Target Date  06/19/19      PT SHORT TERM GOAL #2   Title  Patient will report a reduction in pain to no greater than 6/10 over the prior week to demonstrate symptom improvement.    Baseline  max pain of 10/10 with IADL's    Time  5    Period  Weeks    Status  New    Target Date  06/19/19      PT SHORT TERM GOAL #3   Title  Patient will demonstrate HEP x1 in the clinic to demonstrate understanding and proper form to allow for further improvement.    Baseline  Pt. lacks knowledge of what therapeutic exercises will decrease her Pain/Sx    Time  5    Period  Weeks    Status  New    Target Date  06/19/19        PT Long Term Goals - 05/15/19 1638      PT LONG TERM GOAL #1   Title  Patient will report no episodes of SUI over the course of the prior two weeks to demonstrate improved functional ability.    Baseline  Having to change her pants due to SUI with cough/sneeze    Time  10    Period  Weeks    Status  New    Target Date  07/24/19      PT LONG TERM GOAL #2   Title  Pt. will be able to participate in IADL's for up to 2 hours without pain increase >3/10    Baseline  Has increased pain with house-hold chores and playing with kids to 10/10 sometimes as early as 10 min. into activity.    Time  10    Period  Weeks    Status  New    Target Date  07/24/19      PT LONG TERM GOAL #3   Title  Patient will score at or below 30% on the PGQ to demonstrate a clinically significant decrease in disability and improved functional ability.    Baseline  PGQ: 48/75 (64%) (considered pain only, not BP/fainting)    Time  10    Period  Weeks    Status  New    Target Date  07/24/19            Plan - 05/24/19 1114    Clinical Impression Statement  Pt. Responded well to all interventions today, demonstrating improved pelvic alignment, resolution of pain, and decreased spasm/TTP as well as understanding  and correct performance of all education and exercises provided today. They will continue to benefit from skilled physical therapy to  work toward remaining goals and maximize function as well as decrease likelihood of symptom increase or recurrence.    PT Next Visit Plan  Posture and gait mechanics training, hip ABD and glute strengthening.    PT Home Exercise Plan  Posterior pelvic tilts with timing of TA/Glutes/obliques.    Consulted and Agree with Plan of Care  Patient       Patient will benefit from skilled therapeutic intervention in order to improve the following deficits and impairments:     Visit Diagnosis: Sacrococcygeal disorders, not elsewhere classified  Abnormal posture  Other muscle spasm     Problem List Patient Active Problem List   Diagnosis Date Noted  . Hypokalemia 05/06/2019  . Short interval between pregnancies affecting pregnancy, antepartum 05/06/2019  . Back pain affecting pregnancy in second trimester 05/06/2019  . Pregnancy related hip pain in second trimester, antepartum 05/06/2019  . Pregnancy related fatigue in second trimester 05/06/2019  . Acute pyelonephritis 04/28/2019  . Vaginal birth after cesarean 10/05/2018  . History of hypokalemia 09/13/2018  . Pregnancy 08/03/2018  . Palpitations 07/04/2018  . Orthostatic lightheadedness 07/04/2018  . Syncope and collapse 07/03/2018  . History of chlamydia 07/02/2018  . Desires VBAC (vaginal birth after cesarean) trial 07/02/2018  . Hearing loss 03/06/2018  . History of cesarean delivery 03/06/2018  . History of VBAC 03/06/2018   Willa Rough DPT, ATC Willa Rough 05/24/2019, 11:15 AM  [place] MAIN [facility] SERVICES [address] [place], [place], [address] Phone: [phone]   Fax:  [phone]  Name: Evangelia Whitaker MRN: [id number] Date of Birth: 02/09/1990

## 2019-05-31 ENCOUNTER — Ambulatory Visit: Payer: Medicaid Other

## 2019-06-04 ENCOUNTER — Encounter: Payer: Medicaid Other | Admitting: Certified Nurse Midwife

## 2019-06-04 ENCOUNTER — Other Ambulatory Visit: Payer: Medicaid Other

## 2019-06-07 ENCOUNTER — Ambulatory Visit: Payer: Medicaid Other

## 2019-06-10 ENCOUNTER — Ambulatory Visit: Payer: Medicaid Other

## 2019-06-10 ENCOUNTER — Other Ambulatory Visit: Payer: Self-pay

## 2019-06-10 ENCOUNTER — Encounter: Payer: Self-pay | Admitting: Certified Nurse Midwife

## 2019-06-10 ENCOUNTER — Ambulatory Visit (INDEPENDENT_AMBULATORY_CARE_PROVIDER_SITE_OTHER): Payer: Medicaid Other | Admitting: Certified Nurse Midwife

## 2019-06-10 VITALS — BP 104/55 | HR 85 | Wt 160.0 lb

## 2019-06-10 DIAGNOSIS — Z3A22 22 weeks gestation of pregnancy: Secondary | ICD-10-CM | POA: Diagnosis not present

## 2019-06-10 LAB — POCT URINALYSIS DIPSTICK OB
Bilirubin, UA: NEGATIVE
Blood, UA: NEGATIVE
Glucose, UA: NEGATIVE
Ketones, UA: NEGATIVE
Leukocytes, UA: NEGATIVE
Nitrite, UA: NEGATIVE
POC,PROTEIN,UA: NEGATIVE
Spec Grav, UA: >=1.03 — AB (ref 1.010–1.025)
Urobilinogen, UA: 0.2 E.U./dL
pH, UA: 5 (ref 5.0–8.0)

## 2019-06-10 NOTE — Patient Instructions (Signed)

## 2019-06-10 NOTE — Addendum Note (Signed)
Addended by: Brooke Dare on: 06/10/2019 02:29 PM   Modules accepted: Orders

## 2019-06-10 NOTE — Progress Notes (Signed)
ROB doing well. Feels movement. Was supposed to have anatomy u/s today but was late for appointment. Appointment was rescheduled for Thursday. She will return then. ROB with Michelle 4 wks.   Doreene Burke, CNM

## 2019-06-13 ENCOUNTER — Other Ambulatory Visit (INDEPENDENT_AMBULATORY_CARE_PROVIDER_SITE_OTHER): Payer: Medicaid Other | Admitting: Certified Nurse Midwife

## 2019-06-13 ENCOUNTER — Other Ambulatory Visit: Payer: Medicaid Other

## 2019-06-13 DIAGNOSIS — Z3482 Encounter for supervision of other normal pregnancy, second trimester: Secondary | ICD-10-CM

## 2019-06-13 DIAGNOSIS — Z3689 Encounter for other specified antenatal screening: Secondary | ICD-10-CM

## 2019-06-13 DIAGNOSIS — Z3A23 23 weeks gestation of pregnancy: Secondary | ICD-10-CM

## 2019-06-13 NOTE — Progress Notes (Signed)
Order placed for anatomy scan, see chart.    Gunnar Bulla, CNM [facility], [facility] 06/13/19 10:24 AM

## 2019-06-14 ENCOUNTER — Ambulatory Visit: Payer: Medicaid Other

## 2019-06-14 ENCOUNTER — Telehealth: Payer: Self-pay | Admitting: Certified Nurse Midwife

## 2019-06-14 NOTE — Telephone Encounter (Signed)
Called pt to make appt for anatomy scan for the next open spot. Pt will be seen next week

## 2019-06-19 ENCOUNTER — Ambulatory Visit (INDEPENDENT_AMBULATORY_CARE_PROVIDER_SITE_OTHER): Payer: Medicaid Other

## 2019-06-19 ENCOUNTER — Other Ambulatory Visit: Payer: Self-pay

## 2019-06-19 DIAGNOSIS — Z3689 Encounter for other specified antenatal screening: Secondary | ICD-10-CM

## 2019-06-19 DIAGNOSIS — Z3A23 23 weeks gestation of pregnancy: Secondary | ICD-10-CM

## 2019-06-19 DIAGNOSIS — Z3482 Encounter for supervision of other normal pregnancy, second trimester: Secondary | ICD-10-CM

## 2019-06-21 ENCOUNTER — Ambulatory Visit: Payer: Medicaid Other

## 2019-06-25 NOTE — Telephone Encounter (Signed)
Error

## 2019-06-28 ENCOUNTER — Ambulatory Visit: Payer: Medicaid Other

## 2019-07-03 ENCOUNTER — Other Ambulatory Visit: Payer: Self-pay

## 2019-07-03 ENCOUNTER — Observation Stay
Admission: EM | Admit: 2019-07-03 | Discharge: 2019-07-04 | Disposition: A | Discharge status: Home / Self Care | Payer: Medicaid Other | Attending: Certified Nurse Midwife | Admitting: Certified Nurse Midwife

## 2019-07-03 DIAGNOSIS — S3991XA Unspecified injury of abdomen, initial encounter: Secondary | ICD-10-CM

## 2019-07-03 DIAGNOSIS — Z3A26 26 weeks gestation of pregnancy: Secondary | ICD-10-CM | POA: Insufficient documentation

## 2019-07-03 DIAGNOSIS — O26892 Other specified pregnancy related conditions, second trimester: Principal | ICD-10-CM | POA: Insufficient documentation

## 2019-07-04 ENCOUNTER — Encounter: Payer: Self-pay | Admitting: Certified Nurse Midwife

## 2019-07-04 ENCOUNTER — Observation Stay: Payer: Medicaid Other

## 2019-07-04 ENCOUNTER — Other Ambulatory Visit: Payer: Self-pay

## 2019-07-04 DIAGNOSIS — Z3A26 26 weeks gestation of pregnancy: Secondary | ICD-10-CM | POA: Diagnosis not present

## 2019-07-04 DIAGNOSIS — S3991XA Unspecified injury of abdomen, initial encounter: Secondary | ICD-10-CM

## 2019-07-04 DIAGNOSIS — O26892 Other specified pregnancy related conditions, second trimester: Secondary | ICD-10-CM | POA: Diagnosis not present

## 2019-07-04 DIAGNOSIS — R109 Unspecified abdominal pain: Secondary | ICD-10-CM | POA: Diagnosis present

## 2019-07-04 LAB — WET PREP, GENITAL
Sperm: NONE SEEN
Trich, Wet Prep: NONE SEEN
Yeast Wet Prep HPF POC: NONE SEEN

## 2019-07-04 LAB — URINALYSIS, ROUTINE W REFLEX MICROSCOPIC
Bilirubin Urine: NEGATIVE
Glucose, UA: NEGATIVE mg/dL
Hgb urine dipstick: NEGATIVE
Ketones, ur: NEGATIVE mg/dL
Leukocytes,Ua: NEGATIVE
Nitrite: NEGATIVE
Protein, ur: NEGATIVE mg/dL
Specific Gravity, Urine: 1.005 (ref 1.005–1.030)
pH: 7 (ref 5.0–8.0)

## 2019-07-04 MED ORDER — CYCLOBENZAPRINE HCL 5 MG PO TABS: 2.5000 mg | ORAL_TABLET | Freq: Three times a day (TID) | ORAL | 0 refills | Status: DC | PRN

## 2019-07-04 MED ORDER — LACTATED RINGERS IV SOLN: INTRAVENOUS | Status: DC

## 2019-07-04 MED ORDER — HYDROCORTISONE ACETATE 25 MG RE SUPP
25.0000 mg | Freq: Two times a day (BID) | RECTAL | 1 refills | Status: DC
Start: 2019-07-04 — End: 2019-09-12

## 2019-07-04 MED ORDER — CYCLOBENZAPRINE HCL 10 MG PO TABS
5.0000 mg | ORAL_TABLET | Freq: Once | ORAL | Status: AC
  Administered 2019-07-04: 5 mg via ORAL
  Filled 2019-07-04: qty 0.5

## 2019-07-04 NOTE — OB Triage Note (Signed)
Pt came to [facility] with complaints of abdominal, back pain. Pt stated that about an hour ago, her 72 month old ran into her belly. Pt stated no vaginal bleeding, just discharge. Pt mentions a stabbing pain in the front of her abdomen, rates an 8/10. Pt states she has been able to feel baby move.

## 2019-07-04 NOTE — OB Triage Note (Addendum)
L&D OB Triage Note  SUBJECTIVE Tammy Snyder is a 29 y.o. G4P3003 female at [redacted]w[redacted]d, EDD Estimated Date of Delivery: 10/10/19 who presented to triage with complaints of abdominal trama. Her 3 month old child "head butted" her in her abdomen at approximately 11 pm. She now has back pain and abdominal pain. She denies LOF and vaginal bleeding and has good fetal movement.   OB History  Gravida Para Term Preterm AB Living  4 3 3  0 0 3  SAB TAB Ectopic Multiple Live Births  0 0 0 0 3    # Outcome Date GA Lbr Len/2nd Weight Sex Delivery Anes PTL Lv  4 Current           3 Term 10/05/18 [redacted]w[redacted]d 389:29 / 00:16 3090 g F Vag-Spont EPI  LIV     Name: HISAE, DECOURSEY     Apgar1: 8  Apgar5: 9  2 Term 02/21/13   3572 g M Vag-Spont  N LIV  1 Term 10/29/10 [redacted]w[redacted]d 29:01 / 03:53 4105 g M CS-LTranv EPI  LIV     Birth Comments: No dysmophic features; voided on OR table.      Name: KADA, FRIESEN     Apgar1: 8  Apgar5: 9    Medications Prior to Admission  Medication Sig Dispense Refill Last Dose  . nitrofurantoin, macrocrystal-monohydrate, (MACROBID) 100 MG capsule Take 100 mg by mouth 2 (two) times daily.   07/04/2019 at Unknown time  . Prenat w/o A-FeCbGl-DSS-FA-DHA (CITRANATAL DHA PO) Take 1 tablet by mouth daily.   07/04/2019 at Unknown time  . oxyCODONE 10 MG TABS Take 1 tablet (10 mg total) by mouth every 4 (four) hours as needed for severe pain. (Patient not taking: Reported on 05/15/2019) 10 tablet 0 Not Taking at Unknown time     OBJECTIVE  Nursing Evaluation:   BP (!) 119/59 (BP Location: Right Arm)   Pulse 82   Temp 98.3 F (36.8 C) (Oral)   Resp 16    Findings:Reactive strip with initial irritability that resolved after IV hydration. Flexeril given for musculoskeletal pain ( allergy to tylenol). Limited OB ultrasound normal.  Abdomen soft, no rebound pain or guarding.          NST was performed and has been reviewed by me.  NST INTERPRETATION: Category I  Mode: External Baseline  Rate (A): 140 bpm (fht) Variability: Moderate Accelerations: 15 x 15 Decelerations: None     Contraction Frequency (min): UI  CLINICAL DATA:  Abdominal trauma, abdominal pain  EXAM: LIMITED OBSTETRIC ULTRASOUND  FINDINGS: Number of Fetuses: 1  Heart Rate:  145 bpm  Movement: Yes  Presentation: Cephalic  Placental Location: Right lateral  Previa: No  Amniotic Fluid (Subjective):  Within normal limits.  AFI:  cm  BPD: 6.62 cm 26 w  5 d  MATERNAL FINDINGS:  Cervix:  Appears closed.  Uterus/Adnexae: No abnormality visualized.  IMPRESSION: Approximately 26 week 5 day intrauterine pregnancy. Fetal heart rate 145 beats per minute. No abnormalities visualized.  This exam is performed on an emergent basis and does not comprehensively evaluate fetal size, dating, or anatomy; follow-up complete OB US should be considered if further fetal assessment is warranted.   Electronically Signed   By: Rolm Baptise M.D.   On: 07/04/2019 01:48   ASSESSMENT Impression:  1.  Pregnancy:  [id number] at [redacted]w[redacted]d , EDD Estimated Date of Delivery: 10/10/19 2.  Reassuring fetal and maternal status 3.  No signs of abruption   PLAN 1.  Discussed current condition and above findings with patient and reassurance given.  All questions answered. Red flag symptoms reviewed.  2. Discharge home with standard precautions given to return to L&D or call the office for problems. Follow up in office as scheduled on 21 st.  3. Continue routine prenatal care.  I was present and examined the patient in person.   Doreene Burke, CNM

## 2019-07-04 NOTE — OB Triage Note (Signed)
Pt discharged home in stable condition. RN provided discharge instructions. Pt verbalized understanding, no questions at this time.

## 2019-07-05 ENCOUNTER — Ambulatory Visit: Payer: Medicaid Other

## 2019-07-08 ENCOUNTER — Encounter: Payer: Self-pay | Admitting: Certified Nurse Midwife

## 2019-07-08 ENCOUNTER — Ambulatory Visit (INDEPENDENT_AMBULATORY_CARE_PROVIDER_SITE_OTHER): Payer: Medicaid Other | Admitting: Certified Nurse Midwife

## 2019-07-08 ENCOUNTER — Other Ambulatory Visit: Payer: Self-pay

## 2019-07-08 VITALS — BP 113/69 | HR 96 | Wt 166.2 lb

## 2019-07-08 DIAGNOSIS — Z3482 Encounter for supervision of other normal pregnancy, second trimester: Secondary | ICD-10-CM

## 2019-07-08 DIAGNOSIS — Z3A26 26 weeks gestation of pregnancy: Secondary | ICD-10-CM

## 2019-07-08 DIAGNOSIS — O09899 Supervision of other high risk pregnancies, unspecified trimester: Secondary | ICD-10-CM

## 2019-07-08 LAB — POCT URINALYSIS DIPSTICK OB
Bilirubin, UA: NEGATIVE
Blood, UA: NEGATIVE
Glucose, UA: NEGATIVE
Ketones, UA: NEGATIVE
Leukocytes, UA: NEGATIVE
Nitrite, UA: NEGATIVE
POC,PROTEIN,UA: NEGATIVE
Spec Grav, UA: <=1.005 — AB (ref 1.010–1.025)
Urobilinogen, UA: 0.2 E.U./dL
pH, UA: 7 (ref 5.0–8.0)

## 2019-07-08 NOTE — Progress Notes (Signed)
Patient comes in today for ROB visit. She has been having some contractions since her child head butted her in the belly. Patient went to ED for this. Patient wants to sign tubal form today.

## 2019-07-08 NOTE — Progress Notes (Signed)
ROB-Reports irregular contractions. Discussed home treatment measures including increased water and protein intake. Concerned for preterm labor offer FFN; however, notes intercourse last night. Desires FFN at next appointment. Taking Macrobid daily. Desires permanent sterilization; BTL consent signed and copy given to patient. Naming daughter "Beverely Low". Meeting with Karoline Caldwell, [facility] OB Case Manager prior to today's appointment. Anticipatory guidance regarding course of prenatal care. Reviewed red flag symptoms and when to call. RTC x 2-3 weeks for 28 week labs, FFN, and ROB or sooner if needed.

## 2019-07-08 NOTE — Patient Instructions (Signed)

## 2019-07-12 ENCOUNTER — Ambulatory Visit: Payer: Medicaid Other

## 2019-07-19 ENCOUNTER — Ambulatory Visit: Payer: Medicaid Other

## 2019-07-23 ENCOUNTER — Ambulatory Visit (INDEPENDENT_AMBULATORY_CARE_PROVIDER_SITE_OTHER): Payer: Medicaid Other | Admitting: Certified Nurse Midwife

## 2019-07-23 ENCOUNTER — Other Ambulatory Visit: Payer: Medicaid Other

## 2019-07-23 ENCOUNTER — Encounter: Payer: Self-pay | Admitting: Certified Nurse Midwife

## 2019-07-23 ENCOUNTER — Other Ambulatory Visit: Payer: Self-pay

## 2019-07-23 VITALS — BP 110/64 | HR 92

## 2019-07-23 DIAGNOSIS — Z23 Encounter for immunization: Secondary | ICD-10-CM

## 2019-07-23 DIAGNOSIS — Z3A28 28 weeks gestation of pregnancy: Secondary | ICD-10-CM

## 2019-07-23 LAB — POCT URINALYSIS DIPSTICK OB
Bilirubin, UA: NEGATIVE
Blood, UA: NEGATIVE
Glucose, UA: NEGATIVE
Ketones, UA: NEGATIVE
Leukocytes, UA: NEGATIVE
Nitrite, UA: NEGATIVE
POC,PROTEIN,UA: NEGATIVE
Spec Grav, UA: 1.015 (ref 1.010–1.025)
Urobilinogen, UA: 0.2 E.U./dL
pH, UA: 5 (ref 5.0–8.0)

## 2019-07-23 MED ORDER — TETANUS-DIPHTH-ACELL PERTUSSIS 5-2.5-18.5 LF-MCG/0.5 IM SUSP
0.5000 mL | Freq: Once | INTRAMUSCULAR | Status: AC
  Administered 2019-07-23: 0.5 mL via INTRAMUSCULAR

## 2019-07-23 NOTE — Progress Notes (Signed)
ROB doing well. Continues to have irregular contractions. FFN collected today. PT states it has been a few days since last intercourse. She feels good movement. 28 wk labs today. Tdap/BTC/RPR/CBC/Glucose screen. She state she may breast feed for a few weeks but then will formula feed. She has a pump already. Reviewed RSB information. See check list for topics reviewed. She is having a PP BTL and sign consents last week. Sample birth plan given. She state she will have epidural even though she has had hot spots with them , they still help with pain. Follow up 2 wk with Marcelino Duster.   Doreene Burke, CNM

## 2019-07-23 NOTE — Patient Instructions (Signed)
Glucose Tolerance Test During Pregnancy Why am I having this test? The glucose tolerance test (GTT) is done to check how your body processes sugar (glucose). This is one of several tests used to diagnose diabetes that develops during pregnancy (gestational diabetes mellitus). Gestational diabetes is a temporary form of diabetes that some women develop during pregnancy. It usually occurs during the second trimester of pregnancy and goes away after delivery. Testing (screening) for gestational diabetes usually occurs between 24 and 28 weeks of pregnancy. You may have the GTT test after having a 1-hour glucose screening test if the results from that test indicate that you may have gestational diabetes. You may also have this test if:  You have a history of gestational diabetes.  You have a history of giving birth to very large babies or have experienced repeated fetal loss (stillbirth).  You have signs and symptoms of diabetes, such as: ? Changes in your vision. ? Tingling or numbness in your hands or feet. ? Changes in hunger, thirst, and urination that are not otherwise explained by your pregnancy. What is being tested? This test measures the amount of glucose in your blood at different times during a period of 3 hours. This indicates how well your body is able to process glucose. What kind of sample is taken?  Blood samples are required for this test. They are usually collected by inserting a needle into a blood vessel. How do I prepare for this test?  For 3 days before your test, eat normally. Have plenty of carbohydrate-rich foods.  Follow instructions from your health care provider about: ? Eating or drinking restrictions on the day of the test. You may be asked to not eat or drink anything other than water (fast) starting 8-10 hours before the test. ? Changing or stopping your regular medicines. Some medicines may interfere with this test. Tell a health care provider about:  All  medicines you are taking, including vitamins, herbs, eye drops, creams, and over-the-counter medicines.  Any blood disorders you have.  Any surgeries you have had.  Any medical conditions you have. What happens during the test? First, your blood glucose will be measured. This is referred to as your fasting blood glucose, since you fasted before the test. Then, you will drink a glucose solution that contains a certain amount of glucose. Your blood glucose will be measured again 1, 2, and 3 hours after drinking the solution. This test takes about 3 hours to complete. You will need to stay at the testing location during this time. During the testing period:  Do not eat or drink anything other than the glucose solution.  Do not exercise.  Do not use any products that contain nicotine or tobacco, such as cigarettes and e-cigarettes. If you need help stopping, ask your health care provider. The testing procedure may vary among health care providers and hospitals. How are the results reported? Your results will be reported as milligrams of glucose per deciliter of blood (mg/dL) or millimoles per liter (mmol/L). Your health care provider will compare your results to normal ranges that were established after testing a large group of people (reference ranges). Reference ranges may vary among labs and hospitals. For this test, common reference ranges are:  Fasting: less than 95-105 mg/dL (5.3-5.8 mmol/L).  1 hour after drinking glucose: less than 180-190 mg/dL (10.0-10.5 mmol/L).  2 hours after drinking glucose: less than 155-165 mg/dL (8.6-9.2 mmol/L).  3 hours after drinking glucose: 140-145 mg/dL (7.8-8.1 mmol/L). What do the   results mean? Results within reference ranges are considered normal, meaning that your glucose levels are well-controlled. If two or more of your blood glucose levels are high, you may be diagnosed with gestational diabetes. If only one level is high, your health care  provider may suggest repeat testing or other tests to confirm a diagnosis. Talk with your health care provider about what your results mean. Questions to ask your health care provider Ask your health care provider, or the department that is doing the test:  When will my results be ready?  How will I get my results?  What are my treatment options?  What other tests do I need?  What are my next steps? Summary  The glucose tolerance test (GTT) is one of several tests used to diagnose diabetes that develops during pregnancy (gestational diabetes mellitus). Gestational diabetes is a temporary form of diabetes that some women develop during pregnancy.  You may have the GTT test after having a 1-hour glucose screening test if the results from that test indicate that you may have gestational diabetes. You may also have this test if you have any symptoms or risk factors for gestational diabetes.  Talk with your health care provider about what your results mean. This information is not intended to replace advice given to you by your health care provider. Make sure you discuss any questions you have with your health care provider. Document Revised: 04/26/2018 Document Reviewed: 08/15/2016 Elsevier Patient Education  2020 Elsevier Inc.  

## 2019-07-24 ENCOUNTER — Other Ambulatory Visit: Payer: Self-pay | Admitting: Certified Nurse Midwife

## 2019-07-24 DIAGNOSIS — O99810 Abnormal glucose complicating pregnancy: Secondary | ICD-10-CM

## 2019-07-24 LAB — CBC
Hematocrit: 32.1 % — ABNORMAL LOW (ref 34.0–46.6)
Hemoglobin: 11 g/dL — ABNORMAL LOW (ref 11.1–15.9)
MCH: 33.2 pg — ABNORMAL HIGH (ref 26.6–33.0)
MCHC: 34.3 g/dL (ref 31.5–35.7)
MCV: 97 fL (ref 79–97)
Platelets: 154 10*3/uL (ref 150–450)
RBC: 3.31 x10E6/uL — ABNORMAL LOW (ref 3.77–5.28)
RDW: 12.6 % (ref 11.7–15.4)
WBC: 9.4 10*3/uL (ref 3.4–10.8)

## 2019-07-24 LAB — RPR: RPR Ser Ql: NONREACTIVE

## 2019-07-24 LAB — GLUCOSE, 1 HOUR GESTATIONAL: Gestational Diabetes Screen: 146 mg/dL — ABNORMAL HIGH (ref 65–139)

## 2019-07-24 LAB — FETAL FIBRONECTIN: Fetal Fibronectin: NEGATIVE

## 2019-07-24 NOTE — Progress Notes (Signed)
1 hr glucose abnormal. 3 hr ordered . Nurse called with results and instructions for 3 hr test.   Doreene Burke, CNM

## 2019-07-24 NOTE — Telephone Encounter (Signed)
Patient called in concerned about her GTT results. Could you please advise?

## 2019-07-26 ENCOUNTER — Ambulatory Visit: Payer: Medicaid Other

## 2019-08-01 ENCOUNTER — Other Ambulatory Visit: Payer: Self-pay

## 2019-08-01 ENCOUNTER — Encounter: Payer: Self-pay | Admitting: Obstetrics and Gynecology

## 2019-08-01 ENCOUNTER — Observation Stay
Admission: EM | Admit: 2019-08-01 | Discharge: 2019-08-01 | Disposition: A | Discharge status: Home / Self Care | Payer: Medicaid Other | Attending: Certified Nurse Midwife | Admitting: Certified Nurse Midwife

## 2019-08-01 DIAGNOSIS — O368130 Decreased fetal movements, third trimester, not applicable or unspecified: Secondary | ICD-10-CM | POA: Diagnosis not present

## 2019-08-01 DIAGNOSIS — Z3A30 30 weeks gestation of pregnancy: Secondary | ICD-10-CM | POA: Diagnosis not present

## 2019-08-01 DIAGNOSIS — Z79899 Other long term (current) drug therapy: Secondary | ICD-10-CM | POA: Diagnosis not present

## 2019-08-01 NOTE — Discharge Summary (Signed)
    L&D OB Triage Note  SUBJECTIVE Tammy Snyder is a 29 y.o. G4P3003 female at [redacted]w[redacted]d, EDD Estimated Date of Delivery: 10/10/19 who presented to triage with complaints of decreased fetal movement, hip pain, vaginal discharge.Denies leaking of fluid and vaginal bleeding.   OB History  Gravida Para Term Preterm AB Living  4 3 3  0 0 3  SAB TAB Ectopic Multiple Live Births  0 0 0 0 3    # Outcome Date GA Lbr Len/2nd Weight Sex Delivery Anes PTL Lv  4 Current           3 Term 10/05/18 [redacted]w[redacted]d 389:29 / 00:16 3090 g F Vag-Spont EPI  LIV     Name: MELLISA, ARSHAD     Apgar1: 8  Apgar5: 9  2 Term 02/21/13   3572 g M Vag-Spont  N LIV  1 Term 10/29/10 [redacted]w[redacted]d 29:01 / 03:53 4105 g M CS-LTranv EPI  LIV     Birth Comments: No dysmophic features; voided on OR table.      Name: JATAYA, WANN     Apgar1: 8  Apgar5: 9    Medications Prior to Admission  Medication Sig Dispense Refill Last Dose  . nitrofurantoin, macrocrystal-monohydrate, (MACROBID) 100 MG capsule Take 100 mg by mouth 2 (two) times daily.   08/01/2019 at Unknown time  . Prenat w/o A-FeCbGl-DSS-FA-DHA (CITRANATAL DHA PO) Take 1 tablet by mouth daily.   08/01/2019 at Unknown time  . cyclobenzaprine (FLEXERIL) 5 MG tablet Take 0.5 tablets (2.5 mg total) by mouth 3 (three) times daily as needed for muscle spasms. (Patient not taking: Reported on 07/23/2019) 5 tablet 0   . hydrocortisone (ANUSOL-HC) 25 MG suppository Place 1 suppository (25 mg total) rectally every 12 (twelve) hours. (Patient not taking: Reported on 07/08/2019) 12 suppository 1      OBJECTIVE  Nursing Evaluation:   BP (!) 112/55 (BP Location: Right Arm)   Pulse 87   Temp 98.2 F (36.8 C) (Oral)   Resp 18   Ht 5' (1.524 m)   Wt 77.1 kg   BMI 33.20 kg/m    Findings:   Fetal movement present ,pelvic pain in pregnancy.vaginal discharge pregnancy.        NST was performed and has been reviewed by me.  NST INTERPRETATION: Category I  Mode: External Baseline Rate (A):  145 bpm Variability: Moderate Accelerations: 10 x 10 Decelerations: None     Contraction Frequency (min): occasional  ASSESSMENT Impression:  1.  Pregnancy:  07/10/2019 at [redacted]w[redacted]d , EDD Estimated Date of Delivery: 10/10/19 2.  Reassuring fetal and maternal status 3.  Fetal movement present  PLAN 1. Discussed current condition and above findings with patient and reassurance given.  All questions answered. Encouraged use of belly band. Discussed referral to pelvic flor PT if needed. Will follow up at next appointment. 2. Discharge home with standard labor precautions given to return to L&D or call the office for problems. 3. Continue routine prenatal care.  10/12/19, CNM

## 2019-08-01 NOTE — OB Triage Note (Addendum)
Patient came in for observation for hip pain and green vaginal that states she thinks is her mucous plug with no foul odor, itching, irritation since 0900. Patient denies uterine contractions and denies vaginal bleeding. Patient complains of green discharge. Patient reports decreased fetal movement today. Vital signs stable and patient afebrile. FHR baseline 145 with moderate variability with accelerations 15 x 15 and no deceleration. Patient in room by herself. Monitors applied and tracing.

## 2019-08-05 ENCOUNTER — Encounter: Payer: Medicaid Other | Admitting: Certified Nurse Midwife

## 2019-08-05 ENCOUNTER — Other Ambulatory Visit: Payer: Medicaid Other

## 2019-08-05 ENCOUNTER — Telehealth: Payer: Self-pay | Admitting: Certified Nurse Midwife

## 2019-08-05 NOTE — Telephone Encounter (Signed)
Noticed patient didn't show up for her 3 hour gtt and her ob appointment today, called patient to reschedule. Left message for her to call the office.

## 2019-08-19 ENCOUNTER — Other Ambulatory Visit: Payer: Self-pay

## 2019-08-19 ENCOUNTER — Ambulatory Visit (INDEPENDENT_AMBULATORY_CARE_PROVIDER_SITE_OTHER): Payer: Medicaid Other | Admitting: Certified Nurse Midwife

## 2019-08-19 ENCOUNTER — Encounter: Payer: Self-pay | Admitting: Certified Nurse Midwife

## 2019-08-19 ENCOUNTER — Other Ambulatory Visit (HOSPITAL_COMMUNITY)
Admission: RE | Admit: 2019-08-19 | Discharge: 2019-08-19 | Disposition: A | Discharge status: Home / Self Care | Payer: Medicaid Other | Source: Ambulatory Visit | Attending: Certified Nurse Midwife | Admitting: Certified Nurse Midwife

## 2019-08-19 VITALS — BP 111/64 | HR 79 | Wt 171.0 lb

## 2019-08-19 DIAGNOSIS — Z3483 Encounter for supervision of other normal pregnancy, third trimester: Secondary | ICD-10-CM

## 2019-08-19 DIAGNOSIS — O09899 Supervision of other high risk pregnancies, unspecified trimester: Secondary | ICD-10-CM | POA: Insufficient documentation

## 2019-08-19 DIAGNOSIS — Z3A32 32 weeks gestation of pregnancy: Secondary | ICD-10-CM | POA: Insufficient documentation

## 2019-08-19 DIAGNOSIS — O26893 Other specified pregnancy related conditions, third trimester: Secondary | ICD-10-CM | POA: Diagnosis present

## 2019-08-19 DIAGNOSIS — O34219 Maternal care for unspecified type scar from previous cesarean delivery: Secondary | ICD-10-CM | POA: Insufficient documentation

## 2019-08-19 DIAGNOSIS — N898 Other specified noninflammatory disorders of vagina: Secondary | ICD-10-CM | POA: Diagnosis present

## 2019-08-19 LAB — POCT URINALYSIS DIPSTICK OB
Blood, UA: NEGATIVE
Glucose, UA: NEGATIVE
Ketones, UA: NEGATIVE
Nitrite, UA: NEGATIVE
POC,PROTEIN,UA: NEGATIVE
Spec Grav, UA: 1.02 (ref 1.010–1.025)
Urobilinogen, UA: 0.2 E.U./dL
pH, UA: 7 (ref 5.0–8.0)

## 2019-08-19 MED ORDER — HYDROXYZINE HCL 25 MG PO TABS
25.0000 mg | ORAL_TABLET | Freq: Four times a day (QID) | ORAL | 2 refills | Status: DC | PRN
Start: 2019-08-19 — End: 2019-10-08

## 2019-08-19 NOTE — Patient Instructions (Addendum)
Preventing Preterm Birth Preterm birth is when your baby is delivered between 20 weeks and 37 weeks of pregnancy. A full-term pregnancy lasts for at least 37 weeks. Preterm birth can be dangerous for your baby because the last few weeks of pregnancy are an important time for your baby's brain and lungs to grow. Many things can cause a baby to be born early. Sometimes the cause is not known. There are certain factors that make you more likely to experience preterm birth, such as:  Having a previous baby born preterm.  Being pregnant with twins or other multiples.  Having had fertility treatment.  Being overweight or underweight at the start of your pregnancy.  Having any of the following during pregnancy: ? An infection, including a urinary tract infection (UTI) or an STI (sexually transmitted infection). ? High blood pressure. ? Diabetes. ? Vaginal bleeding.  Being age 35 or older.  Being age 18 or younger.  Getting pregnant within 6 months of a previous pregnancy.  Suffering extreme stress or physical or emotional abuse during pregnancy.  Standing for long periods of time during pregnancy, such as working at a job that requires standing. What are the risks? The most serious risk of preterm birth is that the baby may not survive. This is more likely to happen if a baby is born before 34 weeks. Other risks and complications of preterm birth may include your baby having:  Breathing problems.  Brain damage that affects movement and coordination (cerebral palsy).  Feeding difficulties.  Vision or hearing problems.  Infections or inflammation of the digestive tract (colitis).  Developmental delays.  Learning disabilities.  Higher risk for diabetes, heart disease, and high blood pressure later in life. What can I do to lower my risk?  Medical care The most important thing you can do to lower your risk for preterm birth is to get routine medical care during pregnancy (prenatal  care). If you have a high risk of preterm birth, you may be referred to a health care provider who specializes in managing high-risk pregnancies (perinatologist). You may be given medicine to help prevent preterm birth. Lifestyle changes Certain lifestyle changes can also lower your risk of preterm birth:  Wait at least 6 months after a pregnancy to become pregnant again.  Try to plan pregnancy for when you are between 19 and 35 years old.  Get to a healthy weight before getting pregnant. If you are overweight, work with your health care provider to safely lose weight.  Do not use any products that contain nicotine or tobacco, such as cigarettes and e-cigarettes. If you need help quitting, ask your health care provider.  Do not drink alcohol.  Do not use drugs. Where to find support For more support, consider:  Talking with your health care provider.  Talking with a therapist or substance abuse counselor, if you need help quitting.  Working with a diet and nutrition specialist (dietitian) or a personal trainer to maintain a healthy weight.  Joining a support group. Where to find more information Learn more about preventing preterm birth from:  Centers for Disease Control and Prevention: cdc.gov/reproductivehealth/maternalinfanthealth/pretermbirth.htm  March of Dimes: marchofdimes.org/complications/premature-babies.aspx  American Pregnancy Association: americanpregnancy.org/labor-and-birth/premature-labor Contact a health care provider if:  You have any of the following signs of preterm labor before 37 weeks: ? A change or increase in vaginal discharge. ? Fluid leaking from your vagina. ? Pressure or cramps in your lower abdomen. ? A backache that does not go away or gets worse. ?   Regular tightening (contractions) in your lower abdomen. Summary  Preterm birth means having your baby during weeks 20-37 of pregnancy.  Preterm birth may put your baby at risk for physical and  mental problems.  Getting good prenatal care can help prevent preterm birth.  You can lower your risk of preterm birth by making certain lifestyle changes, such as not smoking and not using alcohol. This information is not intended to replace advice given to you by your health care provider. Make sure you discuss any questions you have with your health care provider. Document Revised: 12/16/2016 Document Reviewed: 09/12/2015 Elsevier Patient Education  2020 Elsevier Inc.    Fetal Movement Counts Patient Name: ________________________________________________ Patient Due Date: ____________________ What is a fetal movement count?  A fetal movement count is the number of times that you feel your baby move during a certain amount of time. This may also be called a fetal kick count. A fetal movement count is recommended for every pregnant woman. You may be asked to start counting fetal movements as early as week 28 of your pregnancy. Pay attention to when your baby is most active. You may notice your baby's sleep and wake cycles. You may also notice things that make your baby move more. You should do a fetal movement count:  When your baby is normally most active.  At the same time each day. A good time to count movements is while you are resting, after having something to eat and drink. How do I count fetal movements? 1. Find a quiet, comfortable area. Sit, or lie down on your side. 2. Write down the date, the start time and stop time, and the number of movements that you felt between those two times. Take this information with you to your health care visits. 3. Write down your start time when you feel the first movement. 4. Count kicks, flutters, swishes, rolls, and jabs. You should feel at least 10 movements. 5. You may stop counting after you have felt 10 movements, or if you have been counting for 2 hours. Write down the stop time. 6. If you do not feel 10 movements in 2 hours, contact  your health care provider for further instructions. Your health care provider may want to do additional tests to assess your baby's well-being. Contact a health care provider if:  You feel fewer than 10 movements in 2 hours.  Your baby is not moving like he or she usually does. Date: ____________ Start time: ____________ Stop time: ____________ Movements: ____________ Date: ____________ Start time: ____________ Stop time: ____________ Movements: ____________ Date: ____________ Start time: ____________ Stop time: ____________ Movements: ____________ Date: ____________ Start time: ____________ Stop time: ____________ Movements: ____________ Date: ____________ Start time: ____________ Stop time: ____________ Movements: ____________ Date: ____________ Start time: ____________ Stop time: ____________ Movements: ____________ Date: ____________ Start time: ____________ Stop time: ____________ Movements: ____________ Date: ____________ Start time: ____________ Stop time: ____________ Movements: ____________ Date: ____________ Start time: ____________ Stop time: ____________ Movements: ____________ This information is not intended to replace advice given to you by your health care provider. Make sure you discuss any questions you have with your health care provider. Document Revised: 08/23/2018 Document Reviewed: 08/23/2018 Elsevier Patient Education  2020 [employer] Inc.  Hydroxyzine capsules or tablets What is this medicine? HYDROXYZINE (hye DROX i zeen) is an antihistamine. This medicine is used to treat allergy symptoms. It is also used to treat anxiety and tension. This medicine can be used with other medicines to induce sleep before  surgery. This medicine may be used for other purposes; ask your health care provider or pharmacist if you have questions. COMMON BRAND NAME(S): ANX, Atarax, Rezine, Vistaril What should I tell my health care provider before I take this medicine? They need to know if  you have any of these conditions:  glaucoma  heart disease  history of irregular heartbeat  kidney disease  liver disease  lung or breathing disease, like asthma  stomach or intestine problems  thyroid disease  trouble passing urine  an unusual or allergic reaction to hydroxyzine, cetirizine, other medicines, foods, dyes or preservatives  pregnant or trying to get pregnant  breast-feeding How should I use this medicine? Take this medicine by mouth with a full glass of water. Follow the directions on the prescription label. You may take this medicine with food or on an empty stomach. Take your medicine at regular intervals. Do not take your medicine more often than directed. Talk to your pediatrician regarding the use of this medicine in children. Special care may be needed. While this drug may be prescribed for children as young as 276 years of age for selected conditions, precautions do apply. Patients over 29 years old may have a stronger reaction and need a smaller dose. Overdosage: If you think you have taken too much of this medicine contact a poison control center or emergency room at once. NOTE: This medicine is only for you. Do not share this medicine with others. What if I miss a dose? If you miss a dose, take it as soon as you can. If it is almost time for your next dose, take only that dose. Do not take double or extra doses. What may interact with this medicine? Do not take this medicine with any of the following medications:  cisapride  dronedarone  pimozide  thioridazine This medicine may also interact with the following medications:  alcohol  antihistamines for allergy, cough, and cold  atropine  barbiturate medicines for sleep or seizures, like phenobarbital  certain antibiotics like erythromycin or clarithromycin  certain medicines for anxiety or sleep  certain medicines for bladder problems like oxybutynin, tolterodine  certain medicines for  depression or psychotic disturbances  certain medicines for irregular heart beat  certain medicines for Parkinson's disease like benztropine, trihexyphenidyl  certain medicines for seizures like phenobarbital, primidone  certain medicines for stomach problems like dicyclomine, hyoscyamine  certain medicines for travel sickness like scopolamine  ipratropium  narcotic medicines for pain  other medicines that prolong the QT interval (an abnormal heart rhythm) like dofetilide This list may not describe all possible interactions. Give your health care provider a list of all the medicines, herbs, non-prescription drugs, or dietary supplements you use. Also tell them if you smoke, drink alcohol, or use illegal drugs. Some items may interact with your medicine. What should I watch for while using this medicine? Tell your doctor or health care professional if your symptoms do not improve. You may get drowsy or dizzy. Do not drive, use machinery, or do anything that needs mental alertness until you know how this medicine affects you. Do not stand or sit up quickly, especially if you are an older patient. This reduces the risk of dizzy or fainting spells. Alcohol may interfere with the effect of this medicine. Avoid alcoholic drinks. Your mouth may get dry. Chewing sugarless gum or sucking hard candy, and drinking plenty of water may help. Contact your doctor if the problem does not go away or is severe. This medicine  may cause dry eyes and blurred vision. If you wear contact lenses you may feel some discomfort. Lubricating drops may help. See your eye doctor if the problem does not go away or is severe. If you are receiving skin tests for allergies, tell your doctor you are using this medicine. What side effects may I notice from receiving this medicine? Side effects that you should report to your doctor or health care professional as soon as possible:  allergic reactions like skin rash, itching or  hives, swelling of the face, lips, or tongue  changes in vision  confusion  fast, irregular heartbeat  seizures  tremor  trouble passing urine or change in the amount of urine Side effects that usually do not require medical attention (report to your doctor or health care professional if they continue or are bothersome):  constipation  drowsiness  dry mouth  headache  tiredness This list may not describe all possible side effects. Call your doctor for medical advice about side effects. You may report side effects to FDA at 1-800-FDA-1088. Where should I keep my medicine? Keep out of the reach of children. Store at room temperature between 15 and 30 degrees C (59 and 86 degrees F). Keep container tightly closed. Throw away any unused medicine after the expiration date. NOTE: This sheet is a summary. It may not cover all possible information. If you have questions about this medicine, talk to your doctor, pharmacist, or health care provider.  2020 Elsevier/Gold Standard (2017-12-25 13:19:55)   Aripiprazole tablets What is this medicine? ARIPIPRAZOLE (ay ri PIP ray zole) is an atypical antipsychotic. It is used to treat schizophrenia and bipolar disorder, also known as manic-depression. It is also used to treat Tourette's disorder and some symptoms of autism. This medicine may also be used in combination with antidepressants to treat major depressive disorder. This medicine may be used for other purposes; ask your health care provider or pharmacist if you have questions. COMMON BRAND NAME(S): Abilify What should I tell my health care provider before I take this medicine? They need to know if you have any of these conditions:  dehydration  dementia  diabetes  heart disease  history of stroke  low blood counts, like low white cell, platelet, or red cell counts  Parkinson's disease  seizures  suicidal thoughts, plans, or attempt; a previous suicide attempt by you or  a family member  an unusual or allergic reaction to aripiprazole, other medicines, foods, dyes, or preservatives  pregnant or trying to get pregnant  breast-feeding How should I use this medicine? Take this medicine by mouth with a glass of water. Follow the directions on the prescription label. You can take this medicine with or without food. Take your doses at regular intervals. Do not take your medicine more often than directed. Do not stop taking except on the advice of your doctor or health care professional. A special MedGuide will be given to you by the pharmacist with each prescription and refill. Be sure to read this information carefully each time. Talk to your pediatrician regarding the use of this medicine in children. While this drug may be prescribed for children as young as 79 years of age for selected conditions, precautions do apply. Overdosage: If you think you have taken too much of this medicine contact a poison control center or emergency room at once. NOTE: This medicine is only for you. Do not share this medicine with others. What if I miss a dose? If you miss a  dose, take it as soon as you can. If it is almost time for your next dose, take only that dose. Do not take double or extra doses. What may interact with this medicine? Do not take this medicine with any of the following medications:  brexpiprazole  cisapride  dronedarone  metoclopramide  pimozide  thioridazine This medicine may also interact with the following medications:  alcohol  carbamazepine  certain medicines for anxiety or sleep  certain medicines for blood pressure  certain medicines for fungal infections like ketoconazole, fluconazole, posaconazole, and itraconazole  clarithromycin  dofetilide  fluoxetine  other medicines that prolong the QT interval (cause an abnormal heart rhythm)  paroxetine  quinidine  rifampin  ziprasidone This list may not describe all possible  interactions. Give your health care provider a list of all the medicines, herbs, non-prescription drugs, or dietary supplements you use. Also tell them if you smoke, drink alcohol, or use illegal drugs. Some items may interact with your medicine. What should I watch for while using this medicine? Visit your health care professional for regular checks on your progress. Tell your health care professional if symptoms do not start to get better or if they get worse. Do not stop taking except on your health care professional's advice. You may develop a severe reaction. Your health care professional will tell you how much medicine to take. Patients and their families should watch out for new or worsening depression or thoughts of suicide. Also watch out for sudden changes in feelings such as feeling anxious, agitated, panicky, irritable, hostile, aggressive, impulsive, severely restless, overly excited and hyperactive, or not being able to sleep. If this happens, especially at the beginning of antidepressant treatment or after a change in dose, call your health care professional. Bonita Quin may get dizzy or drowsy. Do not drive, use machinery, or do anything that needs mental alertness until you know how this medicine affects you. Do not stand or sit up quickly, especially if you are an older patient. This reduces the risk of dizzy or fainting spells. Alcohol may interfere with the effect of this medicine. Avoid alcoholic drinks. This drug can cause problems with controlling your body temperature. It can lower the response of your body to cold temperatures. If possible, stay indoors during cold weather. If you must go outdoors, wear warm clothes. It can also lower the response of your body to heat. Do not overheat. Do not over-exercise. Stay out of the sun when possible. If you must be in the sun, wear cool clothing. Drink plenty of water. If you have trouble controlling your body temperature, call your health care provider  right away. This medicine may cause dry eyes and blurred vision. If you wear contact lenses, you may feel some discomfort. Lubricating drops may help. See your eye doctor if the problem does not go away or is severe. This medicine may increase blood sugar. Ask your health care provider if changes in diet or medicines are needed if you have diabetes. There are have been reports of increased sexual urges or other strong urges such as gambling while taking this medicine. If you experience any of these while taking this medicine, you should report this to your health care professional as soon as possible. What side effects may I notice from receiving this medicine? Side effects that you should report to your doctor or health care professional as soon as possible:  allergic reactions like skin rash, itching or hives, swelling of the face, lips, or tongue  breathing problems  confusion  fast, irregular heartbeat  fever or chills, sore throat  inability to keep still  males: prolonged or painful erection  new or increased gambling urges, sexual urges, uncontrolled spending, binge or compulsive eating, or other urges  problems with balance, talking, walking  seizures  signs and symptoms of high blood sugar such as being more thirsty or hungry or having to urinate more than normal. You may also feel very tired or have blurry vision  signs and symptoms of low blood pressure like dizziness; feeling faint or lightheaded, falls; unusually weak or tired  signs and symptoms of neuroleptic malignant syndrome like confusion; fast or irregular heartbeat; high fever; increased sweating; stiff muscles  sudden numbness or weakness of the face, arm, or leg  suicidal thoughts or other mood changes  trouble swallowing  uncontrollable movements of the arms, face, head, mouth, neck, or upper body Side effects that usually do not require medical attention (report to your doctor or health care  professional if they continue or are bothersome):  constipation  headache  nausea, vomiting  trouble sleeping  weight gain This list may not describe all possible side effects. Call your doctor for medical advice about side effects. You may report side effects to FDA at 1-800-FDA-1088. Where should I keep my medicine? Keep out of the reach of children. Store at room temperature between 15 and 30 degrees C (59 and 86 degrees F). Throw away any unused medicine after the expiration date. NOTE: This sheet is a summary. It may not cover all possible information. If you have questions about this medicine, talk to your doctor, pharmacist, or health care provider.  2020 Elsevier/Gold Standard (2018-10-30 16:17:22)

## 2019-08-19 NOTE — Progress Notes (Signed)
ROB-Reports increased vaginal discharge, loss of mucus plug and increased pelvic pressure. FFN and vaginal swab collected, see orders. Multiparous os closed on visual inspection.Teaful due to depressed feeling and lack of motivation, history of bipolar. Information given regarding Abilify. Rx Vistaril, see orders. Encouraged to return for GTT this week. Anticipatory guidance regarding course of prenatal care. Reviewed red flag symptoms and when to call. RTC x 2 weeks for ROB or sooner if needed.    Depression screen PHQ 2/9 08/19/2019  Decreased Interest 2  Down, Depressed, Hopeless 3  PHQ - 2 Score 5  Altered sleeping 2  Tired, decreased energy 1  Change in appetite 3  Feeling bad or failure about yourself  3  Trouble concentrating 2  Moving slowly or fidgety/restless 1  Suicidal thoughts 0  PHQ-9 Score 17  Difficult doing work/chores Very difficult

## 2019-08-20 ENCOUNTER — Other Ambulatory Visit: Payer: Medicaid Other

## 2019-08-20 DIAGNOSIS — O99810 Abnormal glucose complicating pregnancy: Secondary | ICD-10-CM

## 2019-08-21 LAB — GESTATIONAL GLUCOSE TOLERANCE
Glucose, Fasting: 74 mg/dL (ref 65–94)
Glucose, GTT - 1 Hour: 138 mg/dL (ref 65–179)
Glucose, GTT - 2 Hour: 115 mg/dL (ref 65–154)
Glucose, GTT - 3 Hour: 76 mg/dL (ref 65–139)

## 2019-08-21 LAB — FETAL FIBRONECTIN: Fetal Fibronectin: NEGATIVE

## 2019-08-21 LAB — URINE CULTURE

## 2019-08-22 ENCOUNTER — Other Ambulatory Visit: Payer: Self-pay | Admitting: Certified Nurse Midwife

## 2019-08-22 DIAGNOSIS — B3731 Acute candidiasis of vulva and vagina: Secondary | ICD-10-CM

## 2019-08-22 DIAGNOSIS — B373 Candidiasis of vulva and vagina: Secondary | ICD-10-CM

## 2019-08-22 LAB — CERVICOVAGINAL ANCILLARY ONLY
Bacterial Vaginitis (gardnerella): NEGATIVE
Candida Glabrata: POSITIVE — AB
Candida Vaginitis: NEGATIVE
Chlamydia: NEGATIVE
Comment: NEGATIVE
Comment: NEGATIVE
Comment: NEGATIVE
Comment: NEGATIVE
Comment: NEGATIVE
Comment: NORMAL
Neisseria Gonorrhea: NEGATIVE
Trichomonas: NEGATIVE

## 2019-08-22 MED ORDER — TERCONAZOLE 0.4 % VA CREA
1.0000 | TOPICAL_CREAM | Freq: Every day | VAGINAL | 0 refills | Status: DC
Start: 2019-08-22 — End: 2019-09-12

## 2019-08-22 NOTE — Progress Notes (Signed)
Rx Terazol, see orders.    Serafina Royals, CNM [facility], [facility] 08/22/19 12:39 PM

## 2019-09-03 ENCOUNTER — Encounter: Payer: Self-pay | Admitting: Certified Nurse Midwife

## 2019-09-03 ENCOUNTER — Other Ambulatory Visit: Payer: Self-pay

## 2019-09-03 ENCOUNTER — Ambulatory Visit (INDEPENDENT_AMBULATORY_CARE_PROVIDER_SITE_OTHER): Payer: Medicaid Other | Admitting: Certified Nurse Midwife

## 2019-09-03 ENCOUNTER — Other Ambulatory Visit: Payer: Medicaid Other

## 2019-09-03 VITALS — BP 98/65 | HR 86 | Wt 177.0 lb

## 2019-09-03 DIAGNOSIS — Z3483 Encounter for supervision of other normal pregnancy, third trimester: Secondary | ICD-10-CM

## 2019-09-03 DIAGNOSIS — Z3A34 34 weeks gestation of pregnancy: Secondary | ICD-10-CM

## 2019-09-03 LAB — POCT URINALYSIS DIPSTICK OB
Bilirubin, UA: NEGATIVE
Blood, UA: NEGATIVE
Glucose, UA: NEGATIVE
Ketones, UA: NEGATIVE
Leukocytes, UA: NEGATIVE
Nitrite, UA: NEGATIVE
POC,PROTEIN,UA: NEGATIVE
Spec Grav, UA: 1.01 (ref 1.010–1.025)
Urobilinogen, UA: 0.2 E.U./dL
pH, UA: 8 (ref 5.0–8.0)

## 2019-09-03 NOTE — Patient Instructions (Signed)
Group B Streptococcus Infection During Pregnancy °Group B Streptococcus (GBS) is a type of bacteria that is often found in healthy people. It is commonly found in the rectum, vagina, and intestines. In people who are healthy and not pregnant, the bacteria rarely cause serious illness or complications. However, women who test positive for GBS during pregnancy can pass the bacteria to the baby during childbirth. This can cause serious infection in the baby after birth. °Women with GBS may also have infections during their pregnancy or soon after childbirth. The infections include urinary tract infections (UTIs) or infections of the uterus. GBS also increases a woman's risk of complications during pregnancy, such as early labor or delivery, miscarriage, or stillbirth. Routine testing for GBS is recommended for all pregnant women. °What are the causes? °This condition is caused by bacteria called Streptococcus agalactiae. °What increases the risk? °You may have a higher risk for GBS infection during pregnancy if you had one during a past pregnancy. °What are the signs or symptoms? °In most cases, GBS infection does not cause symptoms in pregnant women. If symptoms exist, they may include: °· Labor that starts before the 37th week of pregnancy. °· A UTI or bladder infection. This may cause a fever, frequent urination, or pain and burning during urination. °· Fever during labor. There can also be a rapid heartbeat in the mother or baby. °Rare but serious symptoms of a GBS infection in women include: °· Blood infection (septicemia). This may cause fever, chills, or confusion. °· Lung infection (pneumonia). This may cause fever, chills, cough, rapid breathing, chest pain, or difficulty breathing. °· Bone, joint, skin, or soft tissue infection. °How is this diagnosed? °You may be screened for GBS between week 35 and week 37 of pregnancy. If you have symptoms of preterm labor, you may be screened earlier. This condition is  diagnosed based on lab test results from: °· A swab of fluid from the vagina and rectum. °· A urine sample. °How is this treated? °This condition is treated with antibiotic medicine. Antibiotic medicine may be given: °· To you when you go into labor, or as soon as your water breaks. The medicines will continue until after you give birth. If you are having a cesarean delivery, you do not need antibiotics unless your water has broken. °· To your baby, if he or she requires treatment. Your health care provider will check your baby to decide if he or she needs antibiotics to prevent a serious infection. °Follow these instructions at home: °· Take over-the-counter and prescription medicines only as told by your health care provider. °· Take your antibiotic medicine as told by your health care provider. Do not stop taking the antibiotic even if you start to feel better. °· Keep all pre-birth (prenatal) visits and follow-up visits as told by your health care provider. This is important. °Contact a health care provider if: °· You have pain or burning when you urinate. °· You have to urinate more often than usual. °· You have a fever or chills. °· You develop a bad-smelling vaginal discharge. °Get help right away if: °· Your water breaks. °· You go into labor. °· You have severe pain in your abdomen. °· You have difficulty breathing. °· You have chest pain. °These symptoms may represent a serious problem that is an emergency. Do not wait to see if the symptoms will go away. Get medical help right away. Call your local emergency services (911 in the [place] Do not drive yourself to   the hospital. °Summary °· GBS is a type of bacteria that is common in healthy people. °· During pregnancy, colonization with GBS can cause serious complications for you or your baby. °· Your health care provider will screen you between 35 and 37 weeks of pregnancy to determine if you are colonized with GBS. °· If you are colonized with GBS during  pregnancy, your health care provider will recommend antibiotics through an IV during labor. °· After delivery, your baby will be evaluated for complications related to potential GBS infection and may require antibiotics to prevent a serious infection. °This information is not intended to replace advice given to you by your health care provider. Make sure you discuss any questions you have with your health care provider. °Document Revised: 07/30/2018 Document Reviewed: 07/30/2018 °Elsevier Patient Education © 2020 [employer] Inc. ° °

## 2019-09-03 NOTE — Progress Notes (Signed)
ROB doing well. Feels good movement, IS have pressure. Discussed GBS testing at next appointment she agrees to plan . Follow up 2 wks with Marcelino Duster.   Doreene Burke, CNM

## 2019-09-12 ENCOUNTER — Other Ambulatory Visit: Payer: Self-pay

## 2019-09-12 ENCOUNTER — Encounter: Payer: Self-pay | Admitting: Emergency Medicine

## 2019-09-12 ENCOUNTER — Telehealth: Payer: Self-pay | Admitting: Certified Nurse Midwife

## 2019-09-12 ENCOUNTER — Emergency Department
Admission: EM | Admit: 2019-09-12 | Discharge: 2019-09-12 | Disposition: A | Discharge status: Home / Self Care | Payer: Medicaid Other | Attending: Emergency Medicine | Admitting: Emergency Medicine

## 2019-09-12 DIAGNOSIS — K649 Unspecified hemorrhoids: Secondary | ICD-10-CM | POA: Diagnosis not present

## 2019-09-12 DIAGNOSIS — Z87891 Personal history of nicotine dependence: Secondary | ICD-10-CM | POA: Diagnosis not present

## 2019-09-12 DIAGNOSIS — Z79899 Other long term (current) drug therapy: Secondary | ICD-10-CM | POA: Insufficient documentation

## 2019-09-12 DIAGNOSIS — K6289 Other specified diseases of anus and rectum: Secondary | ICD-10-CM | POA: Diagnosis present

## 2019-09-12 DIAGNOSIS — Z91040 Latex allergy status: Secondary | ICD-10-CM | POA: Insufficient documentation

## 2019-09-12 MED ORDER — DIBUCAINE (PERIANAL) 1 % EX OINT: 1.0000 "application " | TOPICAL_OINTMENT | Freq: Three times a day (TID) | CUTANEOUS | 0 refills | Status: DC | PRN

## 2019-09-12 MED ORDER — LIDOCAINE HCL URETHRAL/MUCOSAL 2 % EX GEL
1.0000 "application " | Freq: Once | CUTANEOUS | Status: AC
  Administered 2019-09-12: 1 via TOPICAL
  Filled 2019-09-12: qty 5

## 2019-09-12 MED ORDER — HYDROCORTISONE ACETATE 25 MG RE SUPP
25.0000 mg | Freq: Once | RECTAL | Status: AC
  Administered 2019-09-12: 25 mg via RECTAL
  Filled 2019-09-12 (×3): qty 1

## 2019-09-12 MED ORDER — HYDROCORTISONE ACETATE 25 MG RE SUPP: 25.0000 mg | Freq: Two times a day (BID) | RECTAL | 0 refills | Status: AC

## 2019-09-12 MED ORDER — LIDOCAINE HCL (PF) 1 % IJ SOLN
5.0000 mL | Freq: Once | INTRAMUSCULAR | Status: AC
  Administered 2019-09-12: 5 mL

## 2019-09-12 NOTE — Discharge Instructions (Addendum)
Use the suppository and pain-relief gel as directed. Follow-up with your OB provider as needed.

## 2019-09-12 NOTE — ED Notes (Signed)
See triage note  Pt is 36 weeks preg and has hemorrhoids.  States she was told by her OB to come to ED for care   Has used Anusol w/o relief

## 2019-09-12 NOTE — Telephone Encounter (Signed)
Patient called in requesting to speak with the nurse, I informed her that she was with other patients and asked if she would be okay with me leaving a message, patient responded with "I guess". She was stating that she is having some problems with hemroids and that she thinks she was having some contractions last night that was causing her a lot of discomfort. I started to ask patient if she had lost her mucus plug or if she was having any vaginal bleeding however she became very irritable and so I informed her that I would send a message to her providers nurse. Patient thinks she needs to go to the hospital. Could you please advise??

## 2019-09-12 NOTE — ED Triage Notes (Signed)
Patient presents to the ED with severe hemorrhoid.  Patient called her OB-GYN prior to arrival and states she was told to come to the ED.  This RN called labor and delivery who stated patient needs to be seen in the ED since patient has no other pregnancy related complaint.  Patient is [redacted] weeks pregnant.

## 2019-09-12 NOTE — ED Provider Notes (Signed)
[facility] Emergency Department Provider Note ____________________________________________  Time seen: 1503  I have reviewed the triage vital signs and the nursing notes.  HISTORY  Chief Complaint  Hemorrhoids  HPI Tammy Snyder is a 29 y.o. female presents at the request of her OB provider for evaluation and management of acute rectal pain due to a hemorrhoid. The patient reports a 1-week complaint of a tense rectal lesion, she has tried unsuccessfully to reduce manually. She denies any rectal bleeding or constipation. She called get OB for advice, and they suggested she report here for treatment. She reported contractions last night for about 3 hours. She notes decreased contraction activity today.   Past Medical History:  Diagnosis Date  . Bipolar 1 disorder (HCC)   . Borderline personality disorder (HCC)   . Cochlear implant in place   . Hearing loss   . Interstitial cystitis   . Mental disorder    Borderline personalities disorder  . Oliguria 2013   cystoscopy, hydrodilation under anesthesia  . Polysubstance abuse (HCC)   . Vaginal Pap smear, abnormal     Patient Active Problem List   Diagnosis Date Noted  . Labor and delivery, indication for care 07/04/2019  . Abdominal trauma   . Hypokalemia 05/06/2019  . Short interval between pregnancies affecting pregnancy, antepartum 05/06/2019  . Back pain affecting pregnancy in second trimester 05/06/2019  . Pregnancy related hip pain in second trimester, antepartum 05/06/2019  . Pregnancy related fatigue in second trimester 05/06/2019  . Acute pyelonephritis 04/28/2019  . Vaginal birth after cesarean 10/05/2018  . History of hypokalemia 09/13/2018  . Pregnancy 08/03/2018  . Palpitations 07/04/2018  . Orthostatic lightheadedness 07/04/2018  . Syncope and collapse 07/03/2018  . History of chlamydia 07/02/2018  . Desires VBAC (vaginal birth after cesarean) trial 07/02/2018  . Hearing loss  03/06/2018  . History of cesarean delivery 03/06/2018  . History of VBAC 03/06/2018    Past Surgical History:  Procedure Laterality Date  . Calibration of urethra with dilation of the urethra under anesthesia  04/04/2011   Dr. Rosezetta Schlatter  . CESAREAN SECTION  10/29/2010   Procedure: CESAREAN SECTION;  Surgeon: Lazaro Arms, MD;  Location: [facility];  Service: Gynecology;;  . CESAREAN SECTION    . COCHLEAR IMPLANT    . COCHLEAR IMPLANT    . cystoscopy, hydrodilation under anesthesia  04/04/2011    Prior to Admission medications   Medication Sig Start Date End Date Taking? Authorizing Provider  dibucaine (NUPERCAINAL) 1 % OINT Place 1 application rectally 3 (three) times daily as needed. 09/12/19   Munachimso Palin, Charlesetta Ivory, PA-C  hydrocortisone (ANUSOL-HC) 25 MG suppository Place 1 suppository (25 mg total) rectally every 12 (twelve) hours for 6 days. 09/12/19 09/18/19  Kjerstin Abrigo, Charlesetta Ivory, PA-C  hydrOXYzine (ATARAX/VISTARIL) 25 MG tablet Take 1 tablet (25 mg total) by mouth every 6 (six) hours as needed for anxiety. 08/19/19   Lawhorn, Vanessa [place], CNM  nitrofurantoin, macrocrystal-monohydrate, (MACROBID) 100 MG capsule Take 100 mg by mouth 2 (two) times daily.    [provider]  Prenat w/o A-FeCbGl-DSS-FA-DHA (CITRANATAL DHA PO) Take 1 tablet by mouth daily.    [provider]    Allergies Latex, Nsaids, and Tylenol [acetaminophen]  Family History  Problem Relation Age of Onset  . Hypertension Father   . Stroke Father   . Hypertension Mother   . Lung cancer Maternal Grandfather   . Syncope episode Paternal Grandmother     Social  History Social History   Tobacco Use  . Smoking status: Former Smoker    Packs/day: 1.00    Years: 8.00    Pack years: 8.00    Types: Cigarettes    Quit date: 01/02/2018    Years since quitting: 1.6  . Smokeless tobacco: Never Used  Vaping Use  . Vaping Use: Never used  Substance Use Topics  . Alcohol use: Not Currently     Alcohol/week: 0.0 standard drinks  . Drug use: No    Review of Systems  Constitutional: Negative for fever. Cardiovascular: Negative for chest pain. Respiratory: Negative for shortness of breath. Gastrointestinal: Negative for abdominal pain, vomiting and diarrhea. Rectal pain as above Genitourinary: Negative for dysuria. No vaginal bleeding.  Musculoskeletal: Negative for back pain. Skin: Negative for rash. Neurological: Negative for headaches, focal weakness or numbness. ____________________________________________  PHYSICAL EXAM:  VITAL SIGNS: ED Triage Vitals  Enc Vitals Group     BP 09/12/19 1404 110/66     Pulse Rate 09/12/19 1404 (!) 103     Resp 09/12/19 1404 20     Temp 09/12/19 1404 98.8 F (37.1 C)     Temp Source 09/12/19 1404 Oral     SpO2 09/12/19 1404 95 %     Weight 09/12/19 1405 178 lb (80.7 kg)     Height 09/12/19 1405 5\' 3"  (1.6 m)     Head Circumference --      Peak Flow --      Pain Score 09/12/19 1405 8     Pain Loc --      Pain Edu? --      Excl. in GC? --     Constitutional: Alert and oriented. Well appearing and in no distress. Head: Normocephalic and atraumatic. Eyes: Conjunctivae are normal. Normal extraocular movements Cardiovascular: Normal rate, regular rhythm. Normal distal pulses. Respiratory: Normal respiratory effort.  Gastrointestinal: Soft, gravid, and nontender. No distention. Single, tense, thrombosed external hemorrhoid at the 5 o'clock position.  Musculoskeletal: Nontender with normal range of motion in all extremities.  Neurologic:  Normal gait without ataxia. Normal speech and language. No gross focal neurologic deficits are appreciated. Skin:  Skin is warm, dry and intact. No rash noted. Psychiatric: Mood and affect are normal. Patient exhibits appropriate insight and judgment. ____________________________________________  PROCEDURES  .08/28/21Incision and Drainage  Date/Time: 09/12/2019 4:30 PM Performed by: 09/14/2019, PA-C Authorized by: Lissa Hoard, PA-C   Consent:    Consent obtained:  Verbal   Consent given by:  Patient   Risks discussed:  Bleeding and pain   Alternatives discussed:  Alternative treatment Location:    Size:  Thrombosed hemorrhoid   Location:  Anogenital   Anogenital location:  Perirectal Anesthesia (see MAR for exact dosages):    Anesthesia method:  Local infiltration and topical application   Topical anesthetic:  Lidocaine gel   Local anesthetic:  Lidocaine 1% w/o epi Procedure type:    Complexity:  Simple Procedure details:    Incision types:  Single straight   Incision depth:  Subcutaneous   Scalpel blade:  11   Wound management:  Irrigated with saline   Drainage:  Bloody   Drainage amount:  Scant   Wound treatment:  Wound left open   Packing materials:  None Post-procedure details:    Patient tolerance of procedure:  Tolerated well, no immediate complications   ____________________________________________  INITIAL IMPRESSION / ASSESSMENT AND PLAN / ED COURSE  ----------------------------------------- 3:32 PM on 09/12/2019 -----------------------------------------  S/w Doreene Burke, CNM: she is aware the patient is in the ED. She suggests the patient follow-up with her in the office next week. She is agreeable to the plan of local anesthesia/I&D.  Antepartum patient with ED evaluation of a 1 -week complaint of rectal pain due to hemorrhoids. The patient is amenable to the plan of I&D with insertion of a hydrocortisone suppository. She will be discharged with the same, along with a sitz bath pan. Patient will increase fiber intake and follow-up with OB as planned. Return precautions are reviewed.   Catheline Shamel Galyean was evaluated in Emergency Department on 09/14/2019 for the symptoms described in the history of present illness. She was evaluated in the context of the global COVID-19 pandemic, which necessitated consideration that the  patient might be at risk for infection with the SARS-CoV-2 virus that causes COVID-19. Institutional protocols and algorithms that pertain to the evaluation of patients at risk for COVID-19 are in a state of rapid change based on information released by regulatory bodies including the CDC and federal and state organizations. These policies and algorithms were followed during the patient's care in the ED. ____________________________________________  FINAL CLINICAL IMPRESSION(S) / ED DIAGNOSES  Final diagnoses:  Hemorrhoids, unspecified hemorrhoid type      Karmen Stabs, Charlesetta Ivory, PA-C 09/14/19 2119    Arnaldo Natal, MD 09/16/19 1630

## 2019-09-13 ENCOUNTER — Telehealth: Payer: Self-pay

## 2019-09-13 NOTE — Telephone Encounter (Signed)
mychart message sent to patient

## 2019-09-16 ENCOUNTER — Ambulatory Visit (INDEPENDENT_AMBULATORY_CARE_PROVIDER_SITE_OTHER): Payer: Medicaid Other | Admitting: Certified Nurse Midwife

## 2019-09-16 ENCOUNTER — Other Ambulatory Visit: Payer: Self-pay

## 2019-09-16 ENCOUNTER — Encounter: Payer: Self-pay | Admitting: Certified Nurse Midwife

## 2019-09-16 ENCOUNTER — Telehealth: Payer: Self-pay

## 2019-09-16 VITALS — BP 99/71 | HR 92 | Wt 179.5 lb

## 2019-09-16 DIAGNOSIS — Z113 Encounter for screening for infections with a predominantly sexual mode of transmission: Secondary | ICD-10-CM

## 2019-09-16 DIAGNOSIS — Z3A36 36 weeks gestation of pregnancy: Secondary | ICD-10-CM | POA: Diagnosis not present

## 2019-09-16 DIAGNOSIS — Z3483 Encounter for supervision of other normal pregnancy, third trimester: Secondary | ICD-10-CM

## 2019-09-16 DIAGNOSIS — Z3685 Encounter for antenatal screening for Streptococcus B: Secondary | ICD-10-CM

## 2019-09-16 DIAGNOSIS — O34219 Maternal care for unspecified type scar from previous cesarean delivery: Secondary | ICD-10-CM

## 2019-09-16 LAB — POCT URINALYSIS DIPSTICK OB
Bilirubin, UA: NEGATIVE
Blood, UA: NEGATIVE
Glucose, UA: NEGATIVE
Ketones, UA: NEGATIVE
Leukocytes, UA: NEGATIVE
Nitrite, UA: NEGATIVE
POC,PROTEIN,UA: NEGATIVE
Spec Grav, UA: 1.01 (ref 1.010–1.025)
Urobilinogen, UA: 0.2 E.U./dL
pH, UA: 7.5 (ref 5.0–8.0)

## 2019-09-16 NOTE — Telephone Encounter (Signed)
mychart message sent to patient

## 2019-09-16 NOTE — Patient Instructions (Signed)
Fetal Movement Counts Patient Name: ________________________________________________ Patient Due Date: ____________________ What is a fetal movement count?  A fetal movement count is the number of times that you feel your baby move during a certain amount of time. This may also be called a fetal kick count. A fetal movement count is recommended for every pregnant woman. You may be asked to start counting fetal movements as early as week 28 of your pregnancy. Pay attention to when your baby is most active. You may notice your baby's sleep and wake cycles. You may also notice things that make your baby move more. You should do a fetal movement count:  When your baby is normally most active.  At the same time each day. A good time to count movements is while you are resting, after having something to eat and drink. How do I count fetal movements? 1. Find a quiet, comfortable area. Sit, or lie down on your side. 2. Write down the date, the start time and stop time, and the number of movements that you felt between those two times. Take this information with you to your health care visits. 3. Write down your start time when you feel the first movement. 4. Count kicks, flutters, swishes, rolls, and jabs. You should feel at least 10 movements. 5. You may stop counting after you have felt 10 movements, or if you have been counting for 2 hours. Write down the stop time. 6. If you do not feel 10 movements in 2 hours, contact your health care provider for further instructions. Your health care provider may want to do additional tests to assess your baby's well-being. Contact a health care provider if:  You feel fewer than 10 movements in 2 hours.  Your baby is not moving like he or she usually does. Date: ____________ Start time: ____________ Stop time: ____________ Movements: ____________ Date: ____________ Start time: ____________ Stop time: ____________ Movements: ____________ Date: ____________  Start time: ____________ Stop time: ____________ Movements: ____________ Date: ____________ Start time: ____________ Stop time: ____________ Movements: ____________ Date: ____________ Start time: ____________ Stop time: ____________ Movements: ____________ Date: ____________ Start time: ____________ Stop time: ____________ Movements: ____________ Date: ____________ Start time: ____________ Stop time: ____________ Movements: ____________ Date: ____________ Start time: ____________ Stop time: ____________ Movements: ____________ Date: ____________ Start time: ____________ Stop time: ____________ Movements: ____________ This information is not intended to replace advice given to you by your health care provider. Make sure you discuss any questions you have with your health care provider. Document Revised: 08/23/2018 Document Reviewed: 08/23/2018 Elsevier Patient Education  2020 Elsevier Inc.  

## 2019-09-18 ENCOUNTER — Telehealth: Payer: Self-pay

## 2019-09-18 LAB — STREP GP B NAA: Strep Gp B NAA: NEGATIVE

## 2019-09-18 LAB — GC/CHLAMYDIA PROBE AMP
Chlamydia trachomatis, NAA: NEGATIVE
Neisseria Gonorrhoeae by PCR: NEGATIVE

## 2019-09-18 NOTE — Telephone Encounter (Signed)
mychart message sent to patient

## 2019-09-19 NOTE — Progress Notes (Signed)
ROB-Reports hemorrhoid pain and irregular contractions. Discussed home treatment measures. 36 week cultures collected, see orders. Anticipatory guidance regarding course of prenatal care. Reviewed red flag symptoms and when to call. RTC x 1 week for ROB or sooner if needed.

## 2019-09-24 ENCOUNTER — Telehealth: Payer: Self-pay

## 2019-09-24 NOTE — Telephone Encounter (Signed)
mychart message sent to patient

## 2019-09-25 ENCOUNTER — Encounter: Payer: Self-pay | Admitting: Certified Nurse Midwife

## 2019-09-25 ENCOUNTER — Other Ambulatory Visit: Payer: Self-pay

## 2019-09-25 ENCOUNTER — Ambulatory Visit (INDEPENDENT_AMBULATORY_CARE_PROVIDER_SITE_OTHER): Payer: Medicaid Other | Admitting: Certified Nurse Midwife

## 2019-09-25 VITALS — BP 112/67 | HR 88 | Wt 177.8 lb

## 2019-09-25 DIAGNOSIS — Z3A37 37 weeks gestation of pregnancy: Secondary | ICD-10-CM

## 2019-09-25 DIAGNOSIS — Z3483 Encounter for supervision of other normal pregnancy, third trimester: Secondary | ICD-10-CM

## 2019-09-25 LAB — POCT URINALYSIS DIPSTICK OB
Bilirubin, UA: NEGATIVE
Blood, UA: NEGATIVE
Glucose, UA: NEGATIVE
Ketones, UA: NEGATIVE
Leukocytes, UA: NEGATIVE
Nitrite, UA: NEGATIVE
POC,PROTEIN,UA: NEGATIVE
Spec Grav, UA: 1.015 (ref 1.010–1.025)
Urobilinogen, UA: 0.2 E.U./dL
pH, UA: 7 (ref 5.0–8.0)

## 2019-09-25 NOTE — Progress Notes (Signed)
ROB doing well. Feels good movement. Labor precautions reviewed. SVE per pt request 4/70/-3. Follow up 1 wk with Marcelino Duster.   Doreene Burke, CNM

## 2019-09-25 NOTE — Patient Instructions (Signed)
Braxton Hicks Contractions °Contractions of the uterus can occur throughout pregnancy, but they are not always a sign that you are in labor. You may have practice contractions called Braxton Hicks contractions. These false labor contractions are sometimes confused with true labor. °What are Braxton Hicks contractions? °Braxton Hicks contractions are tightening movements that occur in the muscles of the uterus before labor. Unlike true labor contractions, these contractions do not result in opening (dilation) and thinning of the cervix. Toward the end of pregnancy (32-34 weeks), Braxton Hicks contractions can happen more often and may become stronger. These contractions are sometimes difficult to tell apart from true labor because they can be very uncomfortable. You should not feel embarrassed if you go to the hospital with false labor. °Sometimes, the only way to tell if you are in true labor is for your health care provider to look for changes in the cervix. The health care provider will do a physical exam and may monitor your contractions. If you are not in true labor, the exam should show that your cervix is not dilating and your water has not broken. °If there are no other health problems associated with your pregnancy, it is completely safe for you to be sent home with false labor. You may continue to have Braxton Hicks contractions until you go into true labor. °How to tell the difference between true labor and false labor °True labor °· Contractions last 30-70 seconds. °· Contractions become very regular. °· Discomfort is usually felt in the top of the uterus, and it spreads to the lower abdomen and low back. °· Contractions do not go away with walking. °· Contractions usually become more intense and increase in frequency. °· The cervix dilates and gets thinner. °False labor °· Contractions are usually shorter and not as strong as true labor contractions. °· Contractions are usually irregular. °· Contractions  are often felt in the front of the lower abdomen and in the groin. °· Contractions may go away when you walk around or change positions while lying down. °· Contractions get weaker and are shorter-lasting as time goes on. °· The cervix usually does not dilate or become thin. °Follow these instructions at home: ° °· Take over-the-counter and prescription medicines only as told by your health care provider. °· Keep up with your usual exercises and follow other instructions from your health care provider. °· Eat and drink lightly if you think you are going into labor. °· If Braxton Hicks contractions are making you uncomfortable: °? Change your position from lying down or resting to walking, or change from walking to resting. °? Sit and rest in a tub of warm water. °? Drink enough fluid to keep your urine pale yellow. Dehydration may cause these contractions. °? Do slow and deep breathing several times an hour. °· Keep all follow-up prenatal visits as told by your health care provider. This is important. °Contact a health care provider if: °· You have a fever. °· You have continuous pain in your abdomen. °Get help right away if: °· Your contractions become stronger, more regular, and closer together. °· You have fluid leaking or gushing from your vagina. °· You pass blood-tinged mucus (bloody show). °· You have bleeding from your vagina. °· You have low back pain that you never had before. °· You feel your baby’s head pushing down and causing pelvic pressure. °· Your baby is not moving inside you as much as it used to. °Summary °· Contractions that occur before labor are   called Braxton Hicks contractions, false labor, or practice contractions. °· Braxton Hicks contractions are usually shorter, weaker, farther apart, and less regular than true labor contractions. True labor contractions usually become progressively stronger and regular, and they become more frequent. °· Manage discomfort from Braxton Hicks contractions  by changing position, resting in a warm bath, drinking plenty of water, or practicing deep breathing. °This information is not intended to replace advice given to you by your health care provider. Make sure you discuss any questions you have with your health care provider. °Document Revised: 12/16/2016 Document Reviewed: 05/19/2016 °Elsevier Patient Education © 2020 [employer] Inc. ° °

## 2019-09-27 ENCOUNTER — Telehealth: Payer: Self-pay

## 2019-09-27 ENCOUNTER — Encounter: Payer: Self-pay | Admitting: Obstetrics and Gynecology

## 2019-09-27 ENCOUNTER — Telehealth: Payer: Self-pay | Admitting: Certified Nurse Midwife

## 2019-09-27 ENCOUNTER — Other Ambulatory Visit: Payer: Self-pay

## 2019-09-27 ENCOUNTER — Observation Stay
Admission: EM | Admit: 2019-09-27 | Discharge: 2019-09-27 | Disposition: A | Discharge status: Home / Self Care | Payer: Medicaid Other | Attending: Obstetrics and Gynecology | Admitting: Obstetrics and Gynecology

## 2019-09-27 DIAGNOSIS — O471 False labor at or after 37 completed weeks of gestation: Principal | ICD-10-CM

## 2019-09-27 DIAGNOSIS — Z3490 Encounter for supervision of normal pregnancy, unspecified, unspecified trimester: Secondary | ICD-10-CM

## 2019-09-27 DIAGNOSIS — Z3A38 38 weeks gestation of pregnancy: Secondary | ICD-10-CM | POA: Diagnosis not present

## 2019-09-27 DIAGNOSIS — Z91040 Latex allergy status: Secondary | ICD-10-CM | POA: Diagnosis not present

## 2019-09-27 MED ORDER — ZOLPIDEM TARTRATE 5 MG PO TABS
5.0000 mg | ORAL_TABLET | Freq: Every evening | ORAL | Status: DC | PRN
  Administered 2019-09-27: 10 mg via ORAL

## 2019-09-27 MED ORDER — ZOLPIDEM TARTRATE 5 MG PO TABS
ORAL_TABLET | ORAL | Status: AC
  Filled 2019-09-27: qty 2

## 2019-09-27 MED ORDER — MORPHINE SULFATE (PF) 4 MG/ML IV SOLN
4.0000 mg | Freq: Once | INTRAVENOUS | Status: AC | PRN
  Administered 2019-09-27: 4 mg via INTRAMUSCULAR
  Filled 2019-09-27: qty 1

## 2019-09-27 MED ORDER — SENNOSIDES-DOCUSATE SODIUM 8.6-50 MG PO TABS
ORAL_TABLET | ORAL | Status: AC
  Administered 2019-09-27: 2
  Filled 2019-09-27: qty 2

## 2019-09-27 MED ORDER — PROMETHAZINE HCL 25 MG/ML IJ SOLN
12.5000 mg | Freq: Four times a day (QID) | INTRAMUSCULAR | Status: DC | PRN
  Administered 2019-09-27: 12.5 mg via INTRAMUSCULAR
  Filled 2019-09-27: qty 1

## 2019-09-27 MED ORDER — LIDOCAINE 5 % EX PTCH
1.0000 | MEDICATED_PATCH | CUTANEOUS | Status: DC
  Administered 2019-09-27: 1 via TRANSDERMAL
  Filled 2019-09-27: qty 1

## 2019-09-27 MED ORDER — SENNA 8.6 MG PO TABS
2.0000 | ORAL_TABLET | Freq: Once | ORAL | Status: DC
  Filled 2019-09-27: qty 2

## 2019-09-27 NOTE — Progress Notes (Signed)
Discharged home with spouse. Pt tearful but understands that she is going home

## 2019-09-27 NOTE — Telephone Encounter (Signed)
Pt called in and stated that she is going to L& D to tell the nurse. The pt said that she just spoke to the nurse and that she isn't coming in the office. I told the pt I will let the nurse and provider know.

## 2019-09-27 NOTE — Telephone Encounter (Signed)
Called and spoke to pt. Pt c/o increase discharge since her las appointment 09/25/19. Pt states she is having white mucousy discharge with streaks of blood. Contractions every 8-10 minutes, pain in the upper abdomen, pelvic area and lower back. Informed Serafina Royals, CNM. Informed pt to go to L&D per Serafina Royals, CNM. However, pt does not want to. Informed pt she can come into office today for a NS and cervix checkT. Pt verbalized understanding and on her way to the office.

## 2019-09-27 NOTE — Telephone Encounter (Signed)
mychart message sent to patient

## 2019-09-27 NOTE — Telephone Encounter (Signed)
Pt called in and stated that she is having some discharge. The pt said it stated Wednesday it has gotten heavy, the pt said its white with blood.  The pt said she is not is a lot of pain but she does have pelvic pain. The pt is requesting a call back. Please advise

## 2019-09-29 DIAGNOSIS — O471 False labor at or after 37 completed weeks of gestation: Secondary | ICD-10-CM

## 2019-09-29 NOTE — Discharge Summary (Signed)
Obstetric Discharge Summary  Patient ID: Tammy Snyder MRN: [id number] DOB/AGE: 1990/03/18 29 y.o.   Date of Admission: 09/27/2019  Date of Discharge: 09/27/2019  Admitting Diagnosis: Observation at [redacted]w[redacted]d  Secondary Diagnosis:   Patient Active Problem List   Diagnosis Date Noted  . Labor and delivery, indication for care 07/04/2019  . Abdominal trauma   . Hypokalemia 05/06/2019  . Short interval between pregnancies affecting pregnancy, antepartum 05/06/2019  . Back pain affecting pregnancy in second trimester 05/06/2019  . Pregnancy related hip pain in second trimester, antepartum 05/06/2019  . Pregnancy related fatigue in second trimester 05/06/2019  . Acute pyelonephritis 04/28/2019  . Vaginal birth after cesarean 10/05/2018  . History of hypokalemia 09/13/2018  . Pregnancy 08/03/2018  . Palpitations 07/04/2018  . Orthostatic lightheadedness 07/04/2018  . Syncope and collapse 07/03/2018  . History of chlamydia 07/02/2018  . Desires VBAC (vaginal birth after cesarean) trial 07/02/2018  . Hearing loss 03/06/2018  . History of cesarean delivery 03/06/2018  . History of VBAC 03/06/2018      Discharge Diagnosis: False Labor   Antepartum Procedures: NST   Brief Hospital Course   L&D OB Triage Note  Tammy Snyder is a 29 y.o. [id number] female at [redacted]w[redacted]d, EDD Estimated Date of Delivery: 10/10/27 who presented to triage for complaints of irregular contractions (eight to 10 minutes) and back pain for the last few days.  She was evaluated by the nurses with no significant findings for labor or fetal distress. Vital signs stable. An NST was performed and has been reviewed by CNM. She was treated with lidocaine patch and therapeutic rest (IM morphine and phenergan).   NST INTERPRETATION: Indications: rule out uterine contractions  Mode: External Baseline Rate (A): 140 bpm Variability: Moderate Accelerations: 10 x 10 Decelerations: None Contraction Frequency (min):  irregular  Impression: reactive  Dilation: 3 Effacement (%): 50 Cervical Position: Posterior Station: -3 Presentation: Vertex Exam by:: JDaley   Plan: NST performed was reviewed and was found to be reactive. She was discharged home with bleeding/labor precautions.  Continue routine prenatal care. Follow up with CNM as previously scheduled.    Discharge Instructions: Per After Visit Summary.  Activity: Advance as tolerated. Pelvic rest for 6 weeks.  Also refer to After Visit Summary  Diet: Regular  Medications: Allergies as of 09/27/2019      Reactions   Latex Rash   Nsaids Rash   Tylenol [acetaminophen] Itching, Rash      Medication List    ASK your doctor about these medications   CITRANATAL DHA PO Take 1 tablet by mouth daily.   [employer] Hemorrhoidal & Analgesic 1 % ointment Generic drug: dibucaine SMARTSIG:Rectally   dibucaine 1 % Oint Commonly known as: NUPERCAINAL Place 1 application rectally 3 (three) times daily as needed.   hydrOXYzine 25 MG tablet Commonly known as: ATARAX/VISTARIL Take 1 tablet (25 mg total) by mouth every 6 (six) hours as needed for anxiety.   nitrofurantoin (macrocrystal-monohydrate) 100 MG capsule Commonly known as: MACROBID Take 100 mg by mouth 2 (two) times daily.      Outpatient follow up: As previously scheduled or sooner if needed  Postpartum contraception: bilateral tubal ligation  Discharged Condition: stable  Discharged to: home   Serafina Royals, CNM  [facility], [facility]

## 2019-10-01 ENCOUNTER — Telehealth: Payer: Self-pay

## 2019-10-01 NOTE — Telephone Encounter (Signed)
mychart message sent to patient

## 2019-10-03 ENCOUNTER — Other Ambulatory Visit: Payer: Self-pay

## 2019-10-03 ENCOUNTER — Encounter: Payer: Self-pay | Admitting: Obstetrics and Gynecology

## 2019-10-03 ENCOUNTER — Ambulatory Visit (INDEPENDENT_AMBULATORY_CARE_PROVIDER_SITE_OTHER): Payer: Medicaid Other | Admitting: Obstetrics and Gynecology

## 2019-10-03 VITALS — BP 122/73 | HR 102 | Wt 178.2 lb

## 2019-10-03 DIAGNOSIS — Z3A39 39 weeks gestation of pregnancy: Secondary | ICD-10-CM | POA: Diagnosis not present

## 2019-10-03 DIAGNOSIS — Z3483 Encounter for supervision of other normal pregnancy, third trimester: Secondary | ICD-10-CM | POA: Diagnosis not present

## 2019-10-03 DIAGNOSIS — O34219 Maternal care for unspecified type scar from previous cesarean delivery: Secondary | ICD-10-CM

## 2019-10-03 DIAGNOSIS — Z98891 History of uterine scar from previous surgery: Secondary | ICD-10-CM

## 2019-10-03 DIAGNOSIS — O480 Post-term pregnancy: Secondary | ICD-10-CM

## 2019-10-03 DIAGNOSIS — O09899 Supervision of other high risk pregnancies, unspecified trimester: Secondary | ICD-10-CM

## 2019-10-03 LAB — POCT URINALYSIS DIPSTICK OB
Bilirubin, UA: NEGATIVE
Blood, UA: NEGATIVE
Glucose, UA: NEGATIVE
Ketones, UA: NEGATIVE
Leukocytes, UA: NEGATIVE
Nitrite, UA: NEGATIVE
POC,PROTEIN,UA: NEGATIVE
Spec Grav, UA: 1.01 (ref 1.010–1.025)
Urobilinogen, UA: 0.2 E.U./dL
pH, UA: 5 (ref 5.0–8.0)

## 2019-10-03 NOTE — Progress Notes (Signed)
ROB: Patient states concerns that her baby just "doesn't want to come". Is worried about going too far pas there due date and her baby being too large and having to have another C-section. Reports that "her body does not make Pitocin and I have to get it every time I'm in labor". She has had 2 successful VBACs. Discussed that by Leopold's she does not appear to have a signficantly large fetus. Will be scheduled for [place] next week if she goes postdates and can assess fetal weight.  Patient also desires membrane stripping.  Performed today. Reiterated labor precautions. RTC in 1 week if undelivered with midwives.

## 2019-10-03 NOTE — Patient Instructions (Signed)

## 2019-10-05 ENCOUNTER — Inpatient Hospital Stay: Payer: Medicaid Other | Admitting: Anesthesiology

## 2019-10-05 ENCOUNTER — Encounter
Admission: EM | Disposition: A | Discharge status: Home / Self Care | Payer: Self-pay | Source: Home / Self Care | Attending: Certified Nurse Midwife

## 2019-10-05 ENCOUNTER — Inpatient Hospital Stay
Admission: EM | Admit: 2019-10-05 | Discharge: 2019-10-08 | DRG: 797 | Disposition: A | Discharge status: Home / Self Care | Payer: Medicaid Other | Attending: Certified Nurse Midwife | Admitting: Certified Nurse Midwife

## 2019-10-05 ENCOUNTER — Other Ambulatory Visit: Payer: Self-pay

## 2019-10-05 ENCOUNTER — Encounter: Payer: Self-pay | Admitting: Obstetrics and Gynecology

## 2019-10-05 DIAGNOSIS — Z302 Encounter for sterilization: Secondary | ICD-10-CM | POA: Diagnosis not present

## 2019-10-05 DIAGNOSIS — Z91040 Latex allergy status: Secondary | ICD-10-CM

## 2019-10-05 DIAGNOSIS — O09899 Supervision of other high risk pregnancies, unspecified trimester: Secondary | ICD-10-CM

## 2019-10-05 DIAGNOSIS — Z87891 Personal history of nicotine dependence: Secondary | ICD-10-CM | POA: Diagnosis not present

## 2019-10-05 DIAGNOSIS — Z1331 Encounter for screening for depression: Secondary | ICD-10-CM

## 2019-10-05 DIAGNOSIS — O2243 Hemorrhoids in pregnancy, third trimester: Secondary | ICD-10-CM | POA: Diagnosis present

## 2019-10-05 DIAGNOSIS — O34219 Maternal care for unspecified type scar from previous cesarean delivery: Principal | ICD-10-CM | POA: Diagnosis present

## 2019-10-05 DIAGNOSIS — Z20822 Contact with and (suspected) exposure to covid-19: Secondary | ICD-10-CM | POA: Diagnosis present

## 2019-10-05 DIAGNOSIS — O26893 Other specified pregnancy related conditions, third trimester: Secondary | ICD-10-CM | POA: Diagnosis present

## 2019-10-05 DIAGNOSIS — Z3490 Encounter for supervision of normal pregnancy, unspecified, unspecified trimester: Secondary | ICD-10-CM

## 2019-10-05 DIAGNOSIS — Z3A39 39 weeks gestation of pregnancy: Secondary | ICD-10-CM | POA: Diagnosis not present

## 2019-10-05 DIAGNOSIS — Z98891 History of uterine scar from previous surgery: Secondary | ICD-10-CM

## 2019-10-05 HISTORY — PX: TUBAL LIGATION: SHX77

## 2019-10-05 LAB — CBC
HCT: 33.1 % — ABNORMAL LOW (ref 36.0–46.0)
Hemoglobin: 12.2 g/dL (ref 12.0–15.0)
MCH: 33.8 pg (ref 26.0–34.0)
MCHC: 36.9 g/dL — ABNORMAL HIGH (ref 30.0–36.0)
MCV: 91.7 fL (ref 80.0–100.0)
Platelets: 148 10*3/uL — ABNORMAL LOW (ref 150–400)
RBC: 3.61 MIL/uL — ABNORMAL LOW (ref 3.87–5.11)
RDW: 13.7 % (ref 11.5–15.5)
WBC: 11.7 10*3/uL — ABNORMAL HIGH (ref 4.0–10.5)
nRBC: 0 % (ref 0.0–0.2)

## 2019-10-05 LAB — SARS CORONAVIRUS 2 BY RT PCR (HOSPITAL ORDER, PERFORMED IN ~~LOC~~ HOSPITAL LAB): SARS Coronavirus 2: NEGATIVE

## 2019-10-05 LAB — TYPE AND SCREEN
ABO/RH(D): O POS
Antibody Screen: NEGATIVE

## 2019-10-05 LAB — RPR: RPR Ser Ql: NONREACTIVE

## 2019-10-05 LAB — RAPID HIV SCREEN (HIV 1/2 AB+AG)
HIV 1/2 Antibodies: NONREACTIVE
HIV-1 P24 Antigen - HIV24: NONREACTIVE

## 2019-10-05 SURGERY — LIGATION, FALLOPIAN TUBE, POSTPARTUM
Anesthesia: General | Site: Abdomen | Laterality: Bilateral

## 2019-10-05 MED ORDER — ZOLPIDEM TARTRATE 5 MG PO TABS: 5.0000 mg | ORAL_TABLET | Freq: Every evening | ORAL | Status: DC | PRN

## 2019-10-05 MED ORDER — LACTATED RINGERS IV SOLN: INTRAVENOUS | Status: DC | PRN

## 2019-10-05 MED ORDER — DIPHENHYDRAMINE HCL 25 MG PO CAPS: 25.0000 mg | ORAL_CAPSULE | Freq: Four times a day (QID) | ORAL | Status: DC | PRN

## 2019-10-05 MED ORDER — OXYTOCIN BOLUS FROM INFUSION: 333.0000 mL | Freq: Once | INTRAVENOUS | Status: AC

## 2019-10-05 MED ORDER — DIBUCAINE (PERIANAL) 1 % EX OINT
1.0000 "application " | TOPICAL_OINTMENT | CUTANEOUS | Status: DC | PRN
  Administered 2019-10-07: 1 via RECTAL
  Filled 2019-10-05 (×2): qty 28

## 2019-10-05 MED ORDER — METHYLERGONOVINE MALEATE 0.2 MG PO TABS
0.2000 mg | ORAL_TABLET | ORAL | Status: DC | PRN
  Filled 2019-10-05: qty 1

## 2019-10-05 MED ORDER — LIDOCAINE HCL (PF) 1 % IJ SOLN
INTRAMUSCULAR | Status: AC
  Filled 2019-10-05: qty 30

## 2019-10-05 MED ORDER — PHENYLEPHRINE 40 MCG/ML (10ML) SYRINGE FOR IV PUSH (FOR BLOOD PRESSURE SUPPORT): 80.0000 ug | PREFILLED_SYRINGE | INTRAVENOUS | Status: DC | PRN

## 2019-10-05 MED ORDER — EPHEDRINE 5 MG/ML INJ: 10.0000 mg | INTRAVENOUS | Status: DC | PRN

## 2019-10-05 MED ORDER — SODIUM CHLORIDE 0.9 % IV SOLN: 250.0000 mL | INTRAVENOUS | Status: DC | PRN

## 2019-10-05 MED ORDER — BUTORPHANOL TARTRATE 1 MG/ML IJ SOLN: 1.0000 mg | INTRAMUSCULAR | Status: DC | PRN

## 2019-10-05 MED ORDER — OXYTOCIN 10 UNIT/ML IJ SOLN
INTRAMUSCULAR | Status: AC
  Filled 2019-10-05: qty 2

## 2019-10-05 MED ORDER — FAMOTIDINE 20 MG PO TABS
40.0000 mg | ORAL_TABLET | Freq: Once | ORAL | Status: AC
  Administered 2019-10-05: 40 mg via ORAL
  Filled 2019-10-05: qty 2

## 2019-10-05 MED ORDER — EPHEDRINE SULFATE 50 MG/ML IJ SOLN
INTRAMUSCULAR | Status: DC | PRN
  Administered 2019-10-05 (×2): 15 mg via INTRAVENOUS

## 2019-10-05 MED ORDER — OXYTOCIN-SODIUM CHLORIDE 30-0.9 UT/500ML-% IV SOLN: 2.5000 [IU]/h | INTRAVENOUS | Status: DC

## 2019-10-05 MED ORDER — SENNOSIDES-DOCUSATE SODIUM 8.6-50 MG PO TABS
2.0000 | ORAL_TABLET | ORAL | Status: DC
  Administered 2019-10-06 – 2019-10-07 (×2): 2 via ORAL
  Filled 2019-10-05 (×2): qty 2

## 2019-10-05 MED ORDER — BENZOCAINE-MENTHOL 20-0.5 % EX AERO
1.0000 "application " | INHALATION_SPRAY | CUTANEOUS | Status: DC | PRN
  Administered 2019-10-05: 1 via TOPICAL
  Filled 2019-10-05 (×2): qty 56

## 2019-10-05 MED ORDER — ONDANSETRON HCL 4 MG/2ML IJ SOLN: 4.0000 mg | Freq: Four times a day (QID) | INTRAMUSCULAR | Status: DC | PRN

## 2019-10-05 MED ORDER — PROPOFOL 10 MG/ML IV BOLUS
INTRAVENOUS | Status: DC | PRN
  Administered 2019-10-05: 40 mg via INTRAVENOUS
  Administered 2019-10-05: 50 mg via INTRAVENOUS
  Administered 2019-10-05: 60 mg via INTRAVENOUS
  Administered 2019-10-05: 50 mg via INTRAVENOUS
  Administered 2019-10-05: 40 mg via INTRAVENOUS

## 2019-10-05 MED ORDER — ONDANSETRON HCL 4 MG/2ML IJ SOLN: 4.0000 mg | Freq: Once | INTRAMUSCULAR | Status: DC | PRN

## 2019-10-05 MED ORDER — PRENATAL MULTIVITAMIN CH: 1.0000 | ORAL_TABLET | Freq: Every day | ORAL | Status: DC

## 2019-10-05 MED ORDER — LIDOCAINE HCL (PF) 1 % IJ SOLN: 30.0000 mL | INTRAMUSCULAR | Status: DC | PRN

## 2019-10-05 MED ORDER — PROPOFOL 500 MG/50ML IV EMUL
INTRAVENOUS | Status: AC
  Filled 2019-10-05: qty 50

## 2019-10-05 MED ORDER — BUPIVACAINE HCL (PF) 0.25 % IJ SOLN
INTRAMUSCULAR | Status: DC | PRN
  Administered 2019-10-05: 5 mL via EPIDURAL
  Administered 2019-10-05: 3 mL via EPIDURAL

## 2019-10-05 MED ORDER — LIDOCAINE HCL (PF) 1 % IJ SOLN
INTRAMUSCULAR | Status: DC | PRN
  Administered 2019-10-05: 3 mL via SUBCUTANEOUS

## 2019-10-05 MED ORDER — OXYTOCIN-SODIUM CHLORIDE 30-0.9 UT/500ML-% IV SOLN
INTRAVENOUS | Status: AC
  Administered 2019-10-05: 333 mL via INTRAVENOUS
  Filled 2019-10-05: qty 500

## 2019-10-05 MED ORDER — BENZOCAINE-MENTHOL 20-0.5 % EX AERO: 1.0000 "application " | INHALATION_SPRAY | CUTANEOUS | Status: DC | PRN

## 2019-10-05 MED ORDER — SODIUM CHLORIDE 0.9% FLUSH
3.0000 mL | Freq: Two times a day (BID) | INTRAVENOUS | Status: DC
  Administered 2019-10-05 – 2019-10-07 (×2): 3 mL via INTRAVENOUS

## 2019-10-05 MED ORDER — SIMETHICONE 80 MG PO CHEW
80.0000 mg | CHEWABLE_TABLET | ORAL | Status: DC | PRN
  Administered 2019-10-05 – 2019-10-08 (×5): 80 mg via ORAL
  Filled 2019-10-05 (×5): qty 1

## 2019-10-05 MED ORDER — AMMONIA AROMATIC IN INHA
RESPIRATORY_TRACT | Status: AC
  Filled 2019-10-05: qty 10

## 2019-10-05 MED ORDER — BUPIVACAINE IN DEXTROSE 0.75-8.25 % IT SOLN
INTRATHECAL | Status: DC | PRN
  Administered 2019-10-05: 1.5 mL via INTRATHECAL

## 2019-10-05 MED ORDER — COCONUT OIL OIL
1.0000 "application " | TOPICAL_OIL | Status: DC | PRN
  Filled 2019-10-05: qty 120

## 2019-10-05 MED ORDER — DIPHENHYDRAMINE HCL 50 MG/ML IJ SOLN: 12.5000 mg | INTRAMUSCULAR | Status: DC | PRN

## 2019-10-05 MED ORDER — LACTATED RINGERS IV SOLN
500.0000 mL | Freq: Once | INTRAVENOUS | Status: AC
  Administered 2019-10-05: 500 mL via INTRAVENOUS

## 2019-10-05 MED ORDER — LACTATED RINGERS IV SOLN: INTRAVENOUS | Status: DC

## 2019-10-05 MED ORDER — LIDOCAINE-EPINEPHRINE (PF) 1.5 %-1:200000 IJ SOLN
INTRAMUSCULAR | Status: DC | PRN
  Administered 2019-10-05: 4 mL via EPIDURAL

## 2019-10-05 MED ORDER — OXYCODONE HCL 5 MG PO TABS: 5.0000 mg | ORAL_TABLET | ORAL | Status: DC | PRN

## 2019-10-05 MED ORDER — SODIUM CHLORIDE 0.9% FLUSH: 3.0000 mL | INTRAVENOUS | Status: DC | PRN

## 2019-10-05 MED ORDER — DOCUSATE SODIUM 100 MG PO CAPS
100.0000 mg | ORAL_CAPSULE | Freq: Two times a day (BID) | ORAL | Status: DC
  Administered 2019-10-05 – 2019-10-08 (×6): 100 mg via ORAL
  Filled 2019-10-05 (×6): qty 1

## 2019-10-05 MED ORDER — MISOPROSTOL 200 MCG PO TABS
ORAL_TABLET | ORAL | Status: AC
  Filled 2019-10-05: qty 4

## 2019-10-05 MED ORDER — FENTANYL 2.5 MCG/ML W/ROPIVACAINE 0.15% IN NS 100 ML EPIDURAL (ARMC)
12.0000 mL/h | EPIDURAL | Status: DC
  Administered 2019-10-05: 12 mL/h via EPIDURAL

## 2019-10-05 MED ORDER — MIDAZOLAM HCL 2 MG/2ML IJ SOLN
INTRAMUSCULAR | Status: AC
  Filled 2019-10-05: qty 2

## 2019-10-05 MED ORDER — PRENATAL MULTIVITAMIN CH
1.0000 | ORAL_TABLET | Freq: Every day | ORAL | Status: DC
  Administered 2019-10-06 – 2019-10-07 (×2): 1 via ORAL
  Filled 2019-10-05 (×2): qty 1

## 2019-10-05 MED ORDER — SIMETHICONE 80 MG PO CHEW: 80.0000 mg | CHEWABLE_TABLET | ORAL | Status: DC | PRN

## 2019-10-05 MED ORDER — OXYCODONE HCL 5 MG PO TABS
10.0000 mg | ORAL_TABLET | ORAL | Status: DC | PRN
  Administered 2019-10-05 (×2): 10 mg via ORAL
  Filled 2019-10-05: qty 2

## 2019-10-05 MED ORDER — OXYTOCIN-SODIUM CHLORIDE 30-0.9 UT/500ML-% IV SOLN: 2.5000 [IU]/h | INTRAVENOUS | Status: DC | PRN

## 2019-10-05 MED ORDER — TETANUS-DIPHTH-ACELL PERTUSSIS 5-2.5-18.5 LF-MCG/0.5 IM SUSP: 0.5000 mL | Freq: Once | INTRAMUSCULAR | Status: DC

## 2019-10-05 MED ORDER — METHYLERGONOVINE MALEATE 0.2 MG/ML IJ SOLN: 0.2000 mg | INTRAMUSCULAR | Status: DC | PRN

## 2019-10-05 MED ORDER — OXYCODONE HCL 5 MG PO TABS
10.0000 mg | ORAL_TABLET | ORAL | Status: DC | PRN
  Administered 2019-10-05 – 2019-10-08 (×13): 10 mg via ORAL
  Filled 2019-10-05 (×14): qty 2

## 2019-10-05 MED ORDER — EPHEDRINE 5 MG/ML INJ
INTRAVENOUS | Status: AC
  Filled 2019-10-05: qty 10

## 2019-10-05 MED ORDER — WITCH HAZEL-GLYCERIN EX PADS
1.0000 "application " | MEDICATED_PAD | CUTANEOUS | Status: DC | PRN
  Administered 2019-10-07 (×2): 1 via TOPICAL
  Filled 2019-10-05 (×3): qty 100

## 2019-10-05 MED ORDER — LACTATED RINGERS IV SOLN: 500.0000 mL | INTRAVENOUS | Status: DC | PRN

## 2019-10-05 MED ORDER — ONDANSETRON HCL 4 MG/2ML IJ SOLN: 4.0000 mg | INTRAMUSCULAR | Status: DC | PRN

## 2019-10-05 MED ORDER — FENTANYL CITRATE (PF) 100 MCG/2ML IJ SOLN: 25.0000 ug | INTRAMUSCULAR | Status: DC | PRN

## 2019-10-05 MED ORDER — METOCLOPRAMIDE HCL 10 MG PO TABS
10.0000 mg | ORAL_TABLET | Freq: Once | ORAL | Status: AC
  Administered 2019-10-05: 10 mg via ORAL
  Filled 2019-10-05: qty 1

## 2019-10-05 MED ORDER — MIDAZOLAM HCL 2 MG/2ML IJ SOLN
INTRAMUSCULAR | Status: DC | PRN
  Administered 2019-10-05: 2 mg via INTRAVENOUS

## 2019-10-05 MED ORDER — SOD CITRATE-CITRIC ACID 500-334 MG/5ML PO SOLN: 30.0000 mL | ORAL | Status: DC | PRN

## 2019-10-05 MED ORDER — ONDANSETRON HCL 4 MG PO TABS
4.0000 mg | ORAL_TABLET | ORAL | Status: DC | PRN
  Filled 2019-10-05: qty 1

## 2019-10-05 MED ORDER — FENTANYL 2.5 MCG/ML W/ROPIVACAINE 0.15% IN NS 100 ML EPIDURAL (ARMC)
EPIDURAL | Status: AC
  Filled 2019-10-05: qty 100

## 2019-10-05 SURGICAL SUPPLY — 31 items
BENZOIN TINCTURE PRP APPL 2/3 (GAUZE/BANDAGES/DRESSINGS) ×3 IMPLANT
BLADE SURG 15 STRL LF DISP TIS (BLADE) ×1 IMPLANT
BLADE SURG 15 STRL SS (BLADE) ×2
BNDG ADH 2 X3.75 FABRIC TAN LF (GAUZE/BANDAGES/DRESSINGS) ×3 IMPLANT
CHLORAPREP W/TINT 26 (MISCELLANEOUS) ×3 IMPLANT
CLIP FILSHIE TUBAL LIGA STRL (Clip) ×3 IMPLANT
CLOSURE WOUND 1/2 X4 (GAUZE/BANDAGES/DRESSINGS) ×1
COVER WAND RF STERILE (DRAPES) IMPLANT
DRAPE LAPAROTOMY 77X122 PED (DRAPES) ×3 IMPLANT
DRSG TEGADERM 2-3/8X2-3/4 SM (GAUZE/BANDAGES/DRESSINGS) ×3 IMPLANT
DRSG TELFA 4X3 1S NADH ST (GAUZE/BANDAGES/DRESSINGS) ×3 IMPLANT
GLOVE BIO SURGEON STRL SZ8 (GLOVE) ×3 IMPLANT
GLOVE BIOGEL PI ORTHO PRO 7.5 (GLOVE) ×2
GLOVE PI ORTHO PRO STRL 7.5 (GLOVE) ×1 IMPLANT
GOWN STRL REUS W/ TWL LRG LVL3 (GOWN DISPOSABLE) ×2 IMPLANT
GOWN STRL REUS W/TWL LRG LVL3 (GOWN DISPOSABLE) ×4
KIT TURNOVER CYSTO (KITS) ×3 IMPLANT
LABEL OR SOLS (LABEL) ×3 IMPLANT
NEEDLE HYPO 25GX1X1/2 BEV (NEEDLE) ×3 IMPLANT
NS IRRIG 500ML POUR BTL (IV SOLUTION) ×3 IMPLANT
PACK BASIN MINOR (MISCELLANEOUS) ×3 IMPLANT
RETRACTOR RING XSMALL (MISCELLANEOUS) ×1 IMPLANT
RTRCTR WOUND ALEXIS 13CM XS SH (MISCELLANEOUS) ×3
SPONGE GAUZE 2X2 8PLY STER LF (GAUZE/BANDAGES/DRESSINGS) ×1
SPONGE GAUZE 2X2 8PLY STRL LF (GAUZE/BANDAGES/DRESSINGS) ×2 IMPLANT
STRIP CLOSURE SKIN 1/2X4 (GAUZE/BANDAGES/DRESSINGS) ×2 IMPLANT
SUT VIC AB 4-0 PS2 18 (SUTURE) ×3 IMPLANT
SUT VICRYL 0 AB UR-6 (SUTURE) ×6 IMPLANT
SYR 10ML LL (SYRINGE) ×3 IMPLANT
TAPE SURG TRANSPORE 1 IN (GAUZE/BANDAGES/DRESSINGS) ×1 IMPLANT
TAPE SURGICAL TRANSPORE 1 IN (GAUZE/BANDAGES/DRESSINGS) ×2

## 2019-10-05 NOTE — Plan of Care (Signed)
Alert and oriented with aprop. Affect. Color good, skin w&d. BBS clear. Assessment and VS WNL. Umbilical dressing d&I. Transition of Care Consult placed due to Edinburgh Level at "10". Pt. Denies c/o at this time and appears comfortable and in NAD.

## 2019-10-05 NOTE — H&P (Signed)
Obstetric History and Physical  Tammy Snyder is a 29 y.o. [phone] with IUP at [redacted]w[redacted]d presenting with painful contractions since 2100 last night.   Patient states she has been having  regular, every two (2) to four (4) minutes contractions, none vaginal bleeding, intact membranes, with active fetal movement.    Denies difficulty breathing or respiratory distress, chest pain, dysuria, and leg pain or swelling.   Prenatal Course  Source of Care: [facility]-initial visits at 13 weeks, total visits: 10  Pregnancy complications or risks:  history of cesarean section, history of vaginal birth after cesarean section x 2, history UTI in pregnancy, hearing loss  Prenatal labs and studies:  ABO, Rh: O/Positive/-- 05-02-22 6834)  Antibody: Negative 05/02/2022 0943)  Rubella: 1.62 May 02, 2022 1962)  Varicella: 774 05-02-2022 0943)  RPR: Non Reactive (07/06 1127)   HBsAg: Negative 05/02/22 0943)   HIV: pending  IWL:NLGXQJJH/-- (08/30 1647)  1 hr Glucola: 146 (07/06 1127)  Gestational Glucose Tolerance (08/03 0830)  Genetic screening: Low risk female 2022/05/02 0943)  Anatomy US: Complete, normal (06/02 1526)  Past Medical History:  Diagnosis Date  . Bipolar 1 disorder (HCC)   . Borderline personality disorder (HCC)   . Cochlear implant in place   . Hearing loss   . Interstitial cystitis   . Mental disorder    Borderline personalities disorder  . Oliguria 2013   cystoscopy, hydrodilation under anesthesia  . Polysubstance abuse (HCC)   . Vaginal Pap smear, abnormal     Past Surgical History:  Procedure Laterality Date  . Calibration of urethra with dilation of the urethra under anesthesia  04/04/2011   Dr. Rosezetta Schlatter  . CESAREAN SECTION  10/29/2010   Procedure: CESAREAN SECTION;  Surgeon: Lazaro Arms, MD;  Location: [facility];  Service: Gynecology;;  . CESAREAN SECTION    . COCHLEAR IMPLANT    . COCHLEAR IMPLANT    . cystoscopy, hydrodilation under anesthesia  04/04/2011    OB History   Gravida Para Term Preterm AB Living  4 3 3  0 0 3  SAB TAB Ectopic Multiple Live Births  0 0 0 0 3    # Outcome Date GA Lbr Len/2nd Weight Sex Delivery Anes PTL Lv  4 Current           3 Term 10/05/18 [redacted]w[redacted]d 389:29 / 00:16 3090 g F Vag-Spont EPI  LIV  2 Term 02/21/13 [redacted]w[redacted]d  3572 g M Vag-Spont  N LIV  1 Term 10/29/10 [redacted]w[redacted]d 29:01 / 03:53 4105 g M CS-LTranv EPI  LIV     Birth Comments: No dysmophic features; voided on OR table.     Social History   Socioeconomic History  . Marital status: Married    Spouse name: [redacted]w[redacted]d  . Number of children: Not on file  . Years of education: Not on file  . Highest education level: Not on file  Occupational History  . Not on file  Tobacco Use  . Smoking status: Former Smoker    Packs/day: 1.00    Years: 8.00    Pack years: 8.00    Types: Cigarettes    Quit date: 01/02/2018    Years since quitting: 1.7  . Smokeless tobacco: Never Used  Vaping Use  . Vaping Use: Never used  Substance and Sexual Activity  . Alcohol use: Not Currently    Alcohol/week: 0.0 standard drinks  . Drug use: No  . Sexual activity: Yes    Partners: Male    Birth control/protection:  None    Comment: planning tubal  Other Topics Concern  . Not on file  Social History Narrative  . Not on file   Social Determinants of Health   Financial Resource Strain:   . Difficulty of Paying Living Expenses: Not on file  Food Insecurity:   . Worried About Programme researcher, broadcasting/film/video in the Last Year: Not on file  . Ran Out of Food in the Last Year: Not on file  Transportation Needs:   . Lack of Transportation (Medical): Not on file  . Lack of Transportation (Non-Medical): Not on file  Physical Activity:   . Days of Exercise per Week: Not on file  . Minutes of Exercise per Session: Not on file  Stress:   . Feeling of Stress : Not on file  Social Connections:   . Frequency of Communication with Friends and Family: Not on file  . Frequency of Social Gatherings with Friends and  Family: Not on file  . Attends Religious Services: Not on file  . Active Member of Clubs or Organizations: Not on file  . Attends Banker Meetings: Not on file  . Marital Status: Not on file    Family History  Problem Relation Age of Onset  . Hypertension Father   . Stroke Father   . Hypertension Mother   . Lung cancer Maternal Grandfather   . Syncope episode Paternal Grandmother     Medications Prior to Admission  Medication Sig Dispense Refill Last Dose  . hydrOXYzine (ATARAX/VISTARIL) 25 MG tablet Take 1 tablet (25 mg total) by mouth every 6 (six) hours as needed for anxiety. 30 tablet 2 Past Month at Unknown time  . nitrofurantoin, macrocrystal-monohydrate, (MACROBID) 100 MG capsule Take 100 mg by mouth 2 (two) times daily.   10/05/2019 at Unknown time  . Prenat w/o A-FeCbGl-DSS-FA-DHA (CITRANATAL DHA PO) Take 1 tablet by mouth daily.   10/05/2019 at Unknown time    Allergies  Allergen Reactions  . Latex Rash  . Nsaids Rash  . Tylenol [Acetaminophen] Itching and Rash    Review of Systems: Negative except for what is mentioned in HPI.  Physical Exam:  BP (!) 118/57 (BP Location: Left Arm)   Pulse 79   Temp 98.4 F (36.9 C) (Oral)   Resp 18   Ht 5' (1.524 m)   Wt 81.2 kg   BMI 34.96 kg/m   GENERAL: Well-developed, well-nourished female in no acute distress.   LUNGS: Clear to auscultation bilaterally.   HEART: Regular rate and rhythm.  ABDOMEN: Soft, nontender, nondistended, gravid.  EXTREMITIES: Nontender, no edema, 2+ distal pulses.  Cervical Exam: Dilation: 5.5 Effacement (%): 90 Cervical Position: Middle Station: -1 Presentation: Vertex Exam by:: Ardine Eng RN  FHR Category I  Contractions: Every two (2) to four (4) minutes; soft resting tone   Pertinent Labs/Studies:   Results for orders placed or performed during the hospital encounter of 10/05/19 (from the past 24 hour(s))  CBC     Status: Abnormal   Collection Time: 10/05/19  3:18  AM  Result Value Ref Range   WBC 11.7 (H) 4.0 - 10.5 K/uL   RBC 3.61 (L) 3.87 - 5.11 MIL/uL   Hemoglobin 12.2 12.0 - 15.0 g/dL   HCT 16.1 (L) 36 - 46 %   MCV 91.7 80.0 - 100.0 fL   MCH 33.8 26.0 - 34.0 pg   MCHC 36.9 (H) 30.0 - 36.0 g/dL   RDW 09.6 04.5 - 40.9 %   Platelets  148 (L) 150 - 400 K/uL   nRBC 0.0 0.0 - 0.2 %    Assessment :  Tammy Snyder is a 29 y.o. [phone] at [redacted]w[redacted]d being admitted for labor, Rh positive,GBS negative, history of cesarean section, history of vaginal birth after cesarean section x 2, history UTI in pregnancy, hearing loss  FHR Category I  Plan:  Admit to birthing suites, see orders.   Labor: Expectant management.  Induction/Augmentation as needed, per protocol.  Delivery plan: Hopeful for vaginal birth.   Dr. Logan Bores and anesthesia notified of admission and plan of care.    Gunnar Bulla, CNM [facility], [facility] 10/05/19 4:20 AM

## 2019-10-05 NOTE — Interval H&P Note (Signed)
History and Physical Interval Note:  10/05/2019 10:47 AM  Tammy Snyder  has presented today for surgery, with the diagnosis of sterilization.  The various methods of treatment have been discussed with the patient and family. After consideration of risks, benefits and other options for treatment, the patient has consented to  Procedure(s): POST PARTUM TUBAL LIGATION (Bilateral) as a surgical intervention.  The patient's history has been reviewed, patient examined, no change in status, stable for surgery.  I have reviewed the patient's chart and labs.  Questions were answered to the patient's satisfaction.     Brennan Bailey

## 2019-10-05 NOTE — Transfer of Care (Signed)
Immediate Anesthesia Transfer of Care Note  Patient: Tammy Snyder  Procedure(s) Performed: POST PARTUM TUBAL LIGATION (Bilateral Abdomen)  Patient Location: PACU  Anesthesia Type:spinal  Level of Consciousness: awake, alert  and oriented  Airway & Oxygen Therapy: Patient Spontanous Breathing  Post-op Assessment: Report given to RN and Post -op Vital signs reviewed and stable  Post vital signs: Reviewed and stable  Last Vitals:  Vitals Value Taken Time  BP 96/46 10/05/19 1150  Temp    Pulse 92 10/05/19 1151  Resp 22 10/05/19 1151  SpO2 96 % 10/05/19 1151  Vitals shown include unvalidated device data.  Last Pain:  Vitals:   10/05/19 0715  TempSrc:   PainSc: 0-No pain      Patients Stated Pain Goal: 0 (10/05/19 0357)  Complications: No complications documented.

## 2019-10-05 NOTE — Anesthesia Procedure Notes (Signed)
Spinal  Patient location during procedure: OR Start time: 10/05/2019 11:08 AM End time: 10/05/2019 11:11 AM Preanesthetic Checklist Completed: patient identified, IV checked, site marked, risks and benefits discussed, surgical consent, monitors and equipment checked, pre-op evaluation and timeout performed Spinal Block Patient position: sitting Prep: ChloraPrep Patient monitoring: heart rate, continuous pulse ox, blood pressure and cardiac monitor Approach: midline Location: L4-5 Injection technique: single-shot Needle Needle type: Whitacre and Introducer  Needle gauge: 24 G Needle length: 9 cm Assessment Sensory level: T6 Additional Notes Negative paresthesia. Negative blood return. Positive free-flowing CSF. Expiration date of kit checked and confirmed. Patient tolerated procedure well, without complications.

## 2019-10-05 NOTE — Anesthesia Procedure Notes (Signed)
Epidural Patient location during procedure: OB  Staffing Performed: anesthesiologist   Preanesthetic Checklist Completed: patient identified, IV checked, site marked, risks and benefits discussed, surgical consent, monitors and equipment checked, pre-op evaluation and timeout performed  Epidural Patient position: sitting Prep: Betadine Patient monitoring: heart rate, continuous pulse ox and blood pressure Approach: midline Location: L4-L5 Injection technique: LOR saline  Needle:  Needle type: Tuohy  Needle gauge: 17 G Needle length: 9 cm and 9 Needle insertion depth: 6 cm Catheter type: closed end flexible Catheter size: 19 Gauge Catheter at skin depth: 12 cm Test dose: negative and 1.5% lidocaine with Epi 1:200 K  Assessment Sensory level: T10 Events: blood not aspirated, injection not painful, no injection resistance, no paresthesia and negative IV test  Additional Notes   Patient tolerated the insertion well without complications.-SATD -IVTD. No paresthesia. Refer to [facility] nursing for VS and dosingReason for block:procedure for pain     

## 2019-10-05 NOTE — Op Note (Signed)
      OPERATIVE NOTE 10/05/2019 11:47 AM  PRE-OPERATIVE DIAGNOSIS:  1) Desires sterilization  POST-OPERATIVE DIAGNOSIS:  1) Same  OPERATION:  Post-partum Filshie clip tubal occlusion for sterilization  SURGEON(S): Surgeon(s) and Role:    Linzie Collin, MD - Primary   ANESTHESIA: Spinal  ESTIMATED BLOOD LOSS:   66ml  OPERATIVE FINDINGS:   COMPLICATIONS: None  DISPOSITION: Stable to recovery room  DESCRIPTION OF PROCEDURE:     The patient was prepped and draped in the supine position and placed under general anesthesia. The bladder was emptied.  A small infraumbilical incision was made and the fascia was identified and incised using Mayo scissors. The peritoneum was carefully identified and entered with care being taken to avoid bowel. A self retaining ring retractor was placed. The right fallopian tube was identified and followed out to its fine fimbriated end and then back approximate half the way. At this point a section of tube was elevated on a Babcock clamp and a Filshie clip was used to completely occlude the tube in a perpendicular manner. The left fallopian tube was similarly identified and again the midportion of the tube was completely occluded using a Filshie clip in a perpendicular manner. Hemostasis of the fallopian tubes as well as the pelvis was noted.  A deep suture through the fascia of 0 Vicryl followed by subcuticular closure of the skin was performed. A long-acting anesthetic was injected.  A dressing was applied. The patient went to the recovery room in stable condition.  Elonda Husky, M.D. 10/05/2019 11:47 AM

## 2019-10-05 NOTE — Progress Notes (Signed)
15 minute call to floor. 

## 2019-10-05 NOTE — Anesthesia Preprocedure Evaluation (Signed)
Anesthesia Evaluation  Patient identified by MRN, date of birth, ID band Patient awake    Reviewed: Allergy & Precautions, H&P , NPO status , Patient's Chart, lab work & pertinent test results, reviewed documented beta blocker date and time   Airway Mallampati: II  TM Distance: >3 FB Neck ROM: full    Dental no notable dental hx. (+) Teeth Intact   Pulmonary neg pulmonary ROS, Current Smoker, former smoker,    Pulmonary exam normal breath sounds clear to auscultation       Cardiovascular Exercise Tolerance: Good negative cardio ROS   Rhythm:regular Rate:Normal     Neuro/Psych PSYCHIATRIC DISORDERS Bipolar Disorder negative neurological ROS  negative psych ROS   GI/Hepatic negative GI ROS, Neg liver ROS,   Endo/Other  negative endocrine ROSdiabetes, Gestational  Renal/GU Renal disease     Musculoskeletal   Abdominal   Peds  Hematology negative hematology ROS (+)   Anesthesia Other Findings   Reproductive/Obstetrics (+) Pregnancy                             Anesthesia Physical Anesthesia Plan  ASA: II  Anesthesia Plan: Epidural   Post-op Pain Management:    Induction:   PONV Risk Score and Plan:   Airway Management Planned:   Additional Equipment:   Intra-op Plan:   Post-operative Plan:   Informed Consent: I have reviewed the patients History and Physical, chart, labs and discussed the procedure including the risks, benefits and alternatives for the proposed anesthesia with the patient or authorized representative who has indicated his/her understanding and acceptance.       Plan Discussed with:   Anesthesia Plan Comments:         Anesthesia Quick Evaluation

## 2019-10-05 NOTE — OB Triage Note (Signed)
Pt. presents to L/D from home with complaints of contractions that began worsening at 2100 on 10/04/19. She rates the pain intermittent, rated 7/10. She reports no bleeding or LOF and positive fetal movement. VSS. Monitors applied and assessing.

## 2019-10-05 NOTE — Anesthesia Preprocedure Evaluation (Signed)
Anesthesia Evaluation  Patient identified by MRN, date of birth, ID band Patient awake    Reviewed: Allergy & Precautions, H&P , NPO status , Patient's Chart, lab work & pertinent test results, reviewed documented beta blocker date and time   Airway Mallampati: II   Neck ROM: full    Dental  (+) Poor Dentition   Pulmonary neg pulmonary ROS, former smoker,    Pulmonary exam normal        Cardiovascular Exercise Tolerance: Good negative cardio ROS Normal cardiovascular exam Rhythm:regular Rate:Normal     Neuro/Psych PSYCHIATRIC DISORDERS Bipolar Disorder negative neurological ROS     GI/Hepatic negative GI ROS, Neg liver ROS,   Endo/Other  negative endocrine ROS  Renal/GU Renal disease  negative genitourinary   Musculoskeletal   Abdominal   Peds  Hematology negative hematology ROS (+)   Anesthesia Other Findings Past Medical History: No date: Bipolar 1 disorder (HCC) No date: Borderline personality disorder (HCC) No date: Cochlear implant in place No date: Hearing loss No date: Interstitial cystitis No date: Mental disorder     Comment:  Borderline personalities disorder 2013: Oliguria     Comment:  cystoscopy, hydrodilation under anesthesia No date: Polysubstance abuse (HCC) No date: Vaginal Pap smear, abnormal Past Surgical History: 04/04/2011: Calibration of urethra with dilation of the urethra under  anesthesia     Comment:  Dr. Rosezetta Schlatter 10/29/2010: CESAREAN SECTION     Comment:  Procedure: CESAREAN SECTION;  Surgeon: Lazaro Arms,               MD;  Location: [facility];  Service: Gynecology;; No date: CESAREAN SECTION No date: COCHLEAR IMPLANT No date: COCHLEAR IMPLANT 04/04/2011: cystoscopy, hydrodilation under anesthesia BMI    Body Mass Index: 34.96 kg/m     Reproductive/Obstetrics negative OB ROS                             Anesthesia Physical Anesthesia  Plan  ASA: III and emergent  Anesthesia Plan: General   Post-op Pain Management:    Induction:   PONV Risk Score and Plan:   Airway Management Planned:   Additional Equipment:   Intra-op Plan:   Post-operative Plan:   Informed Consent: I have reviewed the patients History and Physical, chart, labs and discussed the procedure including the risks, benefits and alternatives for the proposed anesthesia with the patient or authorized representative who has indicated his/her understanding and acceptance.     Dental Advisory Given  Plan Discussed with: CRNA  Anesthesia Plan Comments:         Anesthesia Quick Evaluation

## 2019-10-06 ENCOUNTER — Encounter: Payer: Self-pay | Admitting: Obstetrics and Gynecology

## 2019-10-06 LAB — CBC
HCT: 31.1 % — ABNORMAL LOW (ref 36.0–46.0)
Hemoglobin: 10.8 g/dL — ABNORMAL LOW (ref 12.0–15.0)
MCH: 33.1 pg (ref 26.0–34.0)
MCHC: 34.7 g/dL (ref 30.0–36.0)
MCV: 95.4 fL (ref 80.0–100.0)
Platelets: 125 10*3/uL — ABNORMAL LOW (ref 150–400)
RBC: 3.26 MIL/uL — ABNORMAL LOW (ref 3.87–5.11)
RDW: 14.1 % (ref 11.5–15.5)
WBC: 11.2 10*3/uL — ABNORMAL HIGH (ref 4.0–10.5)
nRBC: 0 % (ref 0.0–0.2)

## 2019-10-06 MED ORDER — FERROUS SULFATE 325 (65 FE) MG PO TABS
325.0000 mg | ORAL_TABLET | Freq: Every day | ORAL | Status: DC
  Administered 2019-10-07 – 2019-10-08 (×2): 325 mg via ORAL
  Filled 2019-10-06 (×2): qty 1

## 2019-10-06 NOTE — Lactation Note (Addendum)
This note was copied from a baby's chart. Lactation Consultation Note  Patient Name: Girl Deija Buhrman [id number] Date: 10/06/2019   Mom is exhausted, but willing to do whatever is necessary to decrease Jeannie's jaundice.  She is getting triple phototherapy.  Mom's breasts are a little fuller, but not engorged.  She has a small red crack at the top of her left nipple.  She said it was sore at first, but has improved with the comfort gels.  Mom has copious amounts of colostrum, but is not comfortable with hand expression.  She has been willing to breast feed on demand with cues and to pump.  Symphony set up in room and reviewed breast massage, pumping, collection, storage, cleaning, labeling and handling of expressed milk.  Mom thinks she still has her Medela DEBP at home.  Mom pumped 12 ml the first pumping and 14 ml the second pumping.  Mom chose to bottle feed the expressed milk using a slow flow nipple.  Explained potential risks of nipple confusion.  Jeannie went back to the breast without difficulty after she had the bottle of breast milk.  Hand out given on what to expect with feeding the first 4 days of life and reviewed normal newborn stomach size, feeding cues, supply and demand, adequate intake and out put, normal course of lactation and routine newborn feeding patterns.  Lactation [employer] given and reviewed.  Lactation name and number has been written on white board and encouraged to call with any questions, concerns or assistance.   Maternal Data    Feeding    LATCH Score                   Interventions    Lactation Tools Discussed/Used     Consult Status      Louis Meckel 10/06/2019, 9:38 PM

## 2019-10-06 NOTE — Progress Notes (Signed)
Patient ID: Tammy Snyder, female   DOB: [birth year], 29 y.o.   MRN: [id number]  Post Partum Day # 1, s/p spontaneous vaginal birth after cesarean section, Rh positive, GBS negative, hearing loss, positive depression screen, breastfeeding  Subjective:  Patient sitting in bed, using double electric breast pump. Lactation consultant at bedside.   Reports "c-section like abdominal pain" since tubal. Positive flatus.   Denies difficulty breathing or respiratory distress, chest pain, dysuria, and leg pain or swelling.   Objective: Temp:  [98 F (36.7 C)-98.7 F (37.1 C)] 98.7 F (37.1 C) (09/19 1525) Pulse Rate:  [73-79] 76 (09/19 1525) Resp:  [16-20] 18 (09/19 1525) BP: (110-114)/(60-69) 110/60 (09/19 1525) SpO2:  [98 %-100 %] 100 % (09/19 1525)  Physical Exam:   General: alert and cooperative   Lungs: clear to auscultation bilaterally  Breasts: normal appearance, no masses or tenderness  Heart: normal apical impulse  Abdomen: soft, non-tender; bowel sounds normal; no masses,  no organomegaly  Pelvis: Lochia: appropriate, Uterine Fundus: firm  Extremities: DVT Evaluation: No evidence of DVT seen on physical exam.  Recent Labs    10/05/19 0318 10/06/19 0525  HGB 12.2 10.8*  HCT 33.1* 31.1*   Edinburgh Postnatal Depression Scale Screening Tool 10/05/2019 10/05/2019 10/05/2019 10/06/2018 10/06/2018  I have been able to laugh and see the funny side of things. 0 (No Data) (No Data) 0 (No Data)  I have looked forward with enjoyment to things. 0 - - 0 -  I have blamed myself unnecessarily when things went wrong. 3 - - 1 -  I have been anxious or worried for no good reason. 3 - - 0 -  I have felt scared or panicky for no good reason. 2 - - 1 -  Things have been getting on top of me. 2 - - 0 -  I have been so unhappy that I have had difficulty sleeping. 2 - - 0 -  I have felt sad or miserable. 1 - - 0 -  I have been so unhappy that I have been crying. 2 - - 0 -  The thought  of harming myself has occurred to me. 1 - - 0 -  Edinburgh Postnatal Depression Scale Total 16 - - 2 -    Assessment:  29 year old female, G4P4, Post Partum Day # 1, s/p spontaneous vaginal birth after cesarean section, Rh positive, GBS negative, hearing loss, positive depression screen  Breastfeeding   Plan:  Iron supplementation orders.   Will change dose of pain medication.   Routine postpartum care and education.   Reviewed red flag symptoms and when to call.   Plan discharge tomorrow or sooner if needed.    LOS: 1 day   Gunnar Bulla, CNM [facility], [facility] 10/06/2019 5:28 PM

## 2019-10-06 NOTE — TOC Initial Note (Signed)
Transition of Care [facility]) - Initial/Assessment Note    Patient Details  Name: Tammy Snyder MRN: [id number] Date of Birth: [birth year]  Transition of Care [facility]) CM/SW Contact:    Marina Goodell Phone Number: [phone] 10/06/2019, 3:34 PM  Clinical Narrative:                  CSW spoke with patient about support systems and current disposition. Patient she lives with her spouse and had a lot of familial support.  Patient has four children, 8, 6, 1 and new born. Patient stated she is currently on disability and is able to stay home with her children.  Patient has PCP w/ [facility] and will begin treatment with a psychiatrist and counselor.  Patient states she has been diagnosed with bipolar disorder and depression.  Patient is currently active with [facility] and Medicaid. CSw will send resource list for patient.  Expected Discharge Plan: Home/Self Care Barriers to Discharge: No Barriers Identified   Patient Goals and CMS Choice Patient states their goals for this hospitalization and ongoing recovery are:: Patient states she is still in a lot of pain and discomfort and "I won't leave without my baby"      Expected Discharge Plan and Services Expected Discharge Plan: Home/Self Care In-house Referral: Clinical Social Work     Living arrangements for the past 2 months: Single Family Home                                      Prior Living Arrangements/Services Living arrangements for the past 2 months: Single Family Home Lives with:: Spouse, Minor Children Patient language and need for interpreter reviewed:: Yes Do you feel safe going back to the place where you live?: Yes      Need for Family Participation in Patient Care: Yes (Comment) Care giver support system in place?: Yes (comment)   Criminal Activity/Legal Involvement Pertinent to Current Situation/Hospitalization: No - Comment as needed  Activities of Daily Living Home Assistive Devices/Equipment:  None ADL Screening (condition at time of admission) Patient's cognitive ability adequate to safely complete daily activities?: Yes Is the patient deaf or have difficulty hearing?: Yes Does the patient have difficulty seeing, even when wearing glasses/contacts?: No Does the patient have difficulty concentrating, remembering, or making decisions?: No Patient able to express need for assistance with ADLs?: Yes Does the patient have difficulty dressing or bathing?: No Independently performs ADLs?: Yes (appropriate for developmental age) Does the patient have difficulty walking or climbing stairs?: No Weakness of Legs: None Weakness of Arms/Hands: None  Permission Sought/Granted      Share Information with NAME: Jean-CArlo Toleto-Villanueva     Permission granted to share info w Relationship: Spouse  Permission granted to share info w Contact Information: [phone]  Emotional Assessment Appearance:: Appears younger than stated age Attitude/Demeanor/Rapport: Sedated Affect (typically observed): Calm, Depressed Orientation: : Oriented to Situation, Oriented to  Time, Oriented to Place, Oriented to Self Alcohol / Substance Use: Not Applicable Psych Involvement: No (comment)  Admission diagnosis:  Normal labor [O80, Z37.9] Patient Active Problem List   Diagnosis Date Noted  . Normal labor 10/05/2019  . False labor after 37 weeks of gestation without delivery   . Labor and delivery, indication for care 07/04/2019  . Abdominal trauma   . Hypokalemia 05/06/2019  . Short interval between pregnancies affecting pregnancy, antepartum 05/06/2019  . Back pain affecting  pregnancy in second trimester 05/06/2019  . Pregnancy related hip pain in second trimester, antepartum 05/06/2019  . Pregnancy related fatigue in second trimester 05/06/2019  . Acute pyelonephritis 04/28/2019  . Vaginal birth after cesarean 10/05/2018  . History of hypokalemia 09/13/2018  . Pregnancy 08/03/2018  .  Palpitations 07/04/2018  . Orthostatic lightheadedness 07/04/2018  . Syncope and collapse 07/03/2018  . History of chlamydia 07/02/2018  . Patient desires vaginal birth after cesarean section (VBAC) 07/02/2018  . Hearing loss 03/06/2018  . Previous cesarean section 03/06/2018  . History of VBAC 03/06/2018   PCP:  Martie Round, NP Pharmacy:   [facility] - [place], [place] - [address] [place] [address] Phone: [phone] Fax: [phone]  [facility] [address], [place] - [address] [place] [address] Phone: [phone] Fax: [phone]  [id number] - [place], [place] - [address] AT [facility] [address] [place] [address] Phone: [phone] Fax: [phone]     Social Determinants of Health (SDOH) Interventions    Readmission Risk Interventions No flowsheet data found.

## 2019-10-06 NOTE — Anesthesia Postprocedure Evaluation (Signed)
Anesthesia Post Note  Patient: Tammy Snyder  Procedure(s) Performed: AN AD HOC LABOR EPIDURAL  Patient location during evaluation: Mother Baby Anesthesia Type: Epidural Level of consciousness: awake and alert Pain management: pain level controlled Vital Signs Assessment: post-procedure vital signs reviewed and stable Respiratory status: spontaneous breathing, nonlabored ventilation and respiratory function stable Cardiovascular status: stable Postop Assessment: no headache, no backache, patient able to bend at knees and able to ambulate Anesthetic complications: no   No complications documented.   Last Vitals:  Vitals:   10/06/19 0346 10/06/19 0736  BP: 113/69 111/69  Pulse: 73 77  Resp: 20 20  Temp: 36.8 C 36.7 C  SpO2: 98% 100%    Last Pain:  Vitals:   10/06/19 0750  TempSrc:   PainSc: 4                  Cleda Mccreedy Bernestine Holsapple

## 2019-10-06 NOTE — Anesthesia Postprocedure Evaluation (Signed)
Anesthesia Post Note  Patient: Tammy Snyder  Procedure(s) Performed: POST PARTUM TUBAL LIGATION (Bilateral Abdomen)  Patient location during evaluation: Mother Baby Anesthesia Type: General Level of consciousness: awake and alert Pain management: pain level controlled Vital Signs Assessment: post-procedure vital signs reviewed and stable Respiratory status: spontaneous breathing, nonlabored ventilation and respiratory function stable Cardiovascular status: blood pressure returned to baseline and stable Postop Assessment: no apparent nausea or vomiting Anesthetic complications: no   No complications documented.   Last Vitals:  Vitals:   10/06/19 0346 10/06/19 0736  BP: 113/69 111/69  Pulse: 73 77  Resp: 20 20  Temp: 36.8 C 36.7 C  SpO2: 98% 100%    Last Pain:  Vitals:   10/06/19 0750  TempSrc:   PainSc: 4                  Cleda Mccreedy Seferino Oscar

## 2019-10-07 MED ORDER — HYDROCORTISONE ACETATE 25 MG RE SUPP
25.0000 mg | Freq: Two times a day (BID) | RECTAL | Status: DC
  Administered 2019-10-07 – 2019-10-08 (×2): 25 mg via RECTAL
  Filled 2019-10-07 (×3): qty 1

## 2019-10-07 MED ORDER — TRAMADOL HCL 50 MG PO TABS
50.0000 mg | ORAL_TABLET | Freq: Four times a day (QID) | ORAL | Status: DC | PRN
  Administered 2019-10-07: 50 mg via ORAL
  Filled 2019-10-07: qty 1

## 2019-10-07 MED ORDER — ARIPIPRAZOLE 2 MG PO TABS
2.0000 mg | ORAL_TABLET | Freq: Every day | ORAL | Status: DC
  Administered 2019-10-07: 2 mg via ORAL
  Filled 2019-10-07 (×2): qty 1

## 2019-10-07 MED ORDER — LIDOCAINE 5 % EX PTCH
1.0000 | MEDICATED_PATCH | CUTANEOUS | Status: DC
  Administered 2019-10-07: 1 via TRANSDERMAL
  Filled 2019-10-07: qty 1

## 2019-10-07 NOTE — Progress Notes (Signed)
Patient ID: Tammy Snyder, female   DOB: 1990-06-04, 29 y.o.   MRN: [id number]  Post Partum Day # 2, s/p s/p spontaneous vaginal birth after cesarean section, Rh positive, GBS negative, hearing loss, positive depression screen, breastfeeding/pumping  Subjective:  In room to see patient. Sitting in bed, crying while nursing infant. Reports abdominal bloating and pain. Unrelieved by current measures, 7-8/10.  Hemorrhoid worse after taking small stool today.   Questions starting medications for postpartum depression with history of bi-polar. Previously on Depakote and felt like "zombie". Wishes to start Abilify. No SI/HI.   Denies difficulty breathing or respiratory distress, chest pain, dysuria, and leg pain or swelling.  Objective:  Temp:  [98 F (36.7 C)-98.7 F (37.1 C)] 98.6 F (37 C) (09/20 1608) Pulse Rate:  [85-90] 90 (09/20 1608) Resp:  [18-20] 20 (09/20 1608) BP: (106-122)/(62-73) 122/71 (09/20 1608) SpO2:  [99 %] 99 % (09/20 0736)  Physical Exam:   General: alert and cooperative   Lungs: clear to auscultation bilaterally  Breasts: deferred, no complaints  Heart: normal apical impulse  Abdomen: abnormal findings:  distended and tender to touch  Pelvis: Lochia: appropriate, Uterine Fundus: firm  Extremities: DVT Evaluation: No evidence of DVT seen on physical exam.  Recent Labs    10/05/19 0318 10/06/19 0525  HGB 12.2 10.8*  HCT 33.1* 31.1*   Edinburgh Postnatal Depression Scale Screening Tool 10/05/2019 10/05/2019 10/05/2019 10/06/2018 10/06/2018  I have been able to laugh and see the funny side of things. 0 (No Data) (No Data) 0 (No Data)  I have looked forward with enjoyment to things. 0 - - 0 -  I have blamed myself unnecessarily when things went wrong. 3 - - 1 -  I have been anxious or worried for no good reason. 3 - - 0 -  I have felt scared or panicky for no good reason. 2 - - 1 -  Things have been getting on top of me. 2 - - 0 -  I have been so  unhappy that I have had difficulty sleeping. 2 - - 0 -  I have felt sad or miserable. 1 - - 0 -  I have been so unhappy that I have been crying. 2 - - 0 -  The thought of harming myself has occurred to me. 1 - - 0 -  Edinburgh Postnatal Depression Scale Total 16 - - 2 -     Assessment:  29 year old G77P4, s/p spontaneous vaginal birth after cesarean section, Rh positive, GBS negative, hearing loss, positive depression screen, Post operative pain after bilateral tubal ligation  Breastfeeding/Pumping-Infant under Bililights  Plan:  Dr. Valentino Saxon consulted. Delay discharge until morning.   Rx Ultram, Lidocaine patch, Anusol, and Abilify; see orders.   Encouraged use of sitz bath and abdominal binder.   Reviewed red flag symptoms and when to call.   Patient agreeable with plan of care and delayed discharge.   LOS: 2 days   Gunnar Bulla, CNM [facility] 10/07/2019 6:23 PM

## 2019-10-07 NOTE — Lactation Note (Signed)
This note was copied from a baby's chart. Lactation Consultation Note  Patient Name: Tammy Snyder FUXNA'T Date: 10/07/2019   Tammy Snyder has been on triple phototherapy for hyberbilirubinemia.  Phototherapy was discontinued after the 17:25 pm bili draw.  Another bilirubin will be drawn in am to see if there is a rebound.  Mom's mature milk is transitioning in and her breasts are full.  She is mostly pumping and bottlefeeding with a slow flow nipple, but Tammy Snyder is still latching to the breast in between. Encouraged frequent draining of the breast by breast feeding and/or pumping to continue to decrease bilirubin levels, ensure a plentiful milk supply and prevent engorgement.   Mom plans to do more frequent breast feeding tonight.  Lactation name and number has been written on white board and encouraged to call with any questions, concerns or assistance.    Maternal Data    Feeding Feeding Type: Bottle Fed - Breast Milk  LATCH Score                   Interventions    Lactation Tools Discussed/Used     Consult Status      Louis Meckel 10/07/2019, 10:30 PM

## 2019-10-08 ENCOUNTER — Telehealth: Payer: Self-pay | Admitting: Certified Nurse Midwife

## 2019-10-08 DIAGNOSIS — Z1331 Encounter for screening for depression: Secondary | ICD-10-CM

## 2019-10-08 MED ORDER — SENNOSIDES-DOCUSATE SODIUM 8.6-50 MG PO TABS
2.0000 | ORAL_TABLET | ORAL | 0 refills | Status: AC
Start: 2019-10-08 — End: ?

## 2019-10-08 MED ORDER — DOCUSATE SODIUM 100 MG PO CAPS: 100.0000 mg | ORAL_CAPSULE | Freq: Two times a day (BID) | ORAL | 0 refills | Status: AC

## 2019-10-08 MED ORDER — ARIPIPRAZOLE 2 MG PO TABS
2.0000 mg | ORAL_TABLET | Freq: Every day | ORAL | 0 refills | Status: AC
Start: 2019-10-08 — End: ?

## 2019-10-08 MED ORDER — WITCH HAZEL-GLYCERIN EX PADS: 1.0000 "application " | MEDICATED_PAD | CUTANEOUS | 12 refills | Status: AC | PRN

## 2019-10-08 MED ORDER — HYDROCORTISONE ACETATE 25 MG RE SUPP: 25.0000 mg | Freq: Two times a day (BID) | RECTAL | 0 refills | Status: AC

## 2019-10-08 MED ORDER — OXYCODONE HCL 10 MG PO TABS
10.0000 mg | ORAL_TABLET | ORAL | 0 refills | Status: DC | PRN
Start: 2019-10-08 — End: 2019-10-21

## 2019-10-08 MED ORDER — SIMETHICONE 80 MG PO CHEW: 80.0000 mg | CHEWABLE_TABLET | ORAL | 0 refills | Status: AC | PRN

## 2019-10-08 MED ORDER — LIDOCAINE 5 % EX PTCH: 1.0000 | MEDICATED_PATCH | CUTANEOUS | 0 refills | Status: AC

## 2019-10-08 MED ORDER — BENZOCAINE-MENTHOL 20-0.5 % EX AERO: 1.0000 "application " | INHALATION_SPRAY | CUTANEOUS | 1 refills | Status: AC | PRN

## 2019-10-08 MED ORDER — DIBUCAINE (PERIANAL) 1 % EX OINT: 1.0000 "application " | TOPICAL_OINTMENT | CUTANEOUS | 1 refills | Status: AC | PRN

## 2019-10-08 MED ORDER — COCONUT OIL OIL: 1.0000 "application " | TOPICAL_OIL | 0 refills | Status: AC | PRN

## 2019-10-08 MED ORDER — FERROUS SULFATE 325 (65 FE) MG PO TABS
325.0000 mg | ORAL_TABLET | Freq: Every day | ORAL | 3 refills | Status: AC
Start: 2019-10-09 — End: ?

## 2019-10-08 NOTE — Discharge Instructions (Signed)

## 2019-10-08 NOTE — Progress Notes (Signed)
Patient discharged home with infant. Discharge instructions and prescriptions given and reviewed with patient. Patient verbalized understanding. Will be escorted out by auxillary.  

## 2019-10-08 NOTE — Discharge Summary (Signed)
Obstetric Discharge Summary  Patient ID: Tammy Snyder MRN: [id number] DOB/AGE: [birth year] 29 y.o.   Date of Admission: 10/05/2019  Date of Discharge: 10/08/2019 10/08/19   Admitting Diagnosis: Onset of Labor at [redacted]w[redacted]d  Secondary Diagnosis:   Patient Active Problem List   Diagnosis Date Noted  . Positive depression screening 10/08/2019  . Normal labor 10/05/2019  . False labor after 37 weeks of gestation without delivery   . Labor and delivery, indication for care 07/04/2019  . Abdominal trauma   . Hypokalemia 05/06/2019  . Short interval between pregnancies affecting pregnancy, antepartum 05/06/2019  . Back pain affecting pregnancy in second trimester 05/06/2019  . Pregnancy related hip pain in second trimester, antepartum 05/06/2019  . Pregnancy related fatigue in second trimester 05/06/2019  . Acute pyelonephritis 04/28/2019  . Vaginal birth after cesarean 10/05/2018  . History of hypokalemia 09/13/2018  . Pregnancy 08/03/2018  . Palpitations 07/04/2018  . Orthostatic lightheadedness 07/04/2018  . Syncope and collapse 07/03/2018  . History of chlamydia 07/02/2018  . Patient desires vaginal birth after cesarean section (VBAC) 07/02/2018  . Hearing loss 03/06/2018  . Previous cesarean section 03/06/2018  . History of VBAC 03/06/2018    Mode of Delivery: normal spontaneous vaginal delivery     Discharge Diagnosis: No other diagnosis   Intrapartum Procedures: Atificial rupture of membranes and epidural   Post partum procedures: postpartum tubal ligation  Complications: Positive depression screen   Brief Hospital Course   Tammy Snyder is a [id number] who had a SVD on 10/05/2019;  for further details, please refer to the delivey sumarry.  Patient's postpartum course was complicated by post operative pain and positive depression screen requiring medication management. By time of discharge on PPD#3, her pain was controlled on oral pain medications; she had  appropriate lochia and was ambulating, voiding without difficulty and tolerating regular diet.  She was deemed stable for discharge to home with close follow up. No SI/HI.   Labs: CBC Latest Ref Rng & Units 10/06/2019 10/05/2019 07/23/2019  WBC 4.0 - 10.5 K/uL 11.2(H) 11.7(H) 9.4  Hemoglobin 12.0 - 15.0 g/dL 10.8(L) 12.2 11.0(L)  Hematocrit 36 - 46 % 31.1(L) 33.1(L) 32.1(L)  Platelets 150 - 400 K/uL 125(L) 148(L) 154   O POS  Physical exam:   Temp:  [98 F (36.7 C)] 98 F (36.7 C) (09/21 0735) Pulse Rate:  [77] 77 (09/21 0735) Resp:  [18] 18 (09/21 0735) BP: (109)/(82) 109/82 (09/21 0735) SpO2:  [99 %] 99 % (09/21 0735)  General: alert and no distress  Lochia: appropriate  Abdomen: soft, NT; dressing to umbilicus clean, dry, intact  Uterine Fundus: firm  Extremities: No evidence of DVT seen on physical exam. No lower extremity edema.  Edinburgh Postnatal Depression Scale Screening Tool 10/05/2019 10/05/2019 10/05/2019 10/06/2018 10/06/2018  I have been able to laugh and see the funny side of things. 0 (No Data) (No Data) 0 (No Data)  I have looked forward with enjoyment to things. 0 - - 0 -  I have blamed myself unnecessarily when things went wrong. 3 - - 1 -  I have been anxious or worried for no good reason. 3 - - 0 -  I have felt scared or panicky for no good reason. 2 - - 1 -  Things have been getting on top of me. 2 - - 0 -  I have been so unhappy that I have had difficulty sleeping. 2 - - 0 -  I have felt sad or miserable. 1 - -  0 -  I have been so unhappy that I have been crying. 2 - - 0 -  The thought of harming myself has occurred to me. 1 - - 0 -  Edinburgh Postnatal Depression Scale Total 16 - - 2 -     Discharge Instructions: Per After Visit Summary.  Activity: Advance as tolerated. Pelvic rest for 6 weeks.  Also refer to After Visit Summary  Diet: Regular  Medications: Allergies as of 10/08/2019      Reactions   Latex Rash   Nsaids Rash   Tylenol  [acetaminophen] Itching, Rash      Medication List    STOP taking these medications   hydrOXYzine 25 MG tablet Commonly known as: ATARAX/VISTARIL   nitrofurantoin (macrocrystal-monohydrate) 100 MG capsule Commonly known as: MACROBID     TAKE these medications   ARIPiprazole 2 MG tablet Commonly known as: ABILIFY Take 1 tablet (2 mg total) by mouth daily.   benzocaine-Menthol 20-0.5 % Aero Commonly known as: DERMOPLAST Apply 1 application topically as needed for irritation (perineal discomfort).   CITRANATAL DHA PO Take 1 tablet by mouth daily.   coconut oil Oil Apply 1 application topically as needed.   dibucaine 1 % Oint Commonly known as: NUPERCAINAL Place 1 application rectally as needed for hemorrhoids.   docusate sodium 100 MG capsule Commonly known as: COLACE Take 1 capsule (100 mg total) by mouth 2 (two) times daily.   ferrous sulfate 325 (65 FE) MG tablet Take 1 tablet (325 mg total) by mouth daily with breakfast. Start taking on: October 09, 2019   hydrocortisone 25 MG suppository Commonly known as: ANUSOL-HC Place 1 suppository (25 mg total) rectally 2 (two) times daily.   lidocaine 5 % Commonly known as: LIDODERM Place 1 patch onto the skin daily. Remove and Discard patch within 12 hours or as directed by CNM   Oxycodone HCl 10 MG Tabs Take 1 tablet (10 mg total) by mouth every 4 (four) hours as needed (pain scale > 7).   senna-docusate 8.6-50 MG tablet Commonly known as: Senokot-S Take 2 tablets by mouth daily.   simethicone 80 MG chewable tablet Commonly known as: MYLICON Chew 1 tablet (80 mg total) by mouth as needed for flatulence.   witch hazel-glycerin pad Commonly known as: TUCKS Apply 1 application topically as needed for hemorrhoids.            Discharge Care Instructions  (From admission, onward)         Start     Ordered   10/08/19 0000  If the dressing is still on your incision site when you go home, remove it on the  third day after your surgery date. Remove dressing if it begins to fall off, or if it is dirty or damaged before the third day.        10/08/19 0850         Outpatient follow up:   Follow-up Information    Gunnar Bulla, CNM Follow up.   Specialties: Certified Nurse Midwife, Obstetrics and Gynecology, Radiology Why: Someone from the office will call you to schedule a two (2) week postpartum TELEVIST and six (6) week postpartum visit in office Contact information: [address] [place] [address] [phone]              Postpartum contraception: bilateral tubal ligation  Discharged Condition: stable  Discharged to: home   Newborn Data:  Disposition:home with mother  Apgars: APGAR (1 MIN): 8  APGAR (5 MINS): 9    Baby Feeding: Breast   Serafina Royals, CNM  [facility], [facility]

## 2019-10-08 NOTE — Telephone Encounter (Signed)
Marcelino Duster send message stated to contact the pt to schedule her next 2 appointment. The pt needs a 2 week televisit and a 6 week ppv with Marcelino Duster. I  [facility] informing the pt to call the office to schedule the appointments

## 2019-10-09 ENCOUNTER — Other Ambulatory Visit: Payer: Medicaid Other

## 2019-10-09 ENCOUNTER — Encounter: Payer: Medicaid Other | Admitting: Certified Nurse Midwife

## 2019-10-21 ENCOUNTER — Ambulatory Visit (INDEPENDENT_AMBULATORY_CARE_PROVIDER_SITE_OTHER): Payer: Medicaid Other | Admitting: Certified Nurse Midwife

## 2019-10-21 ENCOUNTER — Telehealth: Payer: Self-pay

## 2019-10-21 ENCOUNTER — Other Ambulatory Visit: Payer: Self-pay

## 2019-10-21 ENCOUNTER — Encounter: Payer: Self-pay | Admitting: Certified Nurse Midwife

## 2019-10-21 DIAGNOSIS — G8918 Other acute postprocedural pain: Secondary | ICD-10-CM

## 2019-10-21 DIAGNOSIS — Z1331 Encounter for screening for depression: Secondary | ICD-10-CM

## 2019-10-21 MED ORDER — OXYCODONE HCL 10 MG PO TABS
10.0000 mg | ORAL_TABLET | ORAL | 0 refills | Status: AC | PRN
Start: 2019-10-21 — End: ?

## 2019-10-21 NOTE — Telephone Encounter (Signed)
Called patient to check in for televisit. Unable to reach patient, LVM regarding televisit.

## 2019-10-27 NOTE — Progress Notes (Signed)
Virtual Visit via Telephone Note  I connected with Tammy Snyder on 10/27/19 at  4:30 PM EDT by telephone and verified that I am speaking with the correct person using two identifiers.  Location:  Patient: Tammy Snyder (home)  Provider: Serafina Royals, CNM ([facility], [facility])   I discussed the limitations, risks, security and privacy concerns of performing an evaluation and management service by telephone and the availability of in person appointments. I also discussed with the patient that there may be a patient responsible charge related to this service. The patient expressed understanding and agreed to proceed.   History of Present Illness:  Patient is two (2) weeks postpartum spontaneous vaginal birth after cesarean section with immediate postpartum bilateral tubal ligation.    Patient doing well except for continued abdominal pain and hemorrhoid pain, 7-9/10 on 0-10 scale. Pain increases with walking. Using binder helps, but requests additional pain medication.   Breastfeeding without difficulty.   Denies fever, redness, swelling, and other signs of infection.   History significant for positive postpartum depression screen prior to hospital discharge. Taking Abilify daily.    Observations/Objective:  Depression screen [facility] 2/9 10/21/2019 08/19/2019  Decreased Interest 0 2  Down, Depressed, Hopeless 0 3  PHQ - 2 Score 0 5  Altered sleeping 0 2  Tired, decreased energy 1 1  Change in appetite 0 3  Feeling bad or failure about yourself  0 3  Trouble concentrating 0 2  Moving slowly or fidgety/restless 0 1  Suicidal thoughts 0 0  PHQ-9 Score 1 17  Difficult doing work/chores - Very difficult    Assessment and Plan:  Postpartum care and examination Lactating mother Depression screening negative Postoperative pain  Follow Up Instructions:  Rx Oxycodone, see orders.   Continue Abilify as prescribed.   Reviewed red flag symptoms and when to  call.   RTC as previously scheduled or sooner if needed.    I discussed the assessment and treatment plan with the patient. The patient was provided an opportunity to ask questions and all were answered. The patient agreed with the plan and demonstrated an understanding of the instructions.   The patient was advised to call back or seek an in-person evaluation if the symptoms worsen or if the condition fails to improve as anticipated.  I provided 7 minutes of non-face-to-face time during this encounter.   Serafina Royals, CNM [facility], [facility]

## 2019-11-25 ENCOUNTER — Encounter: Payer: Medicaid Other | Admitting: Certified Nurse Midwife

## 2019-11-25 NOTE — Progress Notes (Deleted)
Pt present for 6 week post partum visit.

## 2022-03-22 ENCOUNTER — Other Ambulatory Visit: Payer: Self-pay

## 2022-03-22 ENCOUNTER — Emergency Department (HOSPITAL_COMMUNITY)
Admission: EM | Admit: 2022-03-22 | Discharge: 2022-03-22 | Disposition: A | Discharge status: Home / Self Care | Payer: Self-pay | Attending: Emergency Medicine | Admitting: Emergency Medicine

## 2022-03-22 ENCOUNTER — Encounter (HOSPITAL_COMMUNITY): Payer: Self-pay

## 2022-03-22 DIAGNOSIS — R55 Syncope and collapse: Secondary | ICD-10-CM | POA: Insufficient documentation

## 2022-03-22 DIAGNOSIS — Z91040 Latex allergy status: Secondary | ICD-10-CM | POA: Insufficient documentation

## 2022-03-22 DIAGNOSIS — R350 Frequency of micturition: Secondary | ICD-10-CM | POA: Insufficient documentation

## 2022-03-22 LAB — CBC WITH DIFFERENTIAL/PLATELET
Abs Immature Granulocytes: 0.02 10*3/uL (ref 0.00–0.07)
Basophils Absolute: 0 10*3/uL (ref 0.0–0.1)
Basophils Relative: 1 %
Eosinophils Absolute: 0.1 10*3/uL (ref 0.0–0.5)
Eosinophils Relative: 1 %
HCT: 40.8 % (ref 36.0–46.0)
Hemoglobin: 13.4 g/dL (ref 12.0–15.0)
Immature Granulocytes: 0 %
Lymphocytes Relative: 33 %
Lymphs Abs: 2.1 10*3/uL (ref 0.7–4.0)
MCH: 31.8 pg (ref 26.0–34.0)
MCHC: 32.8 g/dL (ref 30.0–36.0)
MCV: 96.9 fL (ref 80.0–100.0)
Monocytes Absolute: 0.5 10*3/uL (ref 0.1–1.0)
Monocytes Relative: 7 %
Neutro Abs: 3.5 10*3/uL (ref 1.7–7.7)
Neutrophils Relative %: 58 %
Platelets: 199 10*3/uL (ref 150–400)
RBC: 4.21 MIL/uL (ref 3.87–5.11)
RDW: 12.2 % (ref 11.5–15.5)
WBC: 6.1 10*3/uL (ref 4.0–10.5)
nRBC: 0 % (ref 0.0–0.2)

## 2022-03-22 LAB — COMPREHENSIVE METABOLIC PANEL
ALT: 25 U/L (ref 0–44)
AST: 28 U/L (ref 15–41)
Albumin: 3.9 g/dL (ref 3.5–5.0)
Alkaline Phosphatase: 73 U/L (ref 38–126)
Anion gap: 11 (ref 5–15)
BUN: 8 mg/dL (ref 6–20)
CO2: 19 mmol/L — ABNORMAL LOW (ref 22–32)
Calcium: 8.9 mg/dL (ref 8.9–10.3)
Chloride: 107 mmol/L (ref 98–111)
Creatinine, Ser: 0.57 mg/dL (ref 0.44–1.00)
GFR, Estimated: >60 mL/min (ref >60)
Glucose, Bld: 95 mg/dL (ref 70–99)
Potassium: 4.3 mmol/L (ref 3.5–5.1)
Sodium: 137 mmol/L (ref 135–145)
Total Bilirubin: 0.9 mg/dL (ref 0.3–1.2)
Total Protein: 6.6 g/dL (ref 6.5–8.1)

## 2022-03-22 LAB — I-STAT BETA HCG BLOOD, ED (MC, WL, AP ONLY): I-stat hCG, quantitative: 17.5 m[IU]/mL — ABNORMAL HIGH (ref <5)

## 2022-03-22 LAB — T4, FREE: Free T4: 0.81 ng/dL (ref 0.61–1.12)

## 2022-03-22 LAB — URINALYSIS, ROUTINE W REFLEX MICROSCOPIC
Bilirubin Urine: NEGATIVE
Glucose, UA: NEGATIVE mg/dL
Hgb urine dipstick: NEGATIVE
Ketones, ur: NEGATIVE mg/dL
Leukocytes,Ua: NEGATIVE
Nitrite: NEGATIVE
Protein, ur: NEGATIVE mg/dL
Specific Gravity, Urine: 1.008 (ref 1.005–1.030)
pH: 8 (ref 5.0–8.0)

## 2022-03-22 LAB — TSH: TSH: 1.182 u[IU]/mL (ref 0.350–4.500)

## 2022-03-22 LAB — HCG, QUANTITATIVE, PREGNANCY: hCG, Beta Chain, Quant, S: <1 m[IU]/mL (ref <5)

## 2022-03-22 LAB — CBG MONITORING, ED: Glucose-Capillary: 78 mg/dL (ref 70–99)

## 2022-03-22 NOTE — ED Triage Notes (Signed)
Per EMS, Pt, from [facility] (halfway house), presents w/ dry mouth, generalized abdominal discomfort, and lightheadedness x 3 years.  Pain score 8/10.  Pt was recently incarcerated.    Pt sts she was recently restarted on Buspirone and Abilify.    Pt would like to be checked for diabetes.

## 2022-03-22 NOTE — Discharge Instructions (Signed)
Get help right away if: You faint. You have any of these symptoms that may indicate trouble with your heart: Fast or irregular heartbeats (palpitations). Unusual pain in your chest, abdomen, or back. Shortness of breath. You have a seizure. You have a severe headache. You are confused. You have vision problems. You have severe weakness or trouble walking. You are bleeding from your mouth or rectum, or have black or tarry stool. These symptoms may represent a serious problem that is an emergency. Do not wait to see if your symptoms will go away. Get medical help right away. Call your local emergency services (911 in the [place] Do not drive yourself to the hospital.

## 2022-03-22 NOTE — ED Provider Notes (Signed)
[facility] Provider Note   CSN: [id number] Arrival date & time: 03/22/22  1042     History  Chief Complaint  Patient presents with   check for diabetes   Abdominal Pain   Dry Mouth   Dizziness   Near Syncope    Tammy Snyder is a 32 y.o. female with a past medical history of interstitial cystitis, BPD, bipolar disorder, polysubstance abuse, oliguria who presents to the emergency department with complaint of near syncope.  Patient reports that she has had excessive thirst and urination ongoing for several weeks.  She feels at times that she is going to pass out but has not lost consciousness.  This is very frequent.  She denies palpitations, chest pain or shortness of breath.  She was concerned she might have diabetes due to the amount of urination and shortness of breath she has been   Abdominal Pain Dizziness Near Syncope Associated symptoms include abdominal pain.       Home Medications Prior to Admission medications   Medication Sig Start Date End Date Taking? Authorizing Provider  ARIPiprazole (ABILIFY) 2 MG tablet Take 1 tablet (2 mg total) by mouth daily. 10/08/19   Lawhorn, Lara Mulch, CNM  benzocaine-Menthol (DERMOPLAST) 20-0.5 % AERO Apply 1 application topically as needed for irritation (perineal discomfort). Patient not taking: Reported on 10/21/2019 10/08/19   Diona Fanti, CNM  coconut oil OIL Apply 1 application topically as needed. 10/08/19   Lawhorn, Lara Mulch, CNM  dibucaine (NUPERCAINAL) 1 % OINT Place 1 application rectally as needed for hemorrhoids. 10/08/19   Lawhorn, Lara Mulch, CNM  docusate sodium (COLACE) 100 MG capsule Take 1 capsule (100 mg total) by mouth 2 (two) times daily. 10/08/19   Diona Fanti, CNM  ferrous sulfate 325 (65 FE) MG tablet Take 1 tablet (325 mg total) by mouth daily with breakfast. Patient not taking: Reported on 10/21/2019 10/09/19    Diona Fanti, CNM  hydrocortisone (ANUSOL-HC) 25 MG suppository Place 1 suppository (25 mg total) rectally 2 (two) times daily. 10/08/19   Lawhorn, Lara Mulch, CNM  lidocaine (LIDODERM) 5 % Place 1 patch onto the skin daily. Remove and Discard patch within 12 hours or as directed by CNM 10/08/19   Lawhorn, Lara Mulch, CNM  Oxycodone HCl 10 MG TABS Take 1 tablet (10 mg total) by mouth every 4 (four) hours as needed (pain scale > 7). 10/21/19   Lawhorn, Lara Mulch, CNM  Prenat w/o A-FeCbGl-DSS-FA-DHA (CITRANATAL DHA PO) Take 1 tablet by mouth daily.    [provider]  senna-docusate (SENOKOT-S) 8.6-50 MG tablet Take 2 tablets by mouth daily. 10/08/19   Lawhorn, Lara Mulch, CNM  simethicone (MYLICON) 80 MG chewable tablet Chew 1 tablet (80 mg total) by mouth as needed for flatulence. 10/08/19   Diona Fanti, CNM  witch hazel-glycerin (TUCKS) pad Apply 1 application topically as needed for hemorrhoids. 10/08/19   Diona Fanti, CNM      Allergies    Latex, Nsaids, and Tylenol [acetaminophen]    Review of Systems   Review of Systems  Cardiovascular:  Positive for near-syncope.  Gastrointestinal:  Positive for abdominal pain.  Neurological:  Positive for dizziness.    Physical Exam Updated Vital Signs BP 121/70 (BP Location: Right Arm)   Pulse 77   Temp 98.2 F (36.8 C) (Oral)   Resp 16   SpO2 100%  Physical Exam Vitals and nursing note reviewed.  Constitutional:  General: She is not in acute distress.    Appearance: She is well-developed. She is not diaphoretic.  HENT:     Head: Normocephalic and atraumatic.     Right Ear: External ear normal.     Left Ear: External ear normal.     Nose: Nose normal.     Mouth/Throat:     Mouth: Mucous membranes are moist.  Eyes:     General: No scleral icterus.    Conjunctiva/sclera: Conjunctivae normal.  Cardiovascular:     Rate and Rhythm: Normal rate and regular rhythm.      Heart sounds: Normal heart sounds. No murmur heard.    No friction rub. No gallop.  Pulmonary:     Effort: Pulmonary effort is normal. No respiratory distress.     Breath sounds: Normal breath sounds.  Abdominal:     General: Bowel sounds are normal. There is no distension.     Palpations: Abdomen is soft. There is no mass.     Tenderness: There is no abdominal tenderness. There is no guarding.  Musculoskeletal:     Cervical back: Normal range of motion.  Skin:    General: Skin is warm and dry.  Neurological:     Mental Status: She is alert and oriented to person, place, and time.  Psychiatric:        Mood and Affect: Mood is anxious.        Behavior: Behavior normal.     ED Results / Procedures / Treatments   Labs (all labs ordered are listed, but only abnormal results are displayed) Labs Reviewed  COMPREHENSIVE METABOLIC PANEL - Abnormal; Notable for the following components:      Result Value   CO2 19 (*)    All other components within normal limits  URINALYSIS, ROUTINE W REFLEX MICROSCOPIC - Abnormal; Notable for the following components:   APPearance HAZY (*)    All other components within normal limits  I-STAT BETA HCG BLOOD, ED ([facility], [facility], [facility] ONLY) - Abnormal; Notable for the following components:   I-stat hCG, quantitative 17.5 (*)    All other components within normal limits  CBC WITH DIFFERENTIAL/PLATELET  TSH  T4, FREE  HCG, QUANTITATIVE, PREGNANCY  CBG MONITORING, ED    EKG EKG Interpretation  Date/Time:  Tuesday March 22 2022 12:28:29 EST Ventricular Rate:  69 PR Interval:  152 QRS Duration: 78 QT Interval:  390 QTC Calculation: 417 R Axis:   60 Text Interpretation: Normal sinus rhythm Normal ECG No significant change since last tracing Confirmed by Leanord Asal ([id number]) on 03/22/2022 1:33:54 PM  Radiology No results found.  Procedures Procedures    Medications Ordered in ED Medications - No data to display  ED Course/ Medical Decision  Making/ A&P                             Medical Decision Making Patient here with multiple complaints including excessive thirst, urination and near syncope.  Differential includes diabetes, diabetes insipidus, medication side effect.  I have low suspicion for arrhythmia, pulmonary embolus or other emergent cause of her symptoms.  I ordered labs including i-STAT hCG which is likely falsely positive but I have a pending quantitative hCG, CBC shows no abnormalities, CMP also other otherwise unremarkable, urine shows no evidence of infection, TSH and T4 within normal limits.  EKG shows sinus rhythm at a rate of 69.  Currently awaiting follow-up hCG.  Signout given to PA  Sofia at discharge.  Expect she can be discharged to follow-up with outpatient primary care doctor.  Patient is otherwise stable throughout her visit here.  Amount and/or Complexity of Data Reviewed Labs: ordered. ECG/medicine tests: ordered.           Final Clinical Impression(s) / ED Diagnoses Final diagnoses:  Urinary frequency  Near syncope    Rx / DC Orders ED Discharge Orders     None         Margarita Mail, PA-C 03/22/22 1626    Leanord Asal K, DO 03/23/22 1601

## 2022-03-22 NOTE — ED Notes (Addendum)
No pace maker present, pt placed on cardiac monitoring
